# Patient Record
Sex: Male | Born: 1945
Health system: Southern US, Community
[De-identification: ages and names within clinical notes are randomized; demographics above are authoritative.]

## PROBLEM LIST (undated history)

## (undated) DIAGNOSIS — I10 Essential (primary) hypertension: Secondary | ICD-10-CM

## (undated) DIAGNOSIS — F329 Major depressive disorder, single episode, unspecified: Secondary | ICD-10-CM

## (undated) DIAGNOSIS — G43909 Migraine, unspecified, not intractable, without status migrainosus: Secondary | ICD-10-CM

## (undated) DIAGNOSIS — F32A Depression, unspecified: Secondary | ICD-10-CM

## (undated) DIAGNOSIS — B029 Zoster without complications: Secondary | ICD-10-CM

## (undated) DIAGNOSIS — H8109 Meniere's disease, unspecified ear: Secondary | ICD-10-CM

## (undated) DIAGNOSIS — E7800 Pure hypercholesterolemia, unspecified: Secondary | ICD-10-CM

## (undated) HISTORY — DX: Depression, unspecified: F32.A

## (undated) HISTORY — PX: COLONOSCOPY: SHX174

## (undated) HISTORY — DX: Zoster without complications: B02.9

## (undated) HISTORY — DX: Meniere's disease, unspecified ear: H81.09

## (undated) HISTORY — DX: Major depressive disorder, single episode, unspecified: F32.9

## (undated) HISTORY — DX: Migraine, unspecified, not intractable, without status migrainosus: G43.909

---

## 2005-08-23 DIAGNOSIS — G43909 Migraine, unspecified, not intractable, without status migrainosus: Secondary | ICD-10-CM | POA: Insufficient documentation

## 2005-08-23 DIAGNOSIS — F329 Major depressive disorder, single episode, unspecified: Secondary | ICD-10-CM | POA: Insufficient documentation

## 2005-08-23 DIAGNOSIS — J309 Allergic rhinitis, unspecified: Secondary | ICD-10-CM | POA: Insufficient documentation

## 2005-08-23 DIAGNOSIS — N529 Male erectile dysfunction, unspecified: Secondary | ICD-10-CM | POA: Insufficient documentation

## 2005-11-05 ENCOUNTER — Emergency Department: Payer: Self-pay | Admitting: Internal Medicine

## 2006-01-04 DIAGNOSIS — F410 Panic disorder [episodic paroxysmal anxiety] without agoraphobia: Secondary | ICD-10-CM | POA: Insufficient documentation

## 2006-09-25 HISTORY — PX: SIGMOIDOSCOPY: SUR1295

## 2007-04-03 ENCOUNTER — Ambulatory Visit: Payer: Self-pay | Admitting: Family Medicine

## 2008-10-04 DIAGNOSIS — M75100 Unspecified rotator cuff tear or rupture of unspecified shoulder, not specified as traumatic: Secondary | ICD-10-CM | POA: Insufficient documentation

## 2009-08-02 DIAGNOSIS — M76899 Other specified enthesopathies of unspecified lower limb, excluding foot: Secondary | ICD-10-CM | POA: Insufficient documentation

## 2013-08-11 ENCOUNTER — Ambulatory Visit: Payer: Self-pay | Admitting: Gastroenterology

## 2013-08-11 LAB — HM COLONOSCOPY

## 2014-08-24 LAB — BASIC METABOLIC PANEL: Glucose: 89 mg/dL

## 2014-10-19 LAB — LIPID PANEL
Cholesterol: 137 mg/dL (ref 0–200)
HDL: 42 mg/dL (ref 35–70)
LDL Cholesterol: 72 mg/dL
TRIGLYCERIDES: 114 mg/dL (ref 40–160)

## 2014-10-19 LAB — HEPATIC FUNCTION PANEL: ALT: 11 U/L (ref 10–40)

## 2015-04-13 ENCOUNTER — Other Ambulatory Visit: Payer: Self-pay | Admitting: Family Medicine

## 2015-07-16 DIAGNOSIS — B029 Zoster without complications: Secondary | ICD-10-CM | POA: Insufficient documentation

## 2015-07-16 HISTORY — DX: Zoster without complications: B02.9

## 2015-07-19 ENCOUNTER — Encounter: Payer: Self-pay | Admitting: Family Medicine

## 2015-07-19 ENCOUNTER — Ambulatory Visit (INDEPENDENT_AMBULATORY_CARE_PROVIDER_SITE_OTHER): Payer: PPO | Admitting: Family Medicine

## 2015-07-19 VITALS — BP 116/80 | HR 82 | Temp 98.4°F | Resp 16 | Wt 186.0 lb

## 2015-07-19 DIAGNOSIS — Z23 Encounter for immunization: Secondary | ICD-10-CM | POA: Diagnosis not present

## 2015-07-19 DIAGNOSIS — E785 Hyperlipidemia, unspecified: Secondary | ICD-10-CM

## 2015-07-19 DIAGNOSIS — F410 Panic disorder [episodic paroxysmal anxiety] without agoraphobia: Secondary | ICD-10-CM

## 2015-07-19 DIAGNOSIS — F329 Major depressive disorder, single episode, unspecified: Secondary | ICD-10-CM

## 2015-07-19 DIAGNOSIS — Z125 Encounter for screening for malignant neoplasm of prostate: Secondary | ICD-10-CM

## 2015-07-19 DIAGNOSIS — F32A Depression, unspecified: Secondary | ICD-10-CM

## 2015-07-19 DIAGNOSIS — Z0000 Encounter for general adult medical examination without abnormal findings: Secondary | ICD-10-CM

## 2015-07-19 NOTE — Patient Instructions (Signed)
   Please contact your eyecare professional to schedule a routine eye exam  

## 2015-07-19 NOTE — Progress Notes (Signed)
Patient: Cameron King, Male    DOB: 1946/07/27, 69 y.o.   MRN: [id number] Visit Date: 07/19/2015  Today's Provider: Lelon Huh, MD   Chief Complaint  Patient presents with  . Hyperlipidemia  . Depression  . Panic Attack  . Annual Exam   Subjective:    Annual exam TIN ENGRAM is a 69 y.o. male. He feels well. He reports exercising yes/walking. He reports he is sleeping well.  -----------------------------------------------------------    Follow-up for depression and panic attacks He states since increasing dose of paroxetine to 40mg  he has been doing much better, not having any panic attacks, and not feeling depressed. He is handling stress situations well.    Lipid/Cholesterol, Follow-up:   Last seen for this9 months ago.  Management changes since that visit include none. . Last Lipid Panel:    Component Value Date/Time   CHOL 137 10/19/2014   TRIG 114 10/19/2014   HDL 42 10/19/2014   LDLCALC 72 10/19/2014      He reports good compliance with atorvastatin. He is not having side effects.  Current symptoms include none and have been stable. Weight trend: stable Prior visit with dietician: no Current diet: well balanced Current exercise: walking  Wt Readings from Last 3 Encounters:  07/19/15 186 lb (84.369 kg)  10/19/14 183 lb (83.008 kg)    -------------------------------------------------------------------   Review of Systems  Constitutional: Negative.   HENT: Positive for hearing loss.        Right ear  Eyes: Negative.   Respiratory: Negative.   Cardiovascular: Negative.   Gastrointestinal: Positive for diarrhea and blood in stool.       Hemorrhoids   Endocrine: Negative.   Genitourinary: Positive for urgency.       Urgency and leakage has been going on for the last year   Musculoskeletal: Negative.   Skin: Negative.   Allergic/Immunologic: Negative.   Neurological: Negative.   Hematological: Negative.     Psychiatric/Behavioral: The patient is nervous/anxious.     Social History   Social History  . Marital Status: Married    Spouse Name: N/A  . Number of Children: N/A  . Years of Education: N/A   Occupational History  . Unemployed    Social History Main Topics  . Smoking status: Former Research scientist (life sciences)  . Smokeless tobacco: Not on file  . Alcohol Use: No  . Drug Use: Not on file  . Sexual Activity: Not on file   Other Topics Concern  . Not on file   Social History Narrative    Patient Active Problem List   Diagnosis Date Noted  . Enthesopathy of hip 08/02/2009  . Rotator cuff syndrome 10/04/2008  . Chronic infection of sinus 03/30/2008  . HLD (hyperlipidemia) 04/09/2007  . Panic disorder 01/04/2006  . History of tobacco use 11/23/2005  . Allergic rhinitis 08/23/2005  . Depressive disorder 08/23/2005  . Impotence of organic origin 08/23/2005  . Migraine without status migrainosus 08/23/2005    Past Surgical History  Procedure Laterality Date  . Sigmoidoscopy  2008    Dr. Jamal Collin. Normal per patient report    His family history is not on file. He was adopted.    Previous Medications   LORATADINE (CLARITIN) 10 MG TABLET    Take 1 tablet by mouth daily.   PAROXETINE (PAXIL) 40 MG TABLET    TAKE ONE TABLET BY MOUTH ONCE DAILY   PRAVASTATIN (PRAVACHOL) 20 MG TABLET    Take 1 tablet by  mouth at bedtime.   SILDENAFIL (VIAGRA) 100 MG TABLET    Take 1 tablet by mouth daily as needed.   SUMATRIPTAN (IMITREX) 100 MG TABLET    Take 1 tablet by mouth as needed. For headache. Repeat 2nd dose after 2 hours    Patient Care Team: Birdie Sons, MD as PCP - General (Family Medicine) Paulino Bellow, MD (General Surgery)     Objective:   Vitals: BP 116/80 mmHg  Pulse 82  Temp(Src) 98.4 F (36.9 C) (Oral)  Resp 16  Wt 186 lb (84.369 kg)  SpO2 99%  Physical Exam   General Appearance:    Alert, cooperative, no distress, appears stated age, obese  Head:    Normocephalic,  without obvious abnormality, atraumatic  Eyes:    PERRL, conjunctiva/corneas clear, EOM's intact, fundi    benign, both eyes       Ears:    Normal TM's and external ear canals, both ears  Nose:   Nares normal, septum midline, mucosa normal, no drainage   or sinus tenderness  Throat:   Lips, mucosa, and tongue normal; teeth and gums normal  Neck:   Supple, symmetrical, trachea midline, no adenopathy;       thyroid:  No enlargement/tenderness/nodules; no carotid   bruit or JVD  Back:     Symmetric, no curvature, ROM normal, no CVA tenderness  Lungs:     Clear to auscultation bilaterally, respirations unlabored  Chest wall:    No tenderness or deformity  Heart:    Regular rate and rhythm, S1 and S2 normal, no murmur, rub   or gallop  Abdomen:     Soft, non-tender, bowel sounds active all four quadrants,    no masses, no organomegaly  Genitalia:    deferred  Rectal:    deferred  Extremities:   Extremities normal, atraumatic, no cyanosis or edema  Pulses:   2+ and symmetric all extremities  Skin:   Skin color, texture, turgor normal, no rashes or lesions  Lymph nodes:   Cervical, supraclavicular, and axillary nodes normal  Neurologic:   CNII-XII intact. Normal strength, sensation and reflexes      throughout     Activities of Daily Living In your present state of health, do you have any difficulty performing the following activities: 07/19/2015  Hearing? Y  Vision? N  Difficulty concentrating or making decisions? Y  Walking or climbing stairs? Y  Dressing or bathing? N  Doing errands, shopping? N    Fall Risk Assessment Fall Risk  07/19/2015  Falls in the past year? Yes  Number falls in past yr: 2 or more  Injury with Fall? No  He states he twisted his right knee several months ago and since then has had about 5 episodes when his knee gave out while in the process of standing up causing him to fall, but this is improving.    Depression Screen PHQ 2/9 Scores 07/19/2015  PHQ -  2 Score 2    Cognitive Testing - 6-CIT Patient refused   Audit-C Alcohol Use Screening  Question Answer Points  How often do you have alcoholic drink? never 0  On days you do drink alcohol, how many drinks do you typically consume? n/a 0  How oftey will you drink 6 or more in a total? never 0  Total Score:  0   A score of 3 or more in women, and 4 or more in men indicates increased risk for alcohol abuse, EXCEPT if all  of the points are from question 1.     Assessment & Plan:    Annual Physical  Reviewed patient's Family Medical History Reviewed and updated list of patient's medical providers Assessment of cognitive impairment was done Assessed patient's functional ability Established a written schedule for health screening [employer] Completed and Reviewed  Exercise Activities and Dietary recommendations Goals    None      Immunization History  Administered Date(s) Administered  . Pneumococcal Conjugate-13 08/24/2014  . Pneumococcal Polysaccharide-23 05/01/2012  . Td 09/26/2003    Health Maintenance  Topic Date Due  . ZOSTAVAX  01/31/2007  . TETANUS/TDAP  09/25/2013  . INFLUENZA VACCINE  04/26/2015  . COLONOSCOPY  08/11/2018  . Hepatitis C Screening  Completed  . PNA vac Low Risk Adult  Completed      Discussed health benefits of physical activity, and encouraged him to engage in regular exercise appropriate for his age and condition.    ------------------------------------------------------------------------------------------------------------ 1. Annual physical exam   2. HLD (hyperlipidemia) He is tolerating atorvastatin well with no adverse effects.   - Lipid panel  3. Panic disorder Well controlled on paroxetine  4. Depressive disorder Doing well on paroxetine - Renal function panel  5. Need for influenza vaccination  - Flu vaccine HIGH DOSE PF  6. Need for Tdap vaccination  - Tdap vaccine greater than or equal to  7yo IM  7. Prostate cancer screening  - PSA

## 2015-07-20 LAB — RENAL FUNCTION PANEL
ALBUMIN: 4.8 g/dL (ref 3.6–4.8)
BUN/Creatinine Ratio: 14 (ref 10–22)
BUN: 15 mg/dL (ref 8–27)
CO2: 20 mmol/L (ref 18–29)
CREATININE: 1.11 mg/dL (ref 0.76–1.27)
Calcium: 9.8 mg/dL (ref 8.6–10.2)
Chloride: 99 mmol/L (ref 97–106)
GFR calc Af Amer: 78 mL/min/{1.73_m2} (ref >59)
GFR, EST NON AFRICAN AMERICAN: 68 mL/min/{1.73_m2} (ref >59)
Glucose: 83 mg/dL (ref 65–99)
PHOSPHORUS: 2.3 mg/dL — AB (ref 2.5–4.5)
POTASSIUM: 3.9 mmol/L (ref 3.5–5.2)
Sodium: 141 mmol/L (ref 136–144)

## 2015-07-20 LAB — LIPID PANEL
Chol/HDL Ratio: 5.2 ratio units — ABNORMAL HIGH (ref 0.0–5.0)
Cholesterol, Total: 215 mg/dL — ABNORMAL HIGH (ref 100–199)
HDL: 41 mg/dL (ref >39)
LDL Calculated: 137 mg/dL — ABNORMAL HIGH (ref 0–99)
Triglycerides: 186 mg/dL — ABNORMAL HIGH (ref 0–149)
VLDL Cholesterol Cal: 37 mg/dL (ref 5–40)

## 2015-07-20 LAB — PSA: PROSTATE SPECIFIC AG, SERUM: 1.6 ng/mL (ref 0.0–4.0)

## 2015-07-21 ENCOUNTER — Telehealth: Payer: Self-pay | Admitting: Family Medicine

## 2015-07-21 MED ORDER — PRAVASTATIN SODIUM 40 MG PO TABS: 40.0000 mg | ORAL_TABLET | Freq: Every day | ORAL | Status: DC

## 2015-07-21 NOTE — Telephone Encounter (Signed)
Patient notified of results. Patient expressed understanding. Rx sent to pharmacy.  

## 2015-07-21 NOTE — Telephone Encounter (Signed)
Pt is returning call.  CB#[phone]/MW

## 2015-07-21 NOTE — Telephone Encounter (Signed)
-----   Message from Birdie Sons, MD sent at 07/20/2015  8:01 AM EDT ----- Cholesterol is back up from 137 in January to 215 now. Need to increase pravastatin to 40mg  once a day. Other labs are normal. Follow up 6 months.

## 2015-12-17 ENCOUNTER — Other Ambulatory Visit: Payer: Self-pay | Admitting: Family Medicine

## 2015-12-24 ENCOUNTER — Ambulatory Visit (INDEPENDENT_AMBULATORY_CARE_PROVIDER_SITE_OTHER): Payer: PPO | Admitting: Family Medicine

## 2015-12-24 ENCOUNTER — Encounter: Payer: Self-pay | Admitting: Family Medicine

## 2015-12-24 VITALS — BP 120/80 | HR 78 | Temp 98.4°F | Resp 16 | Wt 201.0 lb

## 2015-12-24 DIAGNOSIS — R208 Other disturbances of skin sensation: Secondary | ICD-10-CM | POA: Diagnosis not present

## 2015-12-24 DIAGNOSIS — R200 Anesthesia of skin: Secondary | ICD-10-CM

## 2015-12-24 DIAGNOSIS — E785 Hyperlipidemia, unspecified: Secondary | ICD-10-CM

## 2015-12-24 DIAGNOSIS — F410 Panic disorder [episodic paroxysmal anxiety] without agoraphobia: Secondary | ICD-10-CM | POA: Diagnosis not present

## 2015-12-24 MED ORDER — PAROXETINE HCL 20 MG PO TABS: 20.0000 mg | ORAL_TABLET | Freq: Every day | ORAL | Status: DC

## 2015-12-24 NOTE — Progress Notes (Signed)
Patient: Cameron King Male    DOB: Feb 04, 1946   70 y.o.   MRN: [id number] Visit Date: 12/24/2015  Today's Provider: Lelon Huh, MD   Chief Complaint  Patient presents with  . Numbness   Subjective:    HPI  Numbness in both hands since January 2017. Bilateral hands are numb all time. No injury mechanism. No neck or shoulder pain. States he spent 2 weeks working in the cold in January. States he had some plumbing problems and hands became soaked with Drano. Hands became swollen with mild blistering for about 2 weeks.  Hands and fingers do not sting or burn. He tried cutting back on paroxetine and pravastatin which didn't help. Has since gone back up to 40mg  of pravastatin.   He states he did poorly when he attempted to stop paroxetine in February, but has since start back on 1/2 of of 40mg  tablet daily and is doing very well with very little anxiety and no panic attacks.    Lipid/Cholesterol, Follow-up:   Last seen for this5 months ago.  Management changes since that visit include increasing pravastatin to 40mg  which he has been tolerating well, and taking consistently. . Last Lipid Panel:    Component Value Date/Time   CHOL 215* 07/19/2015 1035   CHOL 137 10/19/2014   TRIG 186* 07/19/2015 1035   HDL 41 07/19/2015 1035   HDL 42 10/19/2014   CHOLHDL 5.2* 07/19/2015 1035   LDLCALC 137* 07/19/2015 1035   LDLCALC 72 10/19/2014    Wt Readings from Last 3 Encounters:  12/24/15 201 lb (91.173 kg)  07/19/15 186 lb (84.369 kg)  10/19/14 183 lb (83.008 kg)    -------------------------------------------------------------------    No Known Allergies Previous Medications   LORATADINE (CLARITIN) 10 MG TABLET    Take 1 tablet by mouth daily.   PAROXETINE (PAXIL) 40 MG TABLET    TAKE ONE TABLET BY MOUTH ONCE DAILY   PRAVASTATIN (PRAVACHOL) 40 MG TABLET    TAKE ONE TABLET BY MOUTH ONCE DAILY   SILDENAFIL (VIAGRA) 100 MG TABLET    Take 1 tablet by mouth daily as needed.   SUMATRIPTAN (IMITREX) 100 MG TABLET    Take 1 tablet by mouth as needed. For headache. Repeat 2nd dose after 2 hours    Review of Systems  Constitutional: Negative for fever, chills and appetite change.  Respiratory: Negative for chest tightness, shortness of breath and wheezing.   Cardiovascular: Negative for chest pain and palpitations.  Gastrointestinal: Negative for nausea, vomiting and abdominal pain.    Social History  Substance Use Topics  . Smoking status: Former Research scientist (life sciences)  . Smokeless tobacco: Not on file  . Alcohol Use: No   Objective:   BP 120/80 mmHg  Pulse 78  Temp(Src) 98.4 F (36.9 C) (Oral)  Resp 16  Wt 201 lb (91.173 kg)  SpO2 98%  Physical Exam  General appearance: alert, well developed, well nourished, cooperative and in no distress Head: Normocephalic, without obvious abnormality, atraumatic Lungs: Respirations even and unlabored Extremities: No gross deformities Skin: Skin color, texture, turgor normal. No rashes seen  Psych: Appropriate mood and affect. Neurologic: Mental status: Alert, oriented to person, place, and time, thought content appropriate. Somasensation palms and dorsal aspect of hands intact, but diminished Vasc: +2 radial and ulnar pulses, cap refill about 1 second.      Assessment & Plan:     1. Numbness of fingers of both hands Starting after prolonged cold and chemical  exposure to hands in January and slowly improving. No other lesion of hands at this time.  - Comprehensive metabolic panel - CBC - Vitamin B12  2. Panic disorder Doing will on 20mg  paroxetine - PARoxetine (PAXIL) 20 MG tablet; Take 1 tablet (20 mg total) by mouth daily.  Dispense: 90 tablet; Refill: 4  3. HLD (hyperlipidemia) He is tolerating pravastatin well with no adverse effects.   - Lipid panel       Lelon Huh, MD  [facility]

## 2015-12-25 LAB — COMPREHENSIVE METABOLIC PANEL
ALBUMIN: 4.8 g/dL (ref 3.6–4.8)
ALK PHOS: 155 IU/L — AB (ref 39–117)
ALT: 22 IU/L (ref 0–44)
AST: 22 IU/L (ref 0–40)
Albumin/Globulin Ratio: 2.5 — ABNORMAL HIGH (ref 1.2–2.2)
BILIRUBIN TOTAL: 1.2 mg/dL (ref 0.0–1.2)
BUN / CREAT RATIO: 16 (ref 10–22)
BUN: 16 mg/dL (ref 8–27)
CHLORIDE: 99 mmol/L (ref 96–106)
CO2: 25 mmol/L (ref 18–29)
CREATININE: 1 mg/dL (ref 0.76–1.27)
Calcium: 9.8 mg/dL (ref 8.6–10.2)
GFR calc Af Amer: 89 mL/min/{1.73_m2} (ref >59)
GFR calc non Af Amer: 77 mL/min/{1.73_m2} (ref >59)
GLUCOSE: 96 mg/dL (ref 65–99)
Globulin, Total: 1.9 g/dL (ref 1.5–4.5)
Potassium: 4.4 mmol/L (ref 3.5–5.2)
Sodium: 141 mmol/L (ref 134–144)
Total Protein: 6.7 g/dL (ref 6.0–8.5)

## 2015-12-25 LAB — LIPID PANEL
Chol/HDL Ratio: 5.6 ratio units — ABNORMAL HIGH (ref 0.0–5.0)
Cholesterol, Total: 192 mg/dL (ref 100–199)
HDL: 34 mg/dL — AB (ref >39)
LDL Calculated: 117 mg/dL — ABNORMAL HIGH (ref 0–99)
TRIGLYCERIDES: 206 mg/dL — AB (ref 0–149)
VLDL CHOLESTEROL CAL: 41 mg/dL — AB (ref 5–40)

## 2015-12-25 LAB — CBC
HEMATOCRIT: 46.6 % (ref 37.5–51.0)
Hemoglobin: 16.2 g/dL (ref 12.6–17.7)
MCH: 29.3 pg (ref 26.6–33.0)
MCHC: 34.8 g/dL (ref 31.5–35.7)
MCV: 84 fL (ref 79–97)
Platelets: 207 10*3/uL (ref 150–379)
RBC: 5.52 x10E6/uL (ref 4.14–5.80)
RDW: 13.6 % (ref 12.3–15.4)
WBC: 8.2 10*3/uL (ref 3.4–10.8)

## 2015-12-25 LAB — VITAMIN B12: VITAMIN B 12: 344 pg/mL (ref 211–946)

## 2016-08-03 ENCOUNTER — Ambulatory Visit (INDEPENDENT_AMBULATORY_CARE_PROVIDER_SITE_OTHER): Payer: PPO

## 2016-08-03 DIAGNOSIS — Z23 Encounter for immunization: Secondary | ICD-10-CM | POA: Diagnosis not present

## 2016-08-29 ENCOUNTER — Telehealth: Payer: Self-pay | Admitting: Family Medicine

## 2016-08-29 NOTE — Telephone Encounter (Signed)
Called Pt to schedule AWV with [facility] - knb °

## 2016-08-29 NOTE — Telephone Encounter (Signed)
Pt returned call and scheduled AWV with [facility] on 09/21/16. Pt will schedule his CPE or F/U visit with Dr. Caryn Section the day of his AWV if needed. Thanks TNP

## 2016-09-21 ENCOUNTER — Ambulatory Visit (INDEPENDENT_AMBULATORY_CARE_PROVIDER_SITE_OTHER): Payer: PPO

## 2016-09-21 VITALS — BP 126/83 | HR 80 | Temp 98.2°F | Ht 67.0 in | Wt 186.0 lb

## 2016-09-21 DIAGNOSIS — Z0000 Encounter for general adult medical examination without abnormal findings: Secondary | ICD-10-CM | POA: Diagnosis not present

## 2016-09-21 NOTE — Progress Notes (Signed)
Subjective:   Cameron King is a 70 y.o. male who presents for Medicare Annual/Subsequent preventive examination.  Review of Systems:  N/A  Cardiac Risk Factors include: advanced age (>28men, >59 women);dyslipidemia;male gender     Objective:    Vitals: BP 126/83 (BP Location: Right Arm)   Pulse 80   Temp 98.2 F (36.8 C) (Oral)   Ht 5\' 7"  (1.702 m)   Wt 186 lb (84.4 kg)   BMI 29.13 kg/m   Body mass index is 29.13 kg/m.  Tobacco History  Smoking Status  . Former Smoker  . Types: Cigarettes  Smokeless Tobacco  . Never Used    Comment: quit over 30 years ago     Counseling given: Not Answered   Past Medical History:  Diagnosis Date  . Depression   . Hyperlipidemia   . Migraine   . Panic disorder   . Shingles 07/16/2015   of eye    Past Surgical History:  Procedure Laterality Date  . SIGMOIDOSCOPY  2008   Dr. Jamal Collin. Normal per patient report   Family History  Problem Relation Age of Onset  . Adopted: Yes   History  Sexual Activity  . Sexual activity: Not on file    Outpatient Encounter Prescriptions as of 09/21/2016  Medication Sig  . loratadine (CLARITIN) 10 MG tablet Take 1 tablet by mouth daily.  . Multiple Vitamins-Minerals (EQ COMPLETE MULTIVITAMIN-ADULT PO) Take by mouth.  [name] Kitchen PARoxetine (PAXIL) 20 MG tablet Take 1 tablet (20 mg total) by mouth daily.  . pravastatin (PRAVACHOL) 40 MG tablet TAKE ONE TABLET BY MOUTH ONCE DAILY  . sildenafil (VIAGRA) 100 MG tablet Take 1 tablet by mouth daily as needed.  . SUMAtriptan (IMITREX) 100 MG tablet Take 1 tablet by mouth as needed. For headache. Repeat 2nd dose after 2 hours   No facility-administered encounter medications on file as of 09/21/2016.     Activities of Daily Living In your present state of health, do you have any difficulty performing the following activities: 09/21/2016  Hearing? N  Vision? N  Difficulty concentrating or making decisions? N  Walking or climbing stairs? Y    Dressing or bathing? N  Doing errands, shopping? N  Preparing Food and eating ? N  Using the Toilet? N  In the past six months, have you accidently leaked urine? N  Do you have problems with loss of bowel control? N  Managing your Medications? N  Managing your Finances? N  Housekeeping or managing your Housekeeping? N  Some recent data might be hidden    Patient Care Team: Birdie Sons, MD as PCP - General (Family Medicine)   Assessment:     Exercise Activities and Dietary recommendations Current Exercise Habits: Home exercise routine, Type of exercise: walking, Time (Minutes): 60, Frequency (Times/Week): 2, Weekly Exercise (Minutes/Week): 120, Intensity: Mild  Goals    . Increase water intake          Starting 09/21/16, I will increase my water intake to 5-6 glasses a day.      Fall Risk Fall Risk  09/21/2016 07/19/2015  Falls in the past year? Yes Yes  Number falls in past yr: 1 2 or more  Injury with Fall? No No  Follow up Falls prevention discussed -   Depression Screen PHQ 2/9 Scores 09/21/2016 07/19/2015  PHQ - 2 Score 1 2    Cognitive Function    Pt DECLINED 6cit screening.    Immunization History  Administered Date(s) Administered  .  Influenza, High Dose Seasonal PF 07/19/2015, 08/03/2016  . Pneumococcal Conjugate-13 08/24/2014  . Pneumococcal Polysaccharide-23 05/01/2012  . Td 09/26/2003  . Tdap 07/19/2015   Screening Tests Health Maintenance  Topic Date Due  . COLONOSCOPY  08/11/2018  . TETANUS/TDAP  07/18/2025  . INFLUENZA VACCINE  Completed  . ZOSTAVAX  Completed  . Hepatitis C Screening  Completed  . PNA vac Low Risk Adult  Completed      Plan:  I have personally reviewed and addressed the Medicare Annual Wellness questionnaire and have noted the following in the patient's chart:  A. Medical and social history B. Use of alcohol, tobacco or illicit drugs  C. Current medications and supplements D. Functional ability and status E.   Nutritional status F.  Physical activity G. Advance directives H. List of other physicians I.  Hospitalizations, surgeries, and ER visits in previous 12 months J.  Dwale such as hearing and vision if needed, cognitive and depression L. Referrals and appointments - none  In addition, I have reviewed and discussed with patient certain preventive protocols, quality metrics, and best practice recommendations. A written personalized care plan for preventive services as well as general preventive health recommendations were provided to patient.  See attached scanned questionnaire for additional information.   Signed,  Fabio Neighbors, LPN Nurse Health Advisor   MD Recommendations: Pt to check records for Zostavax administer date. Follow up on this once pt returns for AWV.  I have reviewed the health advisor's note, was available for consultation, and agree with documentation and plan  Lelon Huh, MD

## 2016-09-21 NOTE — Patient Instructions (Signed)
Cameron King , Thank you for taking time to come for your Medicare Wellness Visit. I appreciate your ongoing commitment to your health goals. Please review the following plan we discussed and let me know if I can assist you in the future.   These are the goals we discussed: Goals    . Increase water intake          Starting 09/21/16, I will increase my water intake to 5-6 glasses a day.       This is a list of the screening recommended for you and due dates:  Health Maintenance  Topic Date Due  . Shingles Vaccine  01/31/2007  . Colon Cancer Screening  08/11/2018  . Tetanus Vaccine  07/18/2025  . Flu Shot  Completed  .  Hepatitis C: One time screening is recommended by Center for Disease Control  (CDC) for  adults born from [birth year] through 1964.   Completed  . Pneumonia vaccines  Completed   Preventive Care for Adults  A healthy lifestyle and preventive care can promote health and wellness. Preventive health guidelines for adults include the following key practices.  . A routine yearly physical is a good way to check with your health care provider about your health and preventive screening. It is a chance to share any concerns and updates on your health and to receive a thorough exam.  . Visit your dentist for a routine exam and preventive care every 6 months. Brush your teeth twice a day and floss once a day. Good oral hygiene prevents tooth decay and gum disease.  . The frequency of eye exams is based on your age, health, family medical history, use  of contact lenses, and other factors. Follow your health care provider's ecommendations for frequency of eye exams.  . Eat a healthy diet. Foods like vegetables, fruits, whole grains, low-fat dairy products, and lean protein foods contain the nutrients you need without too many calories. Decrease your intake of foods high in solid fats, added sugars, and salt. Eat the right amount of calories for you. Get information about a proper diet  from your health care provider, if necessary.  . Regular physical exercise is one of the most important things you can do for your health. Most adults should get at least 150 minutes of moderate-intensity exercise (any activity that increases your heart rate and causes you to sweat) each week. In addition, most adults need muscle-strengthening exercises on 2 or more days a week.  Silver Sneakers may be a benefit available to you. To determine eligibility, you may visit the website: www.silversneakers.com or contact program at 640-146-0583 Mon-Fri between 8AM-8PM.   . Maintain a healthy weight. The body mass index (BMI) is a screening tool to identify possible weight problems. It provides an estimate of body fat based on height and weight. Your health care provider can find your BMI and can help you achieve or maintain a healthy weight.   For adults 20 years and older: ? A BMI below 18.5 is considered underweight. ? A BMI of 18.5 to 24.9 is normal. ? A BMI of 25 to 29.9 is considered overweight. ? A BMI of 30 and above is considered obese.   . Maintain normal blood lipids and cholesterol levels by exercising and minimizing your intake of saturated fat. Eat a balanced diet with plenty of fruit and vegetables. Blood tests for lipids and cholesterol should begin at age 46 and be repeated every 5 years. If your lipid or cholesterol  levels are high, you are over 50, or you are at high risk for heart disease, you Glauser need your cholesterol levels checked more frequently. Ongoing high lipid and cholesterol levels should be treated with medicines if diet and exercise are not working.  . If you smoke, find out from your health care provider how to quit. If you do not use tobacco, please do not start.  . If you choose to drink alcohol, please do not consume more than 2 drinks per day. One drink is considered to be 12 ounces (355 mL) of beer, 5 ounces (148 mL) of wine, or 1.5 ounces (44 mL) of liquor.  .  If you are 89-30 years old, ask your health care provider if you should take aspirin to prevent strokes.  . Use sunscreen. Apply sunscreen liberally and repeatedly throughout the day. You should seek shade when your shadow is shorter than you. Protect yourself by wearing long sleeves, pants, a wide-brimmed hat, and sunglasses year round, whenever you are outdoors.  . Once a month, do a whole body skin exam, using a mirror to look at the skin on your back. Tell your health care provider of new moles, moles that have irregular borders, moles that are larger than a pencil eraser, or moles that have changed in shape or color.

## 2016-09-28 ENCOUNTER — Ambulatory Visit: Payer: Self-pay | Admitting: Family Medicine

## 2016-10-02 ENCOUNTER — Ambulatory Visit (INDEPENDENT_AMBULATORY_CARE_PROVIDER_SITE_OTHER): Payer: PPO | Admitting: Family Medicine

## 2016-10-02 ENCOUNTER — Encounter: Payer: Self-pay | Admitting: Family Medicine

## 2016-10-02 VITALS — BP 120/74 | HR 96 | Temp 98.6°F | Resp 16 | Wt 185.0 lb

## 2016-10-02 DIAGNOSIS — L819 Disorder of pigmentation, unspecified: Secondary | ICD-10-CM | POA: Diagnosis not present

## 2016-10-02 DIAGNOSIS — G8929 Other chronic pain: Secondary | ICD-10-CM | POA: Diagnosis not present

## 2016-10-02 DIAGNOSIS — F32A Depression, unspecified: Secondary | ICD-10-CM

## 2016-10-02 DIAGNOSIS — F410 Panic disorder [episodic paroxysmal anxiety] without agoraphobia: Secondary | ICD-10-CM

## 2016-10-02 DIAGNOSIS — F329 Major depressive disorder, single episode, unspecified: Secondary | ICD-10-CM | POA: Diagnosis not present

## 2016-10-02 DIAGNOSIS — E7800 Pure hypercholesterolemia, unspecified: Secondary | ICD-10-CM | POA: Diagnosis not present

## 2016-10-02 DIAGNOSIS — M25561 Pain in right knee: Secondary | ICD-10-CM | POA: Diagnosis not present

## 2016-10-02 DIAGNOSIS — G43809 Other migraine, not intractable, without status migrainosus: Secondary | ICD-10-CM

## 2016-10-02 DIAGNOSIS — Z87891 Personal history of nicotine dependence: Secondary | ICD-10-CM | POA: Diagnosis not present

## 2016-10-02 DIAGNOSIS — D229 Melanocytic nevi, unspecified: Secondary | ICD-10-CM

## 2016-10-02 DIAGNOSIS — J301 Allergic rhinitis due to pollen: Secondary | ICD-10-CM | POA: Diagnosis not present

## 2016-10-02 NOTE — Patient Instructions (Signed)
Call us back for referral to orthopedist if your knee keeps bothering you.

## 2016-10-02 NOTE — Progress Notes (Signed)
Patient: Cameron King Male    DOB: [birth year]   71 y.o.   MRN: [id number] Visit Date: 10/02/2016  Today's Provider: Lelon Huh, MD   Chief Complaint  Patient presents with  . Hyperlipidemia  . Depression   Subjective:    HPI  Lipid/Cholesterol, Follow-up:   Last seen for this10 months ago.  Management changes since that visit include no changes. . Last Lipid Panel:    Component Value Date/Time   CHOL 192 12/24/2015 0855   TRIG 206 (H) 12/24/2015 0855   HDL 34 (L) 12/24/2015 0855   CHOLHDL 5.6 (H) 12/24/2015 0855   LDLCALC 117 (H) 12/24/2015 0855     He reports good compliance with treatment. He is not having side effects.  Current symptoms include none and have been stable. Weight trend: stable Prior visit with dietician: no Current diet: well balanced Current exercise: none  Wt Readings from Last 3 Encounters:  10/02/16 185 lb (83.9 kg)  09/21/16 186 lb (84.4 kg)  12/24/15 201 lb (91.2 kg)    Depression, follow up: Patient was last seen on 12/24/2015 and no changes were made. Patient currently takes Paxil 20mg  daily.    Left foot pain: Patient reports that he has had pain for a while. Patient denies any injury. Patient reports that the pain has been more constant over the last week. He also mentions that it radiates up his left ankle.   Mole Has several moles on his back on side of chest he wants checked out.     No Known Allergies   Current Outpatient Prescriptions:  .  loratadine (CLARITIN) 10 MG tablet, Take 1 tablet by mouth daily., Disp: , Rfl:  .  Multiple Vitamins-Minerals (EQ COMPLETE MULTIVITAMIN-ADULT PO), Take by mouth., Disp: , Rfl:  .  PARoxetine (PAXIL) 20 MG tablet, Take 1 tablet (20 mg total) by mouth daily., Disp: 90 tablet, Rfl: 4 .  pravastatin (PRAVACHOL) 40 MG tablet, TAKE ONE TABLET BY MOUTH ONCE DAILY, Disp: 30 tablet, Rfl: 12 .  sildenafil (VIAGRA) 100 MG tablet, Take 1 tablet by mouth daily as needed., Disp: , Rfl:    .  SUMAtriptan (IMITREX) 100 MG tablet, Take 1 tablet by mouth as needed. For headache. Repeat 2nd dose after 2 hours, Disp: , Rfl:   Review of Systems  Constitutional: Negative.   Respiratory: Negative.   Musculoskeletal: Positive for arthralgias and joint swelling.    Social History  Substance Use Topics  . Smoking status: Former Smoker    Types: Cigarettes  . Smokeless tobacco: Never Used     Comment: quit over 30 years ago  . Alcohol use 0.0 oz/week     Comment: occasionally- wine   Objective:   BP 120/74 (BP Location: Right Arm, Patient Position: Sitting, Cuff Size: Normal)   Pulse 96   Temp 98.6 F (37 C)   Resp 16   Wt 185 lb (83.9 kg)   BMI 28.98 kg/m   Physical Exam   General Appearance:    Alert, cooperative, no distress  Eyes:    PERRL, conjunctiva/corneas clear, EOM's intact       Lungs:     Clear to auscultation bilaterally, respirations unlabored  Heart:    Regular rate and rhythm  Neurologic:   Awake, alert, oriented x 3. No apparent focal neurological           defect.   Skin:  about 1 cm hyper pigmented raised lesion right upper and lateral  thorax.         Assessment & Plan:     1. Pure hypercholesterolemia He is tolerating pravastatin well with no adverse effects.   - Lipid panel - Comprehensive metabolic panel - TSH  2. Panic disorder Doing very well on current SSRI  3. History of tobacco use Quit in the 1980s  4. Other migraine without status migrainosus, not intractable Has not required Imitrex since he retired.   5. Chronic allergic rhinitis due to pollen, unspecified seasonality Has changed to OTC loratadine which he feels is working well.   6. Depressive disorder Doing well on SSRI - Comprehensive metabolic panel  7. Chronic pain of right knee Recommended referral to orthopedics. He will call back when he is ready to pursue this.   8. Change in mole  - Ambulatory referral to Dermatology     The entirety of the  information documented in the History of Present Illness, Review of Systems and Physical Exam were personally obtained by me. Portions of this information were initially documented by Wilburt Finlay, CMA and reviewed by me for thoroughness and accuracy.    Lelon Huh, MD  [facility]

## 2016-10-02 NOTE — Progress Notes (Deleted)
Patient: Cameron King, Male    DOB: May 14, 1946, 71 y.o.   MRN: [id number] Visit Date: 10/02/2016  Today's Provider: Lelon Huh, MD   No chief complaint on file.  Subjective:    Annual wellness visit Cameron King is a 71 y.o. male. He feels {DESC; WELL/FAIRLY WELL/POORLY:18703}. He reports exercising ***. He reports he is sleeping {DESC; WELL/FAIRLY WELL/POORLY:18703}.  -----------------------------------------------------------   Review of Systems  Social History   Social History  . Marital status: Married    Spouse name: N/A  . Number of children: N/A  . Years of education: N/A   Occupational History  . Unemployed    Social History Main Topics  . Smoking status: Former Smoker    Types: Cigarettes  . Smokeless tobacco: Never Used     Comment: quit over 30 years ago  . Alcohol use 0.0 oz/week     Comment: occasionally- wine  . Drug use: No  . Sexual activity: Not on file   Other Topics Concern  . Not on file   Social History Narrative  . No narrative on file    Past Medical History:  Diagnosis Date  . Depression   . Hyperlipidemia   . Migraine   . Panic disorder   . Shingles 07/16/2015   of eye      Patient Active Problem List   Diagnosis Date Noted  . Enthesopathy of hip 08/02/2009  . Rotator cuff syndrome 10/04/2008  . Chronic infection of sinus 03/30/2008  . HLD (hyperlipidemia) 04/09/2007  . Panic disorder 01/04/2006  . History of tobacco use 11/23/2005  . Allergic rhinitis 08/23/2005  . Depressive disorder 08/23/2005  . Impotence of organic origin 08/23/2005  . Migraine without status migrainosus 08/23/2005    Past Surgical History:  Procedure Laterality Date  . SIGMOIDOSCOPY  2008   Dr. Jamal Collin. Normal per patient report    His family history is not on file. He was adopted.      Current Outpatient Prescriptions:  .  loratadine (CLARITIN) 10 MG tablet, Take 1 tablet by mouth daily., Disp: , Rfl:  .  Multiple  Vitamins-Minerals (EQ COMPLETE MULTIVITAMIN-ADULT PO), Take by mouth., Disp: , Rfl:  .  PARoxetine (PAXIL) 20 MG tablet, Take 1 tablet (20 mg total) by mouth daily., Disp: 90 tablet, Rfl: 4 .  pravastatin (PRAVACHOL) 40 MG tablet, TAKE ONE TABLET BY MOUTH ONCE DAILY, Disp: 30 tablet, Rfl: 12 .  sildenafil (VIAGRA) 100 MG tablet, Take 1 tablet by mouth daily as needed., Disp: , Rfl:  .  SUMAtriptan (IMITREX) 100 MG tablet, Take 1 tablet by mouth as needed. For headache. Repeat 2nd dose after 2 hours, Disp: , Rfl:   Patient Care Team: Birdie Sons, MD as PCP - General (Family Medicine)     Objective:   Vitals: There were no vitals taken for this visit.  Physical Exam  Activities of Daily Living In your present state of health, do you have any difficulty performing the following activities: 09/21/2016  Hearing? N  Vision? N  Difficulty concentrating or making decisions? N  Walking or climbing stairs? Y  Dressing or bathing? N  Doing errands, shopping? N  Preparing Food and eating ? N  Using the Toilet? N  In the past six months, have you accidently leaked urine? N  Do you have problems with loss of bowel control? N  Managing your Medications? N  Managing your Finances? N  Housekeeping or managing your Housekeeping? N  Some recent data might be hidden    Fall Risk Assessment Fall Risk  09/21/2016 07/19/2015  Falls in the past year? Yes Yes  Number falls in past yr: 1 2 or more  Injury with Fall? No No  Follow up Falls prevention discussed -     Depression Screen PHQ 2/9 Scores 09/21/2016 07/19/2015  PHQ - 2 Score 1 2    Cognitive Testing - 6-CIT  Correct? Score   What year is it? {yes no:22349} {0-4:31231} 0 or 4  What month is it? {yes no:22349} {0-3:21082} 0 or 3  Memorize:    Cameron King,  43,  [address]  [place],      What time is it? (within 1 hour) {yes no:22349} {0-3:21082} 0 or 3  Count backwards from 20 {yes no:22349} {0-4:31231} 0, 2, or 4  Name the  months of the year {yes no:22349} {0-4:31231} 0, 2, or 4  Repeat name & address above {yes no:22349} {0-10:5044} 0, 2, 4, 6, 8, or 10       TOTAL SCORE  ***/28   Interpretation:  {normal/abnormal:11317::"Normal"}  Normal (0-7) Abnormal (8-28)       Assessment & Plan:     Annual Wellness Visit  Reviewed patient's Family Medical History Reviewed and updated list of patient's medical providers Assessment of cognitive impairment was done Assessed patient's functional ability Established a written schedule for health screening [place] Completed and Reviewed  Exercise Activities and Dietary recommendations Goals    . Increase water intake          Starting 09/21/16, I will increase my water intake to 5-6 glasses a day.       Immunization History  Administered Date(s) Administered  . Influenza, High Dose Seasonal PF 07/19/2015, 08/03/2016  . Pneumococcal Conjugate-13 08/24/2014  . Pneumococcal Polysaccharide-23 05/01/2012  . Td 09/26/2003  . Tdap 07/19/2015    Health Maintenance  Topic Date Due  . COLONOSCOPY  08/11/2018  . TETANUS/TDAP  07/18/2025  . INFLUENZA VACCINE  Completed  . ZOSTAVAX  Completed  . Hepatitis C Screening  Completed  . PNA vac Low Risk Adult  Completed     Discussed health benefits of physical activity, and encouraged him to engage in regular exercise appropriate for his age and condition.      Lelon Huh, MD  [facility]

## 2016-10-03 LAB — COMPREHENSIVE METABOLIC PANEL
ALBUMIN: 4.8 g/dL (ref 3.6–4.8)
ALT: 26 IU/L (ref 0–44)
AST: 24 IU/L (ref 0–40)
Albumin/Globulin Ratio: 2.3 — ABNORMAL HIGH (ref 1.2–2.2)
Alkaline Phosphatase: 156 IU/L — ABNORMAL HIGH (ref 39–117)
BUN / CREAT RATIO: 20 (ref 10–24)
BUN: 20 mg/dL (ref 8–27)
Bilirubin Total: 1.5 mg/dL — ABNORMAL HIGH (ref 0.0–1.2)
CALCIUM: 9.5 mg/dL (ref 8.6–10.2)
CO2: 23 mmol/L (ref 18–29)
CREATININE: 1.02 mg/dL (ref 0.76–1.27)
Chloride: 97 mmol/L (ref 96–106)
GFR calc Af Amer: 86 mL/min/{1.73_m2} (ref >59)
GFR, EST NON AFRICAN AMERICAN: 75 mL/min/{1.73_m2} (ref >59)
GLOBULIN, TOTAL: 2.1 g/dL (ref 1.5–4.5)
Glucose: 73 mg/dL (ref 65–99)
Potassium: 4.3 mmol/L (ref 3.5–5.2)
SODIUM: 140 mmol/L (ref 134–144)
Total Protein: 6.9 g/dL (ref 6.0–8.5)

## 2016-10-03 LAB — TSH: TSH: 1.69 u[IU]/mL (ref 0.450–4.500)

## 2016-10-03 LAB — LIPID PANEL
CHOLESTEROL TOTAL: 181 mg/dL (ref 100–199)
Chol/HDL Ratio: 4.9 ratio units (ref 0.0–5.0)
HDL: 37 mg/dL — ABNORMAL LOW (ref >39)
LDL Calculated: 112 mg/dL — ABNORMAL HIGH (ref 0–99)
Triglycerides: 158 mg/dL — ABNORMAL HIGH (ref 0–149)
VLDL CHOLESTEROL CAL: 32 mg/dL (ref 5–40)

## 2016-11-07 DIAGNOSIS — L918 Other hypertrophic disorders of the skin: Secondary | ICD-10-CM | POA: Diagnosis not present

## 2016-11-07 DIAGNOSIS — L538 Other specified erythematous conditions: Secondary | ICD-10-CM | POA: Diagnosis not present

## 2016-11-07 DIAGNOSIS — D2272 Melanocytic nevi of left lower limb, including hip: Secondary | ICD-10-CM | POA: Diagnosis not present

## 2016-11-07 DIAGNOSIS — D225 Melanocytic nevi of trunk: Secondary | ICD-10-CM | POA: Diagnosis not present

## 2016-11-07 DIAGNOSIS — D2261 Melanocytic nevi of right upper limb, including shoulder: Secondary | ICD-10-CM | POA: Diagnosis not present

## 2016-11-07 DIAGNOSIS — L821 Other seborrheic keratosis: Secondary | ICD-10-CM | POA: Diagnosis not present

## 2016-11-07 DIAGNOSIS — R208 Other disturbances of skin sensation: Secondary | ICD-10-CM | POA: Diagnosis not present

## 2017-01-17 ENCOUNTER — Other Ambulatory Visit: Payer: Self-pay | Admitting: Family Medicine

## 2017-04-07 ENCOUNTER — Other Ambulatory Visit: Payer: Self-pay | Admitting: Family Medicine

## 2017-04-07 DIAGNOSIS — F410 Panic disorder [episodic paroxysmal anxiety] without agoraphobia: Secondary | ICD-10-CM

## 2017-06-28 ENCOUNTER — Ambulatory Visit (INDEPENDENT_AMBULATORY_CARE_PROVIDER_SITE_OTHER): Payer: PPO

## 2017-06-28 DIAGNOSIS — Z23 Encounter for immunization: Secondary | ICD-10-CM | POA: Diagnosis not present

## 2017-09-21 ENCOUNTER — Ambulatory Visit (INDEPENDENT_AMBULATORY_CARE_PROVIDER_SITE_OTHER): Payer: PPO

## 2017-09-21 VITALS — BP 142/80 | HR 80 | Temp 98.0°F | Ht 67.0 in | Wt 211.4 lb

## 2017-09-21 DIAGNOSIS — Z0000 Encounter for general adult medical examination without abnormal findings: Secondary | ICD-10-CM

## 2017-09-21 NOTE — Patient Instructions (Signed)
[name] , Thank you for taking time to come for your Medicare Wellness Visit. I appreciate your ongoing commitment to your health goals. Please review the following plan we discussed and let me know if I can assist you in the future.   Screening recommendations/referrals: Colonoscopy: Up to date Recommended yearly ophthalmology/optometry visit for glaucoma screening and checkup Recommended yearly dental visit for hygiene and checkup  Vaccinations: Influenza vaccine: Up to date Pneumococcal vaccine: Up to date Tdap vaccine: Up to date Shingles vaccine: Pt declines today.   Advanced directives: Please bring a copy of your POA (Power of Attorney) and/or Living Will to your next appointment.   Conditions/risks identified: Fall risk prevention; Obesity- recommend cutting portion sizes in half and eating 3 small meals a day with 2 healthy snacks in between.   Next appointment: 10/12/17  Preventive Care 70 Years and Older, Male Preventive care refers to lifestyle choices and visits with your health care provider that can promote health and wellness. What does preventive care include?  A yearly physical exam. This is also called an annual well check.  Dental exams once or twice a year.  Routine eye exams. Ask your health care provider how often you should have your eyes checked.  Personal lifestyle choices, including:  Daily care of your teeth and gums.  Regular physical activity.  Eating a healthy diet.  Avoiding tobacco and drug use.  Limiting alcohol use.  Practicing safe sex.  Taking low doses of aspirin every day.  Taking vitamin and mineral supplements as recommended by your health care provider. What happens during an annual well check? The services and screenings done by your health care provider during your annual well check will depend on your age, overall health, lifestyle risk factors, and family history of disease. Counseling  Your health care provider may  ask you questions about your:  Alcohol use.  Tobacco use.  Drug use.  Emotional well-being.  Home and relationship well-being.  Sexual activity.  Eating habits.  History of falls.  Memory and ability to understand (cognition).  Work and work Statistician. Screening  You may have the following tests or measurements:  Height, weight, and BMI.  Blood pressure.  Lipid and cholesterol levels. These may be checked every 5 years, or more frequently if you are over 70 years old.  Skin check.  Lung cancer screening. You may have this screening every year starting at age 70 if you have a 30-pack-year history of smoking and currently smoke or have quit within the past 15 years.  Fecal occult blood test (FOBT) of the stool. You may have this test every year starting at age 70.  Flexible sigmoidoscopy or colonoscopy. You may have a sigmoidoscopy every 5 years or a colonoscopy every 10 years starting at age 70.  Prostate cancer screening. Recommendations will vary depending on your family history and other risks.  Hepatitis C blood test.  Hepatitis B blood test.  Sexually transmitted disease (STD) testing.  Diabetes screening. This is done by checking your blood sugar (glucose) after you have not eaten for a while (fasting). You may have this done every 1-3 years.  Abdominal aortic aneurysm (AAA) screening. You may need this if you are a current or former smoker.  Osteoporosis. You may be screened starting at age 70 if you are at high risk. Talk with your health care provider about your test results, treatment options, and if necessary, the need for more tests. Vaccines  Your health care provider may  recommend certain vaccines, such as:  Influenza vaccine. This is recommended every year.  Tetanus, diphtheria, and acellular pertussis (Tdap, Td) vaccine. You may need a Td booster every 10 years.  Zoster vaccine. You may need this after age 70.  Pneumococcal 13-valent  conjugate (PCV13) vaccine. One dose is recommended after age 70.  Pneumococcal polysaccharide (PPSV23) vaccine. One dose is recommended after age 70. Talk to your health care provider about which screenings and vaccines you need and how often you need them. This information is not intended to replace advice given to you by your health care provider. Make sure you discuss any questions you have with your health care provider. Document Released: 10/08/2015 Document Revised: 05/31/2016 Document Reviewed: 07/13/2015 Elsevier Interactive Patient Education  2017 [place] Prevention in the Home Falls can cause injuries. They can happen to people of all ages. There are many things you can do to make your home safe and to help prevent falls. What can I do on the outside of my home?  Regularly fix the edges of walkways and driveways and fix any cracks.  Remove anything that might make you trip as you walk through a door, such as a raised step or threshold.  Trim any bushes or trees on the path to your home.  Use bright outdoor lighting.  Clear any walking paths of anything that might make someone trip, such as rocks or tools.  Regularly check to see if handrails are loose or broken. Make sure that both sides of any steps have handrails.  Any raised decks and porches should have guardrails on the edges.  Have any leaves, snow, or ice cleared regularly.  Use sand or salt on walking paths during winter.  Clean up any spills in your garage right away. This includes oil or grease spills. What can I do in the bathroom?  Use night lights.  Install grab bars by the toilet and in the tub and shower. Do not use towel bars as grab bars.  Use non-skid mats or decals in the tub or shower.  If you need to sit down in the shower, use a plastic, non-slip stool.  Keep the floor dry. Clean up any water that spills on the floor as soon as it happens.  Remove soap buildup in the tub or  shower regularly.  Attach bath mats securely with double-sided non-slip rug tape.  Do not have throw rugs and other things on the floor that can make you trip. What can I do in the bedroom?  Use night lights.  Make sure that you have a light by your bed that is easy to reach.  Do not use any sheets or blankets that are too big for your bed. They should not hang down onto the floor.  Have a firm chair that has side arms. You can use this for support while you get dressed.  Do not have throw rugs and other things on the floor that can make you trip. What can I do in the kitchen?  Clean up any spills right away.  Avoid walking on wet floors.  Keep items that you use a lot in easy-to-reach places.  If you need to reach something above you, use a strong step stool that has a grab bar.  Keep electrical cords out of the way.  Do not use floor polish or wax that makes floors slippery. If you must use wax, use non-skid floor wax.  Do not have throw rugs and  other things on the floor that can make you trip. What can I do with my stairs?  Do not leave any items on the stairs.  Make sure that there are handrails on both sides of the stairs and use them. Fix handrails that are broken or loose. Make sure that handrails are as long as the stairways.  Check any carpeting to make sure that it is firmly attached to the stairs. Fix any carpet that is loose or worn.  Avoid having throw rugs at the top or bottom of the stairs. If you do have throw rugs, attach them to the floor with carpet tape.  Make sure that you have a light switch at the top of the stairs and the bottom of the stairs. If you do not have them, ask someone to add them for you. What else can I do to help prevent falls?  Wear shoes that:  Do not have high heels.  Have rubber bottoms.  Are comfortable and fit you well.  Are closed at the toe. Do not wear sandals.  If you use a stepladder:  Make sure that it is fully  opened. Do not climb a closed stepladder.  Make sure that both sides of the stepladder are locked into place.  Ask someone to hold it for you, if possible.  Clearly mark and make sure that you can see:  Any grab bars or handrails.  First and last steps.  Where the edge of each step is.  Use tools that help you move around (mobility aids) if they are needed. These include:  Canes.  Walkers.  Scooters.  Crutches.  Turn on the lights when you go into a dark area. Replace any light bulbs as soon as they burn out.  Set up your furniture so you have a clear path. Avoid moving your furniture around.  If any of your floors are uneven, fix them.  If there are any pets around you, be aware of where they are.  Review your medicines with your doctor. Some medicines can make you feel dizzy. This can increase your chance of falling. Ask your doctor what other things that you can do to help prevent falls. This information is not intended to replace advice given to you by your health care provider. Make sure you discuss any questions you have with your health care provider. Document Released: 07/08/2009 Document Revised: 02/17/2016 Document Reviewed: 10/16/2014 Elsevier Interactive Patient Education  2017 [employer].

## 2017-09-21 NOTE — Progress Notes (Signed)
Subjective:   Cameron King is a 71 y.o. male who presents for Medicare Annual/Subsequent preventive examination.  Review of Systems:  N/A  Cardiac Risk Factors include: advanced age (>13men, >55 women);dyslipidemia;hypertension;male gender;obesity (BMI >30kg/m2)     Objective:    Vitals: BP (!) 142/80 (BP Location: Left Arm)   Pulse 80   Temp 98 F (36.7 C) (Oral)   Ht 5\' 7"  (1.702 m)   Wt 211 lb 6.4 oz (95.9 kg)   BMI 33.11 kg/m   Body mass index is 33.11 kg/m.  Advanced Directives 09/21/2017 09/21/2016  Does Patient Have a Medical Advance Directive? No No  Would patient like information on creating a medical advance directive? Yes (MAU/Ambulatory/Procedural Areas - Information given) No - Patient declined    Tobacco Social History   Tobacco Use  Smoking Status Former Smoker  . Types: Cigarettes  Smokeless Tobacco Never Used  Tobacco Comment   quit over 30 years ago     Counseling given: Not Answered Comment: quit over 30 years ago   Clinical Intake:  Pre-visit preparation completed: Yes  Pain : No/denies pain Pain Score: 0-No pain     Nutritional Status: BMI > 30  Obese Nutritional Risks: None Diabetes: No  How often do you need to have someone help you when you read instructions, pamphlets, or other written materials from your doctor or pharmacy?: 1 - Never  Interpreter Needed?: No  Information entered by :: [facility], LPN  Past Medical History:  Diagnosis Date  . Depression   . Hyperlipidemia   . Migraine   . Panic disorder   . Shingles 07/16/2015   of eye    Past Surgical History:  Procedure Laterality Date  . SIGMOIDOSCOPY  2008   Dr. Jamal Collin. Normal per patient report   Family History  Adopted: Yes   Social History   Socioeconomic History  . Marital status: Widowed    Spouse name: None  . Number of children: 2  . Years of education: None  . Highest education level: Associate degree: occupational, Hotel manager, or vocational  program  Social Needs  . Financial resource strain: Not hard at all  . Food insecurity - worry: Never true  . Food insecurity - inability: Never true  . Transportation needs - medical: No  . Transportation needs - non-medical: No  Occupational History  . Occupation: Retired    Comment: Retired 2009  Tobacco Use  . Smoking status: Former Smoker    Types: Cigarettes  . Smokeless tobacco: Never Used  . Tobacco comment: quit over 30 years ago  Substance and Sexual Activity  . Alcohol use: Yes    Alcohol/week: 0.0 oz    Comment: occasionally- wine  . Drug use: No  . Sexual activity: None  Other Topics Concern  . None  Social History Narrative   Pt currently has a girlfriend who has had a heart attack recently. This has added to his stress level.     Outpatient Encounter Medications as of 09/21/2017  Medication Sig  . hydrocortisone 2.5 % lotion Apply topically as needed.   [name] Kitchen ibuprofen (ADVIL,MOTRIN) 200 MG tablet Take 200 mg by mouth every 6 (six) hours as needed.  [name] Kitchen ketoconazole (NIZORAL) 2 % shampoo Apply 1 application topically 2 (two) times a week.  . loratadine (CLARITIN) 10 MG tablet Take 1 tablet by mouth daily.  . Multiple Vitamins-Minerals (EQ COMPLETE MULTIVITAMIN-ADULT PO) Take by mouth.  [name] Kitchen PARoxetine (PAXIL) 20 MG tablet TAKE ONE TABLET BY MOUTH  ONCE DAILY  . pravastatin (PRAVACHOL) 40 MG tablet TAKE ONE TABLET BY MOUTH ONCE DAILY  . sildenafil (VIAGRA) 100 MG tablet Take 1 tablet by mouth daily as needed.   No facility-administered encounter medications on file as of 09/21/2017.     Activities of Daily Living In your present state of health, do you have any difficulty performing the following activities: 09/21/2017 09/21/2016  Hearing? Y N  Comment Does not see an audiologist for this but would be interested in a referral to see one. -  Vision? N N  Difficulty concentrating or making decisions? Y N  Walking or climbing stairs? Y Y  Comment due to right knee  pain left foot pain  Dressing or bathing? N N  Doing errands, shopping? N N  Preparing Food and eating ? N N  Using the Toilet? N N  In the past six months, have you accidently leaked urine? N N  Do you have problems with loss of bowel control? N N  Managing your Medications? N N  Managing your Finances? N N  Housekeeping or managing your Housekeeping? N N  Some recent data might be hidden    Patient Care Team: Birdie Sons, MD as PCP - General (Family Medicine)   Assessment:   This is a routine wellness examination for Cameron King.  Exercise Activities and Dietary recommendations Current Exercise Habits: The patient does not participate in regular exercise at present, Exercise limited by: orthopedic condition(s)  Goals    . DIET - REDUCE PORTION SIZE     Recommend cutting portion sizes in half and eating 3 small meals a day with 2 healthy snacks in between.        Fall Risk Fall Risk  09/21/2017 09/21/2016 07/19/2015  Falls in the past year? Yes Yes Yes  Number falls in past yr: 2 or more 1 2 or more  Comment - - x4  Injury with Fall? (No Data) No No  Comment muscle strain didnt f/u with doctor, still in pain (left foot) -  Follow up Falls prevention discussed Falls prevention discussed -   Is the patient's home free of loose throw rugs in walkways, pet beds, electrical cords, etc?   yes      Grab bars in the bathroom? yes      Handrails on the stairs?   yes      Adequate lighting?   yes  Timed Get Up and Go Performed: N/A  Depression Screen PHQ 2/9 Scores 09/21/2017 09/21/2016 07/19/2015  PHQ - 2 Score 5 1 2   PHQ- 9 Score 18 - -   Pt states his girlfriend had a heart attack recently and this is contributing to his stress. Advised pt to schedule a f/u apt regarding score. Pt agreed to do so.   Cognitive Function: Pt declined screening today.        Immunization History  Administered Date(s) Administered  . Influenza, High Dose Seasonal PF 07/19/2015,  08/03/2016, 06/28/2017  . Pneumococcal Conjugate-13 08/24/2014  . Pneumococcal Polysaccharide-23 05/01/2012  . Td 09/26/2003  . Tdap 07/19/2015    Qualifies for Shingles Vaccine? Due for Shingles vaccine. Declined my offer to administer today. Education has been provided regarding the importance of this vaccine. Pt has been advised to call her insurance company to determine her out of pocket expense. Advised she may also receive this vaccine at her local pharmacy or Health Dept. Verbalized acceptance and understanding.  Screening Tests Health Maintenance  Topic Date Due  .  COLONOSCOPY  08/11/2018  . TETANUS/TDAP  07/18/2025  . INFLUENZA VACCINE  Completed  . Hepatitis C Screening  Completed  . PNA vac Low Risk Adult  Completed   Cancer Screenings: Lung: Low Dose CT Chest recommended if Age 26-80 years, 30 pack-year currently smoking OR have quit w/in 15years. Patient does not qualify. Colorectal: Up to date  Additional Screenings:  Hepatitis B/HIV/Syphillis: Pt declines today.  Hepatitis C Screening: Up to date    Plan:  I have personally reviewed and addressed the Medicare Annual Wellness questionnaire and have noted the following in the patient's chart:  A. Medical and social history B. Use of alcohol, tobacco or illicit drugs  C. Current medications and supplements D. Functional ability and status E.  Nutritional status F.  Physical activity G. Advance directives H. List of other physicians I.  Hospitalizations, surgeries, and ER visits in previous 12 months J.  [place] such as hearing and vision if needed, cognitive and depression L. Referrals and appointments - none  In addition, I have reviewed and discussed with patient certain preventive protocols, quality metrics, and best practice recommendations. A written personalized care plan for preventive services as well as general preventive health recommendations were provided to patient.  See attached  scanned questionnaire for additional information.   Signed,  Fabio Neighbors, LPN Nurse Health Advisor   Nurse Recommendations: Pt is interested in a referral to see an audiologist and a referral to see a physician about foot pains that he has from a previous fall. Advised pt to set up a physical to discuss these things further.

## 2017-10-11 NOTE — Progress Notes (Deleted)
Patient: Cameron King, Male    DOB: [birth year], 72 y.o.   MRN: [id number] Visit Date: 10/11/2017  Today's Provider: Lelon Huh, MD   No chief complaint on file.  Subjective:   Patient saw [facility]enzie for AWV 09/21/2017.   Annual physical exam Cameron King is a 72 y.o. male who presents today for health maintenance and complete physical. He feels {DESC; WELL/FAIRLY WELL/POORLY:18703}. He reports exercising ***. He reports he is sleeping {DESC; WELL/FAIRLY WELL/POORLY:18703}.  -----------------------------------------------------------------    Lipid/Cholesterol, Follow-up:   Last seen for this 1 years ago.  Management since that visit includes; labs checked, no changes.  Last Lipid Panel:    Component Value Date/Time   CHOL 181 10/02/2016 1014   TRIG 158 (H) 10/02/2016 1014   HDL 37 (L) 10/02/2016 1014   CHOLHDL 4.9 10/02/2016 1014   LDLCALC 112 (H) 10/02/2016 1014    He reports {excellent/good/fair/poor:19665} compliance with treatment. He {ACTION; IS/IS [id number]} having side effects. ***  Wt Readings from Last 3 Encounters:  09/21/17 211 lb 6.4 oz (95.9 kg)  10/02/16 185 lb (83.9 kg)  09/21/16 186 lb (84.4 kg)    ------------------------------------------------------------------------  Panic disorder From 10/02/2016-no changes. Doing very well on current SSRI  Depressive disorder From 10/02/2016-labs checked, no changes.    Review of Systems  Social History      He  reports that he has quit smoking. His smoking use included cigarettes. he has never used smokeless tobacco. He reports that he drinks alcohol. He reports that he does not use drugs.       Social History   Socioeconomic History  . Marital status: Widowed    Spouse name: Not on file  . Number of children: 2  . Years of education: Not on file  . Highest education level: Associate degree: occupational, Hotel manager, or vocational program  Social Needs  . Financial resource strain:  Not hard at all  . Food insecurity - worry: Never true  . Food insecurity - inability: Never true  . Transportation needs - medical: No  . Transportation needs - non-medical: No  Occupational History  . Occupation: Retired    Comment: Retired 2009  Tobacco Use  . Smoking status: Former Smoker    Types: Cigarettes  . Smokeless tobacco: Never Used  . Tobacco comment: quit over 30 years ago  Substance and Sexual Activity  . Alcohol use: Yes    Alcohol/week: 0.0 oz    Comment: occasionally- wine  . Drug use: No  . Sexual activity: Not on file  Other Topics Concern  . Not on file  Social History Narrative   Pt currently has a girlfriend who has had a heart attack recently. This has added to his stress level.     Past Medical History:  Diagnosis Date  . Depression   . Hyperlipidemia   . Migraine   . Panic disorder   . Shingles 07/16/2015   of eye      Patient Active Problem List   Diagnosis Date Noted  . Chronic pain of right knee 10/02/2016  . Enthesopathy of hip 08/02/2009  . Rotator cuff syndrome 10/04/2008  . Chronic infection of sinus 03/30/2008  . HLD (hyperlipidemia) 04/09/2007  . Panic disorder 01/04/2006  . History of tobacco use 11/23/2005  . Allergic rhinitis 08/23/2005  . Depressive disorder 08/23/2005  . Impotence of organic origin 08/23/2005  . Migraine without status migrainosus 08/23/2005    Past Surgical History:  Procedure Laterality  Date  . SIGMOIDOSCOPY  2008   Dr. Jamal Collin. Normal per patient report    Family History        No family status information on file.        His family history is not on file. He was adopted.     No Known Allergies   Current Outpatient Medications:  .  hydrocortisone 2.5 % lotion, Apply topically as needed. , Disp: , Rfl:  .  ibuprofen (ADVIL,MOTRIN) 200 MG tablet, Take 200 mg by mouth every 6 (six) hours as needed., Disp: , Rfl:  .  ketoconazole (NIZORAL) 2 % shampoo, Apply 1 application topically 2 (two)  times a week., Disp: , Rfl:  .  loratadine (CLARITIN) 10 MG tablet, Take 1 tablet by mouth daily., Disp: , Rfl:  .  Multiple Vitamins-Minerals (EQ COMPLETE MULTIVITAMIN-ADULT PO), Take by mouth., Disp: , Rfl:  .  PARoxetine (PAXIL) 20 MG tablet, TAKE ONE TABLET BY MOUTH ONCE DAILY, Disp: 90 tablet, Rfl: 4 .  pravastatin (PRAVACHOL) 40 MG tablet, TAKE ONE TABLET BY MOUTH ONCE DAILY, Disp: 30 tablet, Rfl: 9 .  sildenafil (VIAGRA) 100 MG tablet, Take 1 tablet by mouth daily as needed., Disp: , Rfl:    Patient Care Team: Birdie Sons, MD as PCP - General (Family Medicine)      Objective:   Vitals: There were no vitals taken for this visit.  There were no vitals filed for this visit.   Physical Exam   Depression Screen PHQ 2/9 Scores 09/21/2017 09/21/2016 07/19/2015  PHQ - 2 Score 5 1 2   PHQ- 9 Score 18 - -      Assessment & Plan:     Routine Health Maintenance and Physical Exam  Exercise Activities and Dietary recommendations Goals    . DIET - REDUCE PORTION SIZE     Recommend cutting portion sizes in half and eating 3 small meals a day with 2 healthy snacks in between.        Immunization History  Administered Date(s) Administered  . Influenza, High Dose Seasonal PF 07/19/2015, 08/03/2016, 06/28/2017  . Pneumococcal Conjugate-13 08/24/2014  . Pneumococcal Polysaccharide-23 05/01/2012  . Td 09/26/2003  . Tdap 07/19/2015    Health Maintenance  Topic Date Due  . COLONOSCOPY  08/11/2018  . TETANUS/TDAP  07/18/2025  . INFLUENZA VACCINE  Completed  . Hepatitis C Screening  Completed  . PNA vac Low Risk Adult  Completed     Discussed health benefits of physical activity, and encouraged him to engage in regular exercise appropriate for his age and condition.    --------------------------------------------------------------------    Lelon Huh, MD  [place]

## 2017-10-12 ENCOUNTER — Ambulatory Visit (INDEPENDENT_AMBULATORY_CARE_PROVIDER_SITE_OTHER): Payer: PPO | Admitting: Family Medicine

## 2017-10-12 ENCOUNTER — Encounter: Payer: Self-pay | Admitting: Family Medicine

## 2017-10-12 VITALS — BP 120/80 | HR 87 | Temp 97.7°F | Resp 18 | Ht 66.0 in | Wt 198.0 lb

## 2017-10-12 DIAGNOSIS — Z125 Encounter for screening for malignant neoplasm of prostate: Secondary | ICD-10-CM | POA: Diagnosis not present

## 2017-10-12 DIAGNOSIS — E7800 Pure hypercholesterolemia, unspecified: Secondary | ICD-10-CM

## 2017-10-12 DIAGNOSIS — M5431 Sciatica, right side: Secondary | ICD-10-CM

## 2017-10-12 DIAGNOSIS — Z6831 Body mass index (BMI) 31.0-31.9, adult: Secondary | ICD-10-CM | POA: Diagnosis not present

## 2017-10-12 DIAGNOSIS — F410 Panic disorder [episodic paroxysmal anxiety] without agoraphobia: Secondary | ICD-10-CM

## 2017-10-12 DIAGNOSIS — G4710 Hypersomnia, unspecified: Secondary | ICD-10-CM | POA: Diagnosis not present

## 2017-10-12 DIAGNOSIS — Z0000 Encounter for general adult medical examination without abnormal findings: Secondary | ICD-10-CM | POA: Diagnosis not present

## 2017-10-12 DIAGNOSIS — R5383 Other fatigue: Secondary | ICD-10-CM

## 2017-10-12 MED ORDER — NAPROXEN 500 MG PO TABS: 500.0000 mg | ORAL_TABLET | Freq: Two times a day (BID) | ORAL | 2 refills | Status: DC

## 2017-10-12 MED ORDER — PAROXETINE HCL 20 MG PO TABS: 40.0000 mg | ORAL_TABLET | Freq: Every day | ORAL | 0 refills | Status: DC

## 2017-10-12 NOTE — Progress Notes (Signed)
Patient: Cameron King, Male    DOB: 1946/09/24, 72 y.o.   MRN: [id number] Visit Date: 10/12/2017  Today's Provider: Lelon Huh, MD   Chief Complaint  Patient presents with  . Annual Exam  . Depression    follow up  . Hyperlipidemia    follow up   Subjective:     Complete Physical Cameron King is a 72 y.o. male. He feels fairly well. He reports exercising daily. He reports he is sleeping well.  ----------------------------------------------------------- Follow up of Depression:  Patient was last seen for this problem 1 year ago and no changes were made. Patient reports this problem is stable but he has been sleeping excessively. He states he has had more anxiety lately and he tends to sleep more when he is anxious. States he falls asleeep swiftly when he goes to be at night and does not wake up that he is aware of until he gets up in the morning. But feels very sleepy throughout the day and quickly falls asleep when he is sedentary. States he typically sleeps 8 hours at night and about 4 hours off and on throughout the day. Has been going on for 2-3 months.   He also complains of intermittent right and mid back pain radiating to right buttocks and back of right leg. Sometimes makes it hard to walk. No clear triggers, Has not taking anything to relieve pain.     Lipid/Cholesterol, Follow-up:   Last seen for this1 years ago.  Management changes since that visit include none. . Last Lipid Panel:    Component Value Date/Time   CHOL 181 10/02/2016 1014   TRIG 158 (H) 10/02/2016 1014   HDL 37 (L) 10/02/2016 1014   CHOLHDL 4.9 10/02/2016 1014   LDLCALC 112 (H) 10/02/2016 1014    Risk factors for vascular disease include hypercholesterolemia  He reports good compliance with treatment. He is not having side effects.  Current symptoms include none and have been stable. Weight trend: decreasing steadily Prior visit with dietician: no Current diet: well  balanced Current exercise: walking  Wt Readings from Last 3 Encounters:  09/21/17 211 lb 6.4 oz (95.9 kg)  10/02/16 185 lb (83.9 kg)  09/21/16 186 lb (84.4 kg)    ------------------------------------------------------------------- Follow up of Panic Disorder:  Patient was last seen for this problem 1 year ago and no changes were made. Patient reports good compliance with treatment, good tolerance and fair symptom control.     Review of Systems  Constitutional: Positive for activity change. Negative for appetite change, chills, fatigue and fever.  HENT: Negative for congestion, ear pain, hearing loss, nosebleeds and trouble swallowing.   Eyes: Negative for pain and visual disturbance.  Respiratory: Negative for cough, chest tightness and shortness of breath.   Cardiovascular: Negative for chest pain, palpitations and leg swelling.  Gastrointestinal: Negative for abdominal pain, blood in stool, constipation, diarrhea, nausea and vomiting.  Endocrine: Negative for polydipsia, polyphagia and polyuria.  Genitourinary: Negative for dysuria and flank pain.  Musculoskeletal: Positive for back pain, gait problem, myalgias and neck pain. Negative for arthralgias, joint swelling and neck stiffness.  Skin: Negative for color change, rash and wound.  Neurological: Negative for dizziness, tremors, seizures, speech difficulty, weakness, light-headedness and headaches.  Psychiatric/Behavioral: Positive for decreased concentration. Negative for behavioral problems, confusion, dysphoric mood and sleep disturbance. The patient is nervous/anxious.   All other systems reviewed and are negative.   Social History   Socioeconomic History  .  Marital status: Widowed    Spouse name: Not on file  . Number of children: 2  . Years of education: Not on file  . Highest education level: Associate degree: occupational, Hotel manager, or vocational program  Social Needs  . Financial resource strain: Not hard at all   . Food insecurity - worry: Never true  . Food insecurity - inability: Never true  . Transportation needs - medical: No  . Transportation needs - non-medical: No  Occupational History  . Occupation: Retired    Comment: Retired 2009  Tobacco Use  . Smoking status: Former Smoker    Types: Cigarettes  . Smokeless tobacco: Never Used  . Tobacco comment: quit over 30 years ago  Substance and Sexual Activity  . Alcohol use: Yes    Alcohol/week: 0.0 oz    Comment: occasionally- wine  . Drug use: No  . Sexual activity: Not on file  Other Topics Concern  . Not on file  Social History Narrative   Pt currently has a girlfriend who has had a heart attack recently. This has added to his stress level.     Past Medical History:  Diagnosis Date  . Depression   . Hyperlipidemia   . Migraine   . Panic disorder   . Shingles 07/16/2015   of eye      Patient Active Problem List   Diagnosis Date Noted  . Chronic pain of right knee 10/02/2016  . Enthesopathy of hip 08/02/2009  . Rotator cuff syndrome 10/04/2008  . Chronic infection of sinus 03/30/2008  . HLD (hyperlipidemia) 04/09/2007  . Panic disorder 01/04/2006  . History of tobacco use 11/23/2005  . Allergic rhinitis 08/23/2005  . Depressive disorder 08/23/2005  . Impotence of organic origin 08/23/2005  . Migraine without status migrainosus 08/23/2005    Past Surgical History:  Procedure Laterality Date  . SIGMOIDOSCOPY  2008   Dr. Jamal Collin. Normal per patient report    His family history is not on file. He was adopted.      Current Outpatient Medications:  .  hydrocortisone 2.5 % lotion, Apply topically as needed. , Disp: , Rfl:  .  ibuprofen (ADVIL,MOTRIN) 200 MG tablet, Take 200 mg by mouth every 6 (six) hours as needed., Disp: , Rfl:  .  ketoconazole (NIZORAL) 2 % shampoo, Apply 1 application topically 2 (two) times a week., Disp: , Rfl:  .  loratadine (CLARITIN) 10 MG tablet, Take 1 tablet by mouth daily., Disp: , Rfl:   .  Multiple Vitamins-Minerals (EQ COMPLETE MULTIVITAMIN-ADULT PO), Take by mouth., Disp: , Rfl:  .  PARoxetine (PAXIL) 20 MG tablet, TAKE ONE TABLET BY MOUTH ONCE DAILY, Disp: 90 tablet, Rfl: 4 .  pravastatin (PRAVACHOL) 40 MG tablet, TAKE ONE TABLET BY MOUTH ONCE DAILY, Disp: 30 tablet, Rfl: 9 .  sildenafil (VIAGRA) 100 MG tablet, Take 1 tablet by mouth daily as needed., Disp: , Rfl:   Patient Care Team: Birdie Sons, MD as PCP - General (Family Medicine)     Objective:   Vitals: BP 120/80 (BP Location: Right Arm, Patient Position: Sitting, Cuff Size: Large)   Pulse 87   Temp 97.7 F (36.5 C) (Oral)   Resp 18   Ht 5\' 6"  (1.676 m)   Wt 198 lb (89.8 kg)   SpO2 100% Comment: room air  BMI 31.96 kg/m   Physical Exam   General Appearance:    Alert, cooperative, no distress, appears stated age  Head:    Normocephalic, without obvious  abnormality, atraumatic  Eyes:    PERRL, conjunctiva/corneas clear, EOM's intact, fundi    benign, both eyes       Ears:    Normal TM's and external ear canals, both ears  Nose:   Nares normal, septum midline, mucosa normal, no drainage   or sinus tenderness  Throat:   Lips, mucosa, and tongue normal; teeth and gums normal  Neck:   Supple, symmetrical, trachea midline, no adenopathy;       thyroid:  No enlargement/tenderness/nodules; no carotid   bruit or JVD  Back:     Symmetric, no curvature, ROM normal, no CVA tenderness  Lungs:     Clear to auscultation bilaterally, respirations unlabored  Chest wall:    No tenderness or deformity  Heart:    Regular rate and rhythm, S1 and S2 normal, no murmur, rub   or gallop  Abdomen:     Soft, non-tender, bowel sounds active all four quadrants,    no masses, no organomegaly  Genitalia:    deferred  Rectal:    deferred  Extremities:   Extremities normal, atraumatic, no cyanosis or edema. Negative straight leg test.   Pulses:   2+ and symmetric all extremities  Skin:   Skin color, texture, turgor  normal, no rashes or lesions  Lymph nodes:   Cervical, supraclavicular, and axillary nodes normal  Neurologic:   CNII-XII intact. Normal strength, sensation and reflexes      throughout    Activities of Daily Living In your present state of health, do you have any difficulty performing the following activities: 09/21/2017  Hearing? Y  Comment Does not see an audiologist for this but would be interested in a referral to see one.  Vision? N  Difficulty concentrating or making decisions? Y  Walking or climbing stairs? Y  Comment due to right knee pain  Dressing or bathing? N  Doing errands, shopping? N  Preparing Food and eating ? N  Using the Toilet? N  In the past six months, have you accidently leaked urine? N  Do you have problems with loss of bowel control? N  Managing your Medications? N  Managing your Finances? N  Housekeeping or managing your Housekeeping? N  Some recent data might be hidden    Fall Risk Assessment Fall Risk  09/21/2017 09/21/2016 07/19/2015  Falls in the past year? Yes Yes Yes  Number falls in past yr: 2 or more 1 2 or more  Comment - - x4  Injury with Fall? (No Data) No No  Comment muscle strain didnt f/u with doctor, still in pain (left foot) -  Follow up Falls prevention discussed Falls prevention discussed -     Depression Screen PHQ 2/9 Scores 09/21/2017 09/21/2016 07/19/2015  PHQ - 2 Score 5 1 2   PHQ- 9 Score 18 - -    Cognitive Testing - 6-CIT- (PATIENT DECLINED)    Epworth=10  Assessment & Plan:    Annual Physical Reviewed patient's Family Medical History Reviewed and updated list of patient's medical providers Assessment of cognitive impairment was done Assessed patient's functional ability Established a written schedule for health screening [place] Completed and Reviewed  Exercise Activities and Dietary recommendations Goals    . DIET - REDUCE PORTION SIZE     Recommend cutting portion sizes in half  and eating 3 small meals a day with 2 healthy snacks in between.        Immunization History  Administered Date(s) Administered  . Influenza,  High Dose Seasonal PF 07/19/2015, 08/03/2016, 06/28/2017  . Pneumococcal Conjugate-13 08/24/2014  . Pneumococcal Polysaccharide-23 05/01/2012  . Td 09/26/2003  . Tdap 07/19/2015    Health Maintenance  Topic Date Due  . COLONOSCOPY  08/11/2018  . TETANUS/TDAP  07/18/2025  . INFLUENZA VACCINE  Completed  . Hepatitis C Screening  Completed  . PNA vac Low Risk Adult  Completed     Discussed health benefits of physical activity, and encouraged him to engage in regular exercise appropriate for his age and condition.    ------------------------------------------------------------------------------------------------------------  1. Annual physical exam Up to date on routine screening and pneumococcal vaccines. Recommended he check with pharmacy regarding coverage for Shingrix.   2. Pure hypercholesterolemia He is tolerating pravastatin well with no adverse effects.   - Lipid panel - Comprehensive metabolic panel  3. Panic disorder Panic attacks are well  Controlled, but having more anxiety. Is to increase paroxetine to 2x20mg  a day. He has plenty left to get by for a month at this dose.   4. Prostate cancer screening  - PSA  5. Hypersomnia Recommended sleep study, but he states he would like to see if increasing paroxetine (as above) will help If not improved within 30 days will order MSPG.   6. BMI 31.0-31.9,adult   7. Sciatica of right side  - naproxen (NAPROSYN) 500 MG tablet; Take 1 tablet (500 mg total) by mouth 2 (two) times daily with a meal. As needed for back and leg pain  Dispense: 30 tablet; Refill: 2  8. Other fatigue  - TSH - CBC   Lelon Huh, MD  [facility]

## 2017-10-12 NOTE — Patient Instructions (Addendum)
   The CDC recommends two doses of Shingrix (the shingles vaccine) separated by 2 to 6 months for adults age 71 years and older. I recommend checking with your insurance plan regarding coverage for this vaccine.    Please contact your eyecare professional to schedule a routine eye exam   Recommend taking 81mg  enteric coated aspirin to reduce risk of vascular events such as heart attacks and strokes.

## 2017-10-13 LAB — PSA: PROSTATE SPECIFIC AG, SERUM: 1.7 ng/mL (ref 0.0–4.0)

## 2017-10-13 LAB — COMPREHENSIVE METABOLIC PANEL
ALBUMIN: 4.9 g/dL — AB (ref 3.5–4.8)
ALT: 21 IU/L (ref 0–44)
AST: 21 IU/L (ref 0–40)
Albumin/Globulin Ratio: 2.5 — ABNORMAL HIGH (ref 1.2–2.2)
Alkaline Phosphatase: 151 IU/L — ABNORMAL HIGH (ref 39–117)
BUN / CREAT RATIO: 17 (ref 10–24)
BUN: 20 mg/dL (ref 8–27)
Bilirubin Total: 1.2 mg/dL (ref 0.0–1.2)
CALCIUM: 9.8 mg/dL (ref 8.6–10.2)
CO2: 19 mmol/L — ABNORMAL LOW (ref 20–29)
Chloride: 99 mmol/L (ref 96–106)
Creatinine, Ser: 1.16 mg/dL (ref 0.76–1.27)
GFR, EST AFRICAN AMERICAN: 73 mL/min/{1.73_m2} (ref >59)
GFR, EST NON AFRICAN AMERICAN: 63 mL/min/{1.73_m2} (ref >59)
GLUCOSE: 74 mg/dL (ref 65–99)
Globulin, Total: 2 g/dL (ref 1.5–4.5)
Potassium: 4.2 mmol/L (ref 3.5–5.2)
Sodium: 141 mmol/L (ref 134–144)
TOTAL PROTEIN: 6.9 g/dL (ref 6.0–8.5)

## 2017-10-13 LAB — LIPID PANEL
CHOL/HDL RATIO: 3.5 ratio (ref 0.0–5.0)
Cholesterol, Total: 138 mg/dL (ref 100–199)
HDL: 39 mg/dL — AB (ref >39)
LDL Calculated: 81 mg/dL (ref 0–99)
TRIGLYCERIDES: 89 mg/dL (ref 0–149)
VLDL Cholesterol Cal: 18 mg/dL (ref 5–40)

## 2017-10-13 LAB — CBC
HEMATOCRIT: 48.3 % (ref 37.5–51.0)
HEMOGLOBIN: 16.3 g/dL (ref 13.0–17.7)
MCH: 29.9 pg (ref 26.6–33.0)
MCHC: 33.7 g/dL (ref 31.5–35.7)
MCV: 89 fL (ref 79–97)
Platelets: 229 10*3/uL (ref 150–379)
RBC: 5.46 x10E6/uL (ref 4.14–5.80)
RDW: 13.5 % (ref 12.3–15.4)
WBC: 6.9 10*3/uL (ref 3.4–10.8)

## 2017-10-13 LAB — TSH: TSH: 1.95 u[IU]/mL (ref 0.450–4.500)

## 2017-10-15 ENCOUNTER — Telehealth: Payer: Self-pay | Admitting: *Deleted

## 2017-10-15 NOTE — Telephone Encounter (Signed)
Patient is calling you back. Please return his call

## 2017-10-15 NOTE — Telephone Encounter (Signed)
Patient was notified of results. Expressed understanding.  

## 2017-10-15 NOTE — Telephone Encounter (Signed)
LMOVM for pt to return call 

## 2017-10-15 NOTE — Telephone Encounter (Signed)
-----   Message from Birdie Sons, MD sent at 10/14/2017  9:22 AM EST ----- Cholesterol is well controlled. Normal blood count, sugar and PSA. Continue current medications.  If sleepiness is not better within a month then call to schedule sleep study

## 2017-12-03 ENCOUNTER — Other Ambulatory Visit: Payer: Self-pay | Admitting: Family Medicine

## 2017-12-03 DIAGNOSIS — F410 Panic disorder [episodic paroxysmal anxiety] without agoraphobia: Secondary | ICD-10-CM

## 2017-12-03 MED ORDER — PRAVASTATIN SODIUM 40 MG PO TABS: 40.0000 mg | ORAL_TABLET | Freq: Every day | ORAL | 4 refills | Status: DC

## 2017-12-03 MED ORDER — PAROXETINE HCL 40 MG PO TABS: 40.0000 mg | ORAL_TABLET | Freq: Every day | ORAL | 3 refills | Status: DC

## 2017-12-03 NOTE — Telephone Encounter (Signed)
Patient needs refill on Paroxetine 40 mg. Called to [facility] on [address].

## 2017-12-07 ENCOUNTER — Other Ambulatory Visit: Payer: Self-pay

## 2017-12-07 DIAGNOSIS — F410 Panic disorder [episodic paroxysmal anxiety] without agoraphobia: Secondary | ICD-10-CM

## 2017-12-07 MED ORDER — PAROXETINE HCL 40 MG PO TABS: 40.0000 mg | ORAL_TABLET | Freq: Every day | ORAL | 3 refills | Status: DC

## 2018-06-12 ENCOUNTER — Encounter: Payer: Self-pay | Admitting: Family Medicine

## 2018-06-12 ENCOUNTER — Ambulatory Visit (INDEPENDENT_AMBULATORY_CARE_PROVIDER_SITE_OTHER): Payer: PPO | Admitting: Family Medicine

## 2018-06-12 VITALS — BP 120/80 | HR 83 | Temp 98.1°F | Resp 16 | Wt 198.0 lb

## 2018-06-12 DIAGNOSIS — H9191 Unspecified hearing loss, right ear: Secondary | ICD-10-CM | POA: Diagnosis not present

## 2018-06-12 DIAGNOSIS — R42 Dizziness and giddiness: Secondary | ICD-10-CM

## 2018-06-12 DIAGNOSIS — Z23 Encounter for immunization: Secondary | ICD-10-CM

## 2018-06-12 DIAGNOSIS — H9311 Tinnitus, right ear: Secondary | ICD-10-CM | POA: Diagnosis not present

## 2018-06-12 MED ORDER — MECLIZINE HCL 25 MG PO TABS: 25.0000 mg | ORAL_TABLET | Freq: Three times a day (TID) | ORAL | 0 refills | Status: DC | PRN

## 2018-06-12 NOTE — Patient Instructions (Signed)
We will call you about the ENT referral. 

## 2018-06-12 NOTE — Progress Notes (Signed)
  Subjective:     Patient ID: Cameron King, male   DOB: Oct 21, 1945, 72 y.o.   MRN: [id number] Chief Complaint  Patient presents with  . Dizziness    Patient here today C/O 3 episodes of dizziness in 3 weeks. Patient reports nausea, vomiting and sweating. Patient reports first two episodes last about an hour. Patient reports yesterday it lasted about 3 hours. Patient reports dizziness happens when he is lying down. Patient reports that 4 weeks ago he did have a "head cold" pt is also C/O of hearing loss in right ear.   HPI States he had a an upper respiratory infection prior to the onset of his current sx. States he has mild residual sinus drainage. States he then developed intermittent decreased hearing and ringing in his right ear.Subsequently has had 3 episodes of vertigo when lying down with nystagmus, vomiting and sweating lasting from 2-5 hours. Longest episode was yesterday.  Review of Systems     Objective:   Physical Exam  Constitutional: He appears well-developed and well-nourished. No distress.  HENT:  Ear canals are patent, TM's intact, dull, without light reflex.  Eyes: Pupils are equal, round, and reactive to light. EOM are normal.  Neck: Carotid bruit is not present.       Assessment:    1. Vertigo - Ambulatory referral to ENT  2. Hearing loss of right ear, unspecified hearing loss type - Ambulatory referral to ENT  3. Tinnitus of right ear - Ambulatory referral to ENT  4. Need for influenza vaccination - Flu vaccine HIGH DOSE PF    Plan:    Further f/u pending ENT referral.

## 2018-06-19 DIAGNOSIS — R42 Dizziness and giddiness: Secondary | ICD-10-CM | POA: Diagnosis not present

## 2018-06-19 DIAGNOSIS — H903 Sensorineural hearing loss, bilateral: Secondary | ICD-10-CM | POA: Diagnosis not present

## 2018-06-24 ENCOUNTER — Other Ambulatory Visit: Payer: Self-pay | Admitting: Physician Assistant

## 2018-06-24 DIAGNOSIS — H9041 Sensorineural hearing loss, unilateral, right ear, with unrestricted hearing on the contralateral side: Secondary | ICD-10-CM

## 2018-06-24 DIAGNOSIS — IMO0001 Reserved for inherently not codable concepts without codable children: Secondary | ICD-10-CM

## 2018-07-03 ENCOUNTER — Ambulatory Visit
Admission: RE | Admit: 2018-07-03 | Discharge: 2018-07-03 | Disposition: A | Discharge status: Home / Self Care | Payer: PPO | Source: Ambulatory Visit | Attending: Physician Assistant | Admitting: Physician Assistant

## 2018-07-03 DIAGNOSIS — G9389 Other specified disorders of brain: Secondary | ICD-10-CM | POA: Insufficient documentation

## 2018-07-03 DIAGNOSIS — H9041 Sensorineural hearing loss, unilateral, right ear, with unrestricted hearing on the contralateral side: Secondary | ICD-10-CM | POA: Diagnosis not present

## 2018-07-03 DIAGNOSIS — IMO0001 Reserved for inherently not codable concepts without codable children: Secondary | ICD-10-CM

## 2018-07-03 MED ORDER — GADOBUTROL 1 MMOL/ML IV SOLN
9.0000 mL | Freq: Once | INTRAVENOUS | Status: AC | PRN
  Administered 2018-07-03: 9 mL via INTRAVENOUS

## 2018-07-05 DIAGNOSIS — I10 Essential (primary) hypertension: Secondary | ICD-10-CM | POA: Diagnosis not present

## 2018-07-05 DIAGNOSIS — R42 Dizziness and giddiness: Secondary | ICD-10-CM | POA: Diagnosis not present

## 2018-07-05 DIAGNOSIS — H903 Sensorineural hearing loss, bilateral: Secondary | ICD-10-CM | POA: Diagnosis not present

## 2018-07-05 DIAGNOSIS — G912 (Idiopathic) normal pressure hydrocephalus: Secondary | ICD-10-CM | POA: Diagnosis not present

## 2018-07-05 DIAGNOSIS — H90A21 Sensorineural hearing loss, unilateral, right ear, with restricted hearing on the contralateral side: Secondary | ICD-10-CM | POA: Diagnosis not present

## 2018-07-09 ENCOUNTER — Ambulatory Visit (INDEPENDENT_AMBULATORY_CARE_PROVIDER_SITE_OTHER): Payer: PPO | Admitting: Family Medicine

## 2018-07-09 ENCOUNTER — Encounter: Payer: Self-pay | Admitting: Family Medicine

## 2018-07-09 VITALS — BP 128/76 | HR 81 | Temp 98.0°F | Resp 16 | Wt 197.0 lb

## 2018-07-09 DIAGNOSIS — G9389 Other specified disorders of brain: Secondary | ICD-10-CM | POA: Diagnosis not present

## 2018-07-09 DIAGNOSIS — R030 Elevated blood-pressure reading, without diagnosis of hypertension: Secondary | ICD-10-CM

## 2018-07-09 LAB — POCT I-STAT CREATININE: Creatinine, Ser: 1 mg/dL (ref 0.61–1.24)

## 2018-07-09 NOTE — Progress Notes (Signed)
Patient: Cameron King Male    DOB: 12-04-1945   72 y.o.   MRN: [id number] Visit Date: 07/09/2018  Today's Provider: Lelon Huh, MD   Chief Complaint  Patient presents with  . Blood Pressure Check   Subjective:    HPI Blood Pressure check: Patient comes in today stating his home blood pressure readings have been elevated. He reports has been up an down over the last week, his blood pressure was as high as 160/100. Patient has also had some headaches nausea and vomiting over the weekend. He denies chest pain, shortness of breath, numbness, or blurred vision. He had been on prednisone prescribed by ENT, but stopped about 3 weeks ago.   He had MRI head on 07-04-2018 ordered by ENT for asymmetric hearing loss.   IMPRESSION: 1. No evidence of retrocochlear lesion. 2. Ventriculomegaly, please correlate for symptoms of normal pressure hydrocephalus.  He denies any change in memory, confusion, gait or balance.     No Known Allergies   Current Outpatient Medications:  .  hydrocortisone 2.5 % lotion, Apply topically as needed. , Disp: , Rfl:  .  ibuprofen (ADVIL,MOTRIN) 200 MG tablet, Take 200 mg by mouth every 6 (six) hours as needed., Disp: , Rfl:  .  ketoconazole (NIZORAL) 2 % shampoo, Apply 1 application topically 2 (two) times a week., Disp: , Rfl:  .  loratadine (CLARITIN) 10 MG tablet, Take 1 tablet by mouth daily., Disp: , Rfl:  .  meclizine (ANTIVERT) 25 MG tablet, Take 1 tablet (25 mg total) by mouth 3 (three) times daily as needed for dizziness., Disp: 30 tablet, Rfl: 0 .  Multiple Vitamins-Minerals (EQ COMPLETE MULTIVITAMIN-ADULT PO), Take by mouth., Disp: , Rfl:  .  naproxen (NAPROSYN) 500 MG tablet, Take 1 tablet (500 mg total) by mouth 2 (two) times daily with a meal. As needed for back and leg pain, Disp: 30 tablet, Rfl: 2 .  PARoxetine (PAXIL) 40 MG tablet, Take 1 tablet (40 mg total) by mouth daily., Disp: 90 tablet, Rfl: 3 .  pravastatin (PRAVACHOL) 40 MG  tablet, Take 1 tablet (40 mg total) by mouth daily., Disp: 90 tablet, Rfl: 4 .  sildenafil (VIAGRA) 100 MG tablet, Take 1 tablet by mouth daily as needed., Disp: , Rfl:   Review of Systems  Constitutional: Negative for appetite change, chills and fever.  HENT: Positive for facial swelling.   Respiratory: Negative for chest tightness, shortness of breath and wheezing.   Cardiovascular: Negative for chest pain and palpitations.  Gastrointestinal: Negative for abdominal pain, nausea and vomiting.  Neurological: Positive for headaches. Negative for dizziness.    Social History   Tobacco Use  . Smoking status: Former Smoker    Types: Cigarettes  . Smokeless tobacco: Never Used  . Tobacco comment: quit over 30 years ago  Substance Use Topics  . Alcohol use: Yes    Alcohol/week: 0.0 standard drinks    Comment: occasionally- wine   Objective:   BP 128/76 (BP Location: Right Arm, Cuff Size: Large)   Pulse 81   Temp 98 F (36.7 C) (Oral)   Resp 16   Wt 197 lb (89.4 kg)   SpO2 99% Comment: room air  BMI 31.80 kg/m  Vitals:   07/09/18 0843 07/09/18 0853  BP: 120/72 128/76  Pulse: 81   Resp: 16   Temp: 98 F (36.7 C)   TempSrc: Oral   SpO2: 99%   Weight: 197 lb (89.4 kg)  Physical Exam  General appearance: alert, well developed, well nourished, cooperative and in no distress Head: Normocephalic, without obvious abnormality, atraumatic Respiratory: Respirations even and unlabored, normal respiratory rate Extremities: No gross deformities Skin: Skin color, texture, turgor normal. No rashes seen  Psych: Appropriate mood and affect. Neurologic: Mental status: Alert, oriented to person, place, and time, thought content appropriate.     Assessment & Plan:     1. Transient elevated blood pressure Is back to normal now, may have been secondary to prednisone. He is to call if any new sx or if BP goes back up.   2. Cerebral ventriculomegaly No clear s/s of hydrocephalus.  Will have neurology evaluate and follow.  - Ambulatory referral to Neurology       Lelon Huh, MD  [facility]

## 2018-07-19 DIAGNOSIS — R42 Dizziness and giddiness: Secondary | ICD-10-CM | POA: Diagnosis not present

## 2018-07-25 DIAGNOSIS — H9121 Sudden idiopathic hearing loss, right ear: Secondary | ICD-10-CM | POA: Diagnosis not present

## 2018-07-26 DIAGNOSIS — R42 Dizziness and giddiness: Secondary | ICD-10-CM | POA: Diagnosis not present

## 2018-07-26 DIAGNOSIS — G912 (Idiopathic) normal pressure hydrocephalus: Secondary | ICD-10-CM | POA: Diagnosis not present

## 2018-08-01 DIAGNOSIS — H9121 Sudden idiopathic hearing loss, right ear: Secondary | ICD-10-CM | POA: Diagnosis not present

## 2018-08-08 DIAGNOSIS — H9121 Sudden idiopathic hearing loss, right ear: Secondary | ICD-10-CM | POA: Diagnosis not present

## 2018-08-08 DIAGNOSIS — H9120 Sudden idiopathic hearing loss, unspecified ear: Secondary | ICD-10-CM | POA: Diagnosis not present

## 2018-08-08 DIAGNOSIS — H9311 Tinnitus, right ear: Secondary | ICD-10-CM | POA: Diagnosis not present

## 2018-08-14 DIAGNOSIS — R42 Dizziness and giddiness: Secondary | ICD-10-CM | POA: Diagnosis not present

## 2018-08-14 DIAGNOSIS — H9193 Unspecified hearing loss, bilateral: Secondary | ICD-10-CM | POA: Diagnosis not present

## 2018-08-14 DIAGNOSIS — G9389 Other specified disorders of brain: Secondary | ICD-10-CM | POA: Diagnosis not present

## 2018-08-21 DIAGNOSIS — H9193 Unspecified hearing loss, bilateral: Secondary | ICD-10-CM | POA: Insufficient documentation

## 2018-08-21 DIAGNOSIS — G9389 Other specified disorders of brain: Secondary | ICD-10-CM | POA: Insufficient documentation

## 2018-09-09 DIAGNOSIS — H8101 Meniere's disease, right ear: Secondary | ICD-10-CM | POA: Diagnosis not present

## 2018-09-09 DIAGNOSIS — H903 Sensorineural hearing loss, bilateral: Secondary | ICD-10-CM | POA: Diagnosis not present

## 2018-09-26 ENCOUNTER — Ambulatory Visit (INDEPENDENT_AMBULATORY_CARE_PROVIDER_SITE_OTHER): Payer: PPO

## 2018-09-26 ENCOUNTER — Telehealth: Payer: Self-pay

## 2018-09-26 VITALS — BP 122/62 | HR 82 | Temp 98.4°F | Ht 66.0 in | Wt 192.0 lb

## 2018-09-26 DIAGNOSIS — Z0000 Encounter for general adult medical examination without abnormal findings: Secondary | ICD-10-CM

## 2018-09-26 DIAGNOSIS — Z1211 Encounter for screening for malignant neoplasm of colon: Secondary | ICD-10-CM | POA: Diagnosis not present

## 2018-09-26 NOTE — Patient Instructions (Addendum)
Cameron King , Thank you for taking time to come for your Medicare Wellness Visit. I appreciate your ongoing commitment to your health goals. Please review the following plan we discussed and let me know if I can assist you in the future.   Screening recommendations/referrals: Colonoscopy: Ordered today. Pt aware office will contact him to set up apt.  Recommended yearly ophthalmology/optometry visit for glaucoma screening and checkup Recommended yearly dental visit for hygiene and checkup  Vaccinations: Influenza vaccine: Up to date Pneumococcal vaccine: Completed series Tdap vaccine: Up to date, due 06/2025 Shingles vaccine: Pt declines today.     Advanced directives: Advance directive discussed with you today. Even though you declined this today please call our office should you change your mind and we can give you the proper paperwork for you to fill out.  Conditions/risks identified: Obesity- continue cutting back on sweets and controlling portion sizes.   Next appointment: 10/07/18 @ 9:00 AM with Dr Caryn Section.   Preventive Care 72 Years and Older, Male Preventive care refers to lifestyle choices and visits with your health care provider that can promote health and wellness. What does preventive care include?  A yearly physical exam. This is also called an annual well check.  Dental exams once or twice a year.  Routine eye exams. Ask your health care provider how often you should have your eyes checked.  Personal lifestyle choices, including:  Daily care of your teeth and gums.  Regular physical activity.  Eating a healthy diet.  Avoiding tobacco and drug use.  Limiting alcohol use.  Practicing safe sex.  Taking low doses of aspirin every day.  Taking vitamin and mineral supplements as recommended by your health care provider. What happens during an annual well check? The services and screenings done by your health care provider during your annual well check will  depend on your age, overall health, lifestyle risk factors, and family history of disease. Counseling  Your health care provider may ask you questions about your:  Alcohol use.  Tobacco use.  Drug use.  Emotional well-being.  Home and relationship well-being.  Sexual activity.  Eating habits.  History of falls.  Memory and ability to understand (cognition).  Work and work Statistician. Screening  You may have the following tests or measurements:  Height, weight, and BMI.  Blood pressure.  Lipid and cholesterol levels. These may be checked every 5 years, or more frequently if you are over 31 years old.  Skin check.  Lung cancer screening. You may have this screening every year starting at age 52 if you have a 30-pack-year history of smoking and currently smoke or have quit within the past 15 years.  Fecal occult blood test (FOBT) of the stool. You may have this test every year starting at age 72.  Flexible sigmoidoscopy or colonoscopy. You may have a sigmoidoscopy every 5 years or a colonoscopy every 10 years starting at age 72.  Prostate cancer screening. Recommendations will vary depending on your family history and other risks.  Hepatitis C blood test.  Hepatitis B blood test.  Sexually transmitted disease (STD) testing.  Diabetes screening. This is done by checking your blood sugar (glucose) after you have not eaten for a while (fasting). You may have this done every 1-3 years.  Abdominal aortic aneurysm (AAA) screening. You may need this if you are a current or former smoker.  Osteoporosis. You may be screened starting at age 48 if you are at high risk. Talk with your health  care provider about your test results, treatment options, and if necessary, the need for more tests. Vaccines  Your health care provider may recommend certain vaccines, such as:  Influenza vaccine. This is recommended every year.  Tetanus, diphtheria, and acellular pertussis (Tdap,  Td) vaccine. You may need a Td booster every 10 years.  Zoster vaccine. You may need this after age 65.  Pneumococcal 13-valent conjugate (PCV13) vaccine. One dose is recommended after age 63.  Pneumococcal polysaccharide (PPSV23) vaccine. One dose is recommended after age 46. Talk to your health care provider about which screenings and vaccines you need and how often you need them. This information is not intended to replace advice given to you by your health care provider. Make sure you discuss any questions you have with your health care provider. Document Released: 10/08/2015 Document Revised: 05/31/2016 Document Reviewed: 07/13/2015 Elsevier Interactive Patient Education  2017 [place] Prevention in the Home Falls can cause injuries. They can happen to people of all ages. There are many things you can do to make your home safe and to help prevent falls. What can I do on the outside of my home?  Regularly fix the edges of walkways and driveways and fix any cracks.  Remove anything that might make you trip as you walk through a door, such as a raised step or threshold.  Trim any bushes or trees on the path to your home.  Use bright outdoor lighting.  Clear any walking paths of anything that might make someone trip, such as rocks or tools.  Regularly check to see if handrails are loose or broken. Make sure that both sides of any steps have handrails.  Any raised decks and porches should have guardrails on the edges.  Have any leaves, snow, or ice cleared regularly.  Use sand or salt on walking paths during winter.  Clean up any spills in your garage right away. This includes oil or grease spills. What can I do in the bathroom?  Use night lights.  Install grab bars by the toilet and in the tub and shower. Do not use towel bars as grab bars.  Use non-skid mats or decals in the tub or shower.  If you need to sit down in the shower, use a plastic, non-slip  stool.  Keep the floor dry. Clean up any water that spills on the floor as soon as it happens.  Remove soap buildup in the tub or shower regularly.  Attach bath mats securely with double-sided non-slip rug tape.  Do not have throw rugs and other things on the floor that can make you trip. What can I do in the bedroom?  Use night lights.  Make sure that you have a light by your bed that is easy to reach.  Do not use any sheets or blankets that are too big for your bed. They should not hang down onto the floor.  Have a firm chair that has side arms. You can use this for support while you get dressed.  Do not have throw rugs and other things on the floor that can make you trip. What can I do in the kitchen?  Clean up any spills right away.  Avoid walking on wet floors.  Keep items that you use a lot in easy-to-reach places.  If you need to reach something above you, use a strong step stool that has a grab bar.  Keep electrical cords out of the way.  Do not use floor  polish or wax that makes floors slippery. If you must use wax, use non-skid floor wax.  Do not have throw rugs and other things on the floor that can make you trip. What can I do with my stairs?  Do not leave any items on the stairs.  Make sure that there are handrails on both sides of the stairs and use them. Fix handrails that are broken or loose. Make sure that handrails are as long as the stairways.  Check any carpeting to make sure that it is firmly attached to the stairs. Fix any carpet that is loose or worn.  Avoid having throw rugs at the top or bottom of the stairs. If you do have throw rugs, attach them to the floor with carpet tape.  Make sure that you have a light switch at the top of the stairs and the bottom of the stairs. If you do not have them, ask someone to add them for you. What else can I do to help prevent falls?  Wear shoes that:  Do not have high heels.  Have rubber bottoms.  Are  comfortable and fit you well.  Are closed at the toe. Do not wear sandals.  If you use a stepladder:  Make sure that it is fully opened. Do not climb a closed stepladder.  Make sure that both sides of the stepladder are locked into place.  Ask someone to hold it for you, if possible.  Clearly mark and make sure that you can see:  Any grab bars or handrails.  First and last steps.  Where the edge of each step is.  Use tools that help you move around (mobility aids) if they are needed. These include:  Canes.  Walkers.  Scooters.  Crutches.  Turn on the lights when you go into a dark area. Replace any light bulbs as soon as they burn out.  Set up your furniture so you have a clear path. Avoid moving your furniture around.  If any of your floors are uneven, fix them.  If there are any pets around you, be aware of where they are.  Review your medicines with your doctor. Some medicines can make you feel dizzy. This can increase your chance of falling. Ask your doctor what other things that you can do to help prevent falls. This information is not intended to replace advice given to you by your health care provider. Make sure you discuss any questions you have with your health care provider. Document Released: 07/08/2009 Document Revised: 02/17/2016 Document Reviewed: 10/16/2014 Elsevier Interactive Patient Education  2017 [employer].

## 2018-09-26 NOTE — Telephone Encounter (Signed)
Pt is returning a call to schedule colonoscopy

## 2018-09-26 NOTE — Progress Notes (Signed)
Subjective:   Cameron King is a 73 y.o. male who presents for Medicare Annual/Subsequent preventive examination.  Review of Systems:  N/A  Cardiac Risk Factors include: advanced age (>9men, >11 women);dyslipidemia;male gender;obesity (BMI >30kg/m2)     Objective:    Vitals: BP 122/62 (BP Location: Right Arm)   Pulse 82   Temp 98.4 F (36.9 C) (Oral)   Ht 5\' 6"  (1.676 m)   Wt 192 lb (87.1 kg)   BMI 30.99 kg/m   Body mass index is 30.99 kg/m.  Advanced Directives 09/26/2018 09/21/2017 09/21/2016  Does Patient Have a Medical Advance Directive? No No No  Would patient like information on creating a medical advance directive? No - Patient declined Yes (MAU/Ambulatory/Procedural Areas - Information given) No - Patient declined    Tobacco Social History   Tobacco Use  Smoking Status Former Smoker  . Types: Cigarettes  Smokeless Tobacco Never Used  Tobacco Comment   quit over 30 years ago     Counseling given: Not Answered Comment: quit over 30 years ago   Clinical Intake:  Pre-visit preparation completed: Yes  Pain : No/denies pain Pain Score: 0-No pain     Nutritional Status: BMI > 30  Obese Nutritional Risks: None Diabetes: No  How often do you need to have someone help you when you read instructions, pamphlets, or other written materials from your doctor or pharmacy?: 1 - Never  Interpreter Needed?: No  Information entered by :: [facility], LPN  Past Medical History:  Diagnosis Date  . Depression   . Hyperlipidemia   . Migraine   . Panic disorder   . Shingles 07/16/2015   of eye    Past Surgical History:  Procedure Laterality Date  . SIGMOIDOSCOPY  2008   Dr. Jamal King. Normal per patient report   Family History  Adopted: Yes   Social History   Socioeconomic History  . Marital status: Widowed    Spouse name: Not on file  . Number of children: 2  . Years of education: Not on file  . Highest education level: Associate degree:  occupational, Hotel manager, or vocational program  Occupational History  . Occupation: Retired    Comment: Retired 2009  Social Needs  . Financial resource strain: Not hard at all  . Food insecurity:    Worry: Never true    Inability: Never true  . Transportation needs:    Medical: No    Non-medical: No  Tobacco Use  . Smoking status: Former Smoker    Types: Cigarettes  . Smokeless tobacco: Never Used  . Tobacco comment: quit over 30 years ago  Substance and Sexual Activity  . Alcohol use: Yes    Alcohol/week: 0.0 standard drinks    Comment: occasionally- wine  . Drug use: No  . Sexual activity: Not on file  Lifestyle  . Physical activity:    Days per week: 0 days    Minutes per session: 0 min  . Stress: Not at all  Relationships  . Social connections:    Talks on phone: Patient refused    Gets together: Patient refused    Attends religious service: Patient refused    Active member of club or organization: Patient refused    Attends meetings of clubs or organizations: Patient refused    Relationship status: Patient refused  Other Topics Concern  . Not on file  Social History Narrative   Pt currently has a girlfriend who has had a heart attack recently. This has added  to his stress level.     Outpatient Encounter Medications as of 09/26/2018  Medication Sig  . Dextromethorphan-guaiFENesin (MUCINEX DM) 30-600 MG TB12 Take 1 tablet by mouth every 12 (twelve) hours as needed.  . hydrochlorothiazide (HYDRODIURIL) 25 MG tablet Take 25 mg by mouth daily.  . hydrocortisone 2.5 % lotion Apply topically as needed.   [name] Kitchen ibuprofen (ADVIL,MOTRIN) 200 MG tablet Take 200 mg by mouth every 6 (six) hours as needed.  [name] Kitchen ketoconazole (NIZORAL) 2 % shampoo Apply 1 application topically 2 (two) times a week.  . loratadine (CLARITIN) 10 MG tablet Take 1 tablet by mouth daily as needed.   . meclizine (ANTIVERT) 25 MG tablet Take 1 tablet (25 mg total) by mouth 3 (three) times daily as needed for  dizziness.  . Multiple Vitamins-Minerals (EQ COMPLETE MULTIVITAMIN-ADULT PO) Take by mouth.  . naproxen (NAPROSYN) 500 MG tablet Take 1 tablet (500 mg total) by mouth 2 (two) times daily with a meal. As needed for back and leg pain  . PARoxetine (PAXIL) 40 MG tablet Take 1 tablet (40 mg total) by mouth daily.  . pravastatin (PRAVACHOL) 40 MG tablet Take 1 tablet (40 mg total) by mouth daily.  . sildenafil (VIAGRA) 100 MG tablet Take 1 tablet by mouth daily as needed.  . SUMAtriptan (IMITREX) 100 MG tablet Take 100 mg by mouth every 2 (two) hours as needed for migraine. May repeat in 2 hours if headache persists or recurs.   No facility-administered encounter medications on file as of 09/26/2018.     Activities of Daily Living In your present state of health, do you have any difficulty performing the following activities: 09/26/2018  Hearing? Y  Comment Due to current inner ear issues.   Vision? N  Comment Wears readers as needed.   Difficulty concentrating or making decisions? Y  Walking or climbing stairs? Y  Comment Due to right knee pain and left ankle pain.   Dressing or bathing? N  Doing errands, shopping? N  Preparing Food and eating ? N  Using the Toilet? N  In the past six months, have you accidently leaked urine? Y  Comment Occasionally due to diuretic use.   Do you have problems with loss of bowel control? N  Managing your Medications? N  Managing your Finances? N  Housekeeping or managing your Housekeeping? N  Some recent data might be hidden    Patient Care Team: Cameron Sons, MD as PCP - General (Family Medicine) Cameron Manner, MD as Referring Physician (Otolaryngology)   Assessment:   This is a routine wellness examination for Cameron King.  Exercise Activities and Dietary recommendations Current Exercise Habits: The patient does not participate in regular exercise at present, Exercise limited by: orthopedic condition(s)  Goals    . DIET - REDUCE PORTION SIZE       Recommend cutting portion sizes in half and eating 3 small meals a day with 2 healthy snacks in between.     . Increase water intake     Starting 09/21/16, I will increase my water intake to 5-6 glasses a day.       Fall Risk Fall Risk  09/26/2018 09/21/2017 09/21/2016 07/19/2015  Falls in the past year? 1 Yes Yes Yes  Number falls in past yr: 1 2 or more 1 2 or more  Comment - - - x4  Injury with Fall? 0 (No Data) No No  Comment - muscle strain didnt f/u with doctor, still in pain (left foot) -  Follow up Falls prevention discussed Falls prevention discussed Falls prevention discussed -   FALL RISK PREVENTION PERTAINING TO THE HOME:  Any stairs in or around the home WITH handrails? No  Home free of loose throw rugs in walkways, pet beds, electrical cords, etc? Yes  Adequate lighting in your home to reduce risk of falls? Yes   ASSISTIVE DEVICES UTILIZED TO PREVENT FALLS:  Life alert? No  Use of a cane, walker or w/c? No  Grab bars in the bathroom? Yes Shower chair or bench in shower? Yes Elevated toilet seat or a handicapped toilet? No    TIMED UP AND GO:  Was the test performed? No .     Depression Screen PHQ 2/9 Scores 09/26/2018 09/21/2017 09/21/2016 07/19/2015  PHQ - 2 Score 2 5 1 2   PHQ- 9 Score 6 18 - -    Cognitive Function: Declined today.         Immunization History  Administered Date(s) Administered  . Influenza, High Dose Seasonal PF 07/19/2015, 08/03/2016, 06/28/2017, 06/12/2018  . Pneumococcal Conjugate-13 08/24/2014  . Pneumococcal Polysaccharide-23 05/01/2012  . Td 09/26/2003  . Tdap 07/19/2015    Qualifies for Shingles Vaccine? Yes . Due for Shingrix. Education has been provided regarding the importance of this vaccine. Pt has been advised to call insurance company to determine out of pocket expense. Advised may also receive vaccine at local pharmacy or Health Dept. Verbalized acceptance and understanding.  Tdap: Up to date  Flu Vaccine:  Up to date  Pneumococcal Vaccine: Up to date   Screening Tests Health Maintenance  Topic Date Due  . COLONOSCOPY  08/11/2018  . TETANUS/TDAP  07/18/2025  . INFLUENZA VACCINE  Completed  . Hepatitis C Screening  Completed  . PNA vac Low Risk Adult  Completed   Cancer Screenings:  Colorectal Screening: Completed 08/11/13. Repeat every 5 years; Referral to GI placed today. Pt aware the office will call re: appt.  Lung Cancer Screening: (Low Dose CT Chest recommended if Age 76-80 years, 30 pack-year currently smoking OR have quit w/in 15years.) does not qualify.    Additional Screening:  Hepatitis C Screening: Up to date  Vision Screening: Recommended annual ophthalmology exams for early detection of glaucoma and other disorders of the eye.  Dental Screening: Recommended annual dental exams for proper oral hygiene  Community Resource Referral:  CRR required this visit?  No        Plan:  I have personally reviewed and addressed the Medicare Annual Wellness questionnaire and have noted the following in the patient's chart:  A. Medical and social history B. Use of alcohol, tobacco or illicit drugs  C. Current medications and supplements D. Functional ability and status E.  Nutritional status F.  Physical activity G. Advance directives H. List of other physicians I.  Hospitalizations, surgeries, and ER visits in previous 12 months J.  [place] such as hearing and vision if needed, cognitive and depression L. Referrals and appointments - none  In addition, I have reviewed and discussed with patient certain preventive protocols, quality metrics, and best practice recommendations. A written personalized care plan for preventive services as well as general preventive health recommendations were provided to patient.  See attached scanned questionnaire for additional information.   Signed,  Fabio Neighbors, LPN Nurse Health Advisor   Nurse Recommendations:  None.

## 2018-10-01 ENCOUNTER — Other Ambulatory Visit: Payer: Self-pay

## 2018-10-01 ENCOUNTER — Telehealth: Payer: Self-pay | Admitting: Gastroenterology

## 2018-10-01 DIAGNOSIS — Z86010 Personal history of colonic polyps: Secondary | ICD-10-CM

## 2018-10-01 NOTE — Telephone Encounter (Signed)
Patients call has been returned.  He has been scheduled for a colonoscopy with Dr. Allen Norris at [facility] on 10/29/18.  Thanks [employer]

## 2018-10-01 NOTE — Telephone Encounter (Signed)
Patient returned call from last week to schedule a colonoscopy in the work-q.

## 2018-10-07 ENCOUNTER — Ambulatory Visit (INDEPENDENT_AMBULATORY_CARE_PROVIDER_SITE_OTHER): Payer: PPO | Admitting: Family Medicine

## 2018-10-07 ENCOUNTER — Encounter: Payer: Self-pay | Admitting: Family Medicine

## 2018-10-07 ENCOUNTER — Telehealth: Payer: Self-pay | Admitting: Gastroenterology

## 2018-10-07 ENCOUNTER — Other Ambulatory Visit: Payer: Self-pay

## 2018-10-07 VITALS — BP 124/85 | HR 88 | Temp 98.0°F | Resp 16 | Ht 66.0 in | Wt 189.0 lb

## 2018-10-07 DIAGNOSIS — M25572 Pain in left ankle and joints of left foot: Secondary | ICD-10-CM

## 2018-10-07 DIAGNOSIS — F329 Major depressive disorder, single episode, unspecified: Secondary | ICD-10-CM

## 2018-10-07 DIAGNOSIS — Z125 Encounter for screening for malignant neoplasm of prostate: Secondary | ICD-10-CM | POA: Diagnosis not present

## 2018-10-07 DIAGNOSIS — Z6830 Body mass index (BMI) 30.0-30.9, adult: Secondary | ICD-10-CM

## 2018-10-07 DIAGNOSIS — G8929 Other chronic pain: Secondary | ICD-10-CM | POA: Diagnosis not present

## 2018-10-07 DIAGNOSIS — M25561 Pain in right knee: Secondary | ICD-10-CM

## 2018-10-07 DIAGNOSIS — F32A Depression, unspecified: Secondary | ICD-10-CM

## 2018-10-07 DIAGNOSIS — F410 Panic disorder [episodic paroxysmal anxiety] without agoraphobia: Secondary | ICD-10-CM

## 2018-10-07 DIAGNOSIS — Z0000 Encounter for general adult medical examination without abnormal findings: Secondary | ICD-10-CM

## 2018-10-07 DIAGNOSIS — E7800 Pure hypercholesterolemia, unspecified: Secondary | ICD-10-CM

## 2018-10-07 MED ORDER — PEG 3350-KCL-NA BICARB-NACL 420 G PO SOLR: ORAL | 0 refills | Status: DC

## 2018-10-07 NOTE — Telephone Encounter (Signed)
Patient came in & stated his Su-prep was around $100 dollars & needs a different prescription called in to [facility] on [address].

## 2018-10-07 NOTE — Progress Notes (Signed)
Patient: Cameron King, Male    DOB: October 23, 1945, 73 y.o.   MRN: [id number] Visit Date: 10/07/2018  Today's Provider: Lelon Huh, MD   Chief Complaint  Patient presents with  . Annual Exam  . Hyperlipidemia   Subjective:     Complete Physical Cameron King is a 73 y.o. male. He feels fairly well. He reports no regular exercising . He reports he is sleeping fairly well.  He is scheduled for colonoscopy follow up with Dr. Allen Norris in February.   His main complaint today Is persistent pain in right knee and left ankle ever since having a fell a few years ago. Has pain every day, but is relieved with prescription naproxen. He is inquiring if he can take naproxen every day.  -----------------------------------------------------------  Lipid/Cholesterol, Follow-up:   Last seen for this 1 years ago.  Management changes since that visit include none. . Last Lipid Panel:    Component Value Date/Time   CHOL 138 10/12/2017 1232   TRIG 89 10/12/2017 1232   HDL 39 (L) 10/12/2017 1232   CHOLHDL 3.5 10/12/2017 1232   [place] 81 10/12/2017 1232    Risk factors for vascular disease include hypercholesterolemia  He reports good compliance with treatment. He is not having side effects.  Current symptoms include none and have been stable. Weight trend: decreasing steadily Prior visit with dietician: no Current diet: in general, a "healthy" diet   Current exercise: none  Wt Readings from Last 3 Encounters:  10/07/18 189 lb (85.7 kg)  09/26/18 192 lb (87.1 kg)  07/09/18 197 lb (89.4 kg)    -------------------------------------------------------------------  Follow up for Panic Disorder:  The patient was last seen for this 1 years ago. Changes made at last visit include none.  He reports good compliance with treatment. He feels that condition is stable. He is not having side effects.    ------------------------------------------------------------------------------------    Review of Systems  Constitutional: Negative for appetite change, chills, fatigue and fever.  HENT: Negative for congestion, ear pain, hearing loss, nosebleeds and trouble swallowing.   Eyes: Negative for pain and visual disturbance.  Respiratory: Negative for cough, chest tightness and shortness of breath.   Cardiovascular: Negative for chest pain, palpitations and leg swelling.  Gastrointestinal: Negative for abdominal pain, blood in stool, constipation, diarrhea, nausea and vomiting.  Endocrine: Negative for polydipsia, polyphagia and polyuria.  Genitourinary: Negative for dysuria and flank pain.  Musculoskeletal: Negative for arthralgias, back pain, joint swelling, myalgias and neck stiffness.  Skin: Negative for color change, rash and wound.  Neurological: Negative for dizziness, tremors, seizures, speech difficulty, weakness, light-headedness and headaches.  Psychiatric/Behavioral: Negative for behavioral problems, confusion, decreased concentration, dysphoric mood and sleep disturbance. The patient is not nervous/anxious.   All other systems reviewed and are negative.   Social History   Socioeconomic History  . Marital status: Widowed    Spouse name: Not on file  . Number of children: 2  . Years of education: Not on file  . Highest education level: Associate degree: occupational, Hotel manager, or vocational program  Occupational History  . Occupation: Retired    Comment: Retired 2009  Social Needs  . Financial resource strain: Not hard at all  . Food insecurity:    Worry: Never true    Inability: Never true  . Transportation needs:    Medical: No    Non-medical: No  Tobacco Use  . Smoking status: Former Smoker    Types: Cigarettes  . Smokeless  tobacco: Never Used  . Tobacco comment: quit over 30 years ago  Substance and Sexual Activity  . Alcohol use: Yes    Alcohol/week: 0.0  standard drinks    Comment: occasionally- wine  . Drug use: No  . Sexual activity: Not on file  Lifestyle  . Physical activity:    Days per week: 0 days    Minutes per session: 0 min  . Stress: Not at all  Relationships  . Social connections:    Talks on phone: Patient refused    Gets together: Patient refused    Attends religious service: Patient refused    Active member of club or organization: Patient refused    Attends meetings of clubs or organizations: Patient refused    Relationship status: Patient refused  . Intimate partner violence:    Fear of current or ex partner: No    Emotionally abused: No    Physically abused: No    Forced sexual activity: No  Other Topics Concern  . Not on file  Social History Narrative   Pt currently has a girlfriend who has had a heart attack recently. This has added to his stress level.     Past Medical History:  Diagnosis Date  . Depression   . Hyperlipidemia   . Migraine   . Panic disorder   . Shingles 07/16/2015   of eye      Patient Active Problem List   Diagnosis Date Noted  . Chronic pain of right knee 10/02/2016  . Enthesopathy of hip 08/02/2009  . Rotator cuff syndrome 10/04/2008  . Chronic infection of sinus 03/30/2008  . HLD (hyperlipidemia) 04/09/2007  . Panic disorder 01/04/2006  . Former smoker, stopped smoking in distant past 11/23/2005  . Allergic rhinitis 08/23/2005  . Depressive disorder 08/23/2005  . Impotence of organic origin 08/23/2005  . Migraine without status migrainosus 08/23/2005    Past Surgical History:  Procedure Laterality Date  . SIGMOIDOSCOPY  2008   Dr. Jamal Collin. Normal per patient report    His family history is not on file. He was adopted.      Current Outpatient Medications:  .  Dextromethorphan-guaiFENesin (MUCINEX DM) 30-600 MG TB12, Take 1 tablet by mouth every 12 (twelve) hours as needed., Disp: , Rfl:  .  hydrochlorothiazide (HYDRODIURIL) 25 MG tablet, Take 25 mg by mouth  daily., Disp: , Rfl:  .  hydrocortisone 2.5 % lotion, Apply topically as needed. , Disp: , Rfl:  .  ibuprofen (ADVIL,MOTRIN) 200 MG tablet, Take 200 mg by mouth every 6 (six) hours as needed., Disp: , Rfl:  .  ketoconazole (NIZORAL) 2 % shampoo, Apply 1 application topically 2 (two) times a week., Disp: , Rfl:  .  loratadine (CLARITIN) 10 MG tablet, Take 1 tablet by mouth daily as needed. , Disp: , Rfl:  .  meclizine (ANTIVERT) 25 MG tablet, Take 1 tablet (25 mg total) by mouth 3 (three) times daily as needed for dizziness., Disp: 30 tablet, Rfl: 0 .  Multiple Vitamins-Minerals (EQ COMPLETE MULTIVITAMIN-ADULT PO), Take by mouth., Disp: , Rfl:  .  naproxen (NAPROSYN) 500 MG tablet, Take 1 tablet (500 mg total) by mouth 2 (two) times daily with a meal. As needed for back and leg pain, Disp: 30 tablet, Rfl: 2 .  PARoxetine (PAXIL) 40 MG tablet, Take 1 tablet (40 mg total) by mouth daily., Disp: 90 tablet, Rfl: 3 .  pravastatin (PRAVACHOL) 40 MG tablet, Take 1 tablet (40 mg total) by mouth daily., Disp:  90 tablet, Rfl: 4 .  sildenafil (VIAGRA) 100 MG tablet, Take 1 tablet by mouth daily as needed., Disp: , Rfl:  .  SUMAtriptan (IMITREX) 100 MG tablet, Take 100 mg by mouth every 2 (two) hours as needed for migraine. May repeat in 2 hours if headache persists or recurs., Disp: , Rfl:   Patient Care Team: Birdie Sons, MD as PCP - General (Family Medicine) Carloyn Manner, MD as Referring Physician (Otolaryngology)     Objective:   Vitals: BP 124/85 (BP Location: Left Arm, Patient Position: Sitting, Cuff Size: Large)   Pulse 88   Temp 98 F (36.7 C) (Oral)   Resp 16   Ht 5\' 6"  (1.676 m)   Wt 189 lb (85.7 kg)   SpO2 99% Comment: room air  BMI 30.51 kg/m   Physical Exam   General Appearance:    Alert, cooperative, no distress, appears stated age, overweight  Head:    Normocephalic, without obvious abnormality, atraumatic  Eyes:    PERRL, conjunctiva/corneas clear, EOM's intact, fundi     benign, both eyes       Ears:    Normal TM's and external ear canals, both ears  Nose:   Nares normal, septum midline, mucosa normal, no drainage   or sinus tenderness  Throat:   Lips, mucosa, and tongue normal; teeth and gums normal  Neck:   Supple, symmetrical, trachea midline, no adenopathy;       thyroid:  No enlargement/tenderness/nodules; no carotid   bruit or JVD  Back:     Symmetric, no curvature, ROM normal, no CVA tenderness  Lungs:     Clear to auscultation bilaterally, respirations unlabored  Chest wall:    No tenderness or deformity  Heart:    Regular rate and rhythm, S1 and S2 normal, no murmur, rub   or gallop  Abdomen:     Soft, non-tender, bowel sounds active all four quadrants,    no masses, no organomegaly  Genitalia:    deferred  Rectal:    deferred  Extremities:   Extremities normal, atraumatic, no cyanosis or edema  Pulses:   2+ and symmetric all extremities  Skin:   Skin color, texture, turgor normal, no rashes or lesions  Lymph nodes:   Cervical, supraclavicular, and axillary nodes normal  Neurologic:   CNII-XII intact. Normal strength, sensation and reflexes      throughout    Activities of Daily Living In your present state of health, do you have any difficulty performing the following activities: 09/26/2018  Hearing? Y  Comment Due to current inner ear issues.   Vision? N  Comment Wears readers as needed.   Difficulty concentrating or making decisions? Y  Walking or climbing stairs? Y  Comment Due to right knee pain and left ankle pain.   Dressing or bathing? N  Doing errands, shopping? N  Preparing Food and eating ? N  Using the Toilet? N  In the past six months, have you accidently leaked urine? Y  Comment Occasionally due to diuretic use.   Do you have problems with loss of bowel control? N  Managing your Medications? N  Managing your Finances? N  Housekeeping or managing your Housekeeping? N  Some recent data might be hidden    Fall Risk  Assessment Fall Risk  09/26/2018 09/21/2017 09/21/2016 07/19/2015  Falls in the past year? 1 Yes Yes Yes  Number falls in past yr: 1 2 or more 1 2 or more  Comment - - -  x4  Injury with Fall? 0 (No Data) No No  Comment - muscle strain didnt f/u with doctor, still in pain (left foot) -  Follow up Falls prevention discussed Falls prevention discussed Falls prevention discussed -     Depression Screen PHQ 2/9 Scores 09/26/2018 09/21/2017 09/21/2016 07/19/2015  PHQ - 2 Score 2 5 1 2   PHQ- 9 Score 6 18 - -    No flowsheet data found.    Assessment & Plan:    Annual Physical Reviewed patient's Family Medical History Reviewed and updated list of patient's medical providers Assessment of cognitive impairment was done Assessed patient's functional ability Established a written schedule for health screening [facility] Completed and Reviewed  Exercise Activities and Dietary recommendations Goals    . DIET - REDUCE PORTION SIZE     Recommend cutting portion sizes in half and eating 3 small meals a day with 2 healthy snacks in between.     . Increase water intake     Starting 09/21/16, I will increase my water intake to 5-6 glasses a day.       Immunization History  Administered Date(s) Administered  . Influenza, High Dose Seasonal PF 07/19/2015, 08/03/2016, 06/28/2017, 06/12/2018  . Pneumococcal Conjugate-13 08/24/2014  . Pneumococcal Polysaccharide-23 05/01/2012  . Td 09/26/2003  . Tdap 07/19/2015    Health Maintenance  Topic Date Due  . COLONOSCOPY  08/11/2018  . TETANUS/TDAP  07/18/2025  . INFLUENZA VACCINE  Completed  . Hepatitis C Screening  Completed  . PNA vac Low Risk Adult  Completed     Discussed health benefits of physical activity, and encouraged him to engage in regular exercise appropriate for his age and condition.    ------------------------------------------------------------------------------------------------------------  1.  Annual physical exam Mildly obese, otherwise unremarkable exam  Counseled regarding prudent diet and regular exercise.   - Comprehensive metabolic panel - Lipid panel - PSA  2. Pure hypercholesterolemia He is tolerating pravastatin well with no adverse effects.   - Comprehensive metabolic panel - Lipid panel  3. Panic disorder Well controlled on current regiment of paroxetine which will be continue.  4. Depressive disorder Well controlled, continue current dose of paroxetine.   5. Prostate cancer screening  - PSA  6. Chronic pain of right knee Started after fall a few years. Discussed chronic NSAID use and benefit of OTC PPI if daily NSAID required. Offered orthopedic referral which he declined for now, but may ball back for in the future.   7. BMI 30.0-30.9,adult Counseled regarding prudent diet and regular exercise.    8. Chronic pain of left ankle Secondary to remote fall as above.    Lelon Huh, MD  [facility]

## 2018-10-07 NOTE — Patient Instructions (Addendum)
.   Please bring all of your medications to every appointment so we can make sure that our medication list is the same as yours.    Naproxen can cause stomach irritation if you take it every day. If you do take it every day then you should take Prilosec OTC with it.    The CDC recommends two doses of Shingrix (the shingles vaccine) separated by 2 to 6 months for adults age 72 years and older. I recommend checking with your insurance plan regarding coverage for this vaccine.

## 2018-10-07 NOTE — Telephone Encounter (Signed)
Gavilyte rx has been faxed to [facility] per pt request.

## 2018-10-08 ENCOUNTER — Telehealth: Payer: Self-pay

## 2018-10-08 ENCOUNTER — Encounter: Payer: Self-pay | Admitting: Family Medicine

## 2018-10-08 LAB — COMPREHENSIVE METABOLIC PANEL
ALBUMIN: 4.9 g/dL — AB (ref 3.5–4.8)
ALT: 28 IU/L (ref 0–44)
AST: 20 IU/L (ref 0–40)
Albumin/Globulin Ratio: 2.5 — ABNORMAL HIGH (ref 1.2–2.2)
Alkaline Phosphatase: 151 IU/L — ABNORMAL HIGH (ref 39–117)
BUN / CREAT RATIO: 14 (ref 10–24)
BUN: 18 mg/dL (ref 8–27)
Bilirubin Total: 1.4 mg/dL — ABNORMAL HIGH (ref 0.0–1.2)
CO2: 24 mmol/L (ref 20–29)
CREATININE: 1.3 mg/dL — AB (ref 0.76–1.27)
Calcium: 10.3 mg/dL — ABNORMAL HIGH (ref 8.6–10.2)
Chloride: 97 mmol/L (ref 96–106)
GFR calc non Af Amer: 55 mL/min/{1.73_m2} — ABNORMAL LOW (ref >59)
GFR, EST AFRICAN AMERICAN: 63 mL/min/{1.73_m2} (ref >59)
GLOBULIN, TOTAL: 2 g/dL (ref 1.5–4.5)
GLUCOSE: 88 mg/dL (ref 65–99)
Potassium: 3.9 mmol/L (ref 3.5–5.2)
SODIUM: 141 mmol/L (ref 134–144)
TOTAL PROTEIN: 6.9 g/dL (ref 6.0–8.5)

## 2018-10-08 LAB — PSA: PROSTATE SPECIFIC AG, SERUM: 1.6 ng/mL (ref 0.0–4.0)

## 2018-10-08 LAB — LIPID PANEL
Chol/HDL Ratio: 4.9 ratio (ref 0.0–5.0)
Cholesterol, Total: 187 mg/dL (ref 100–199)
HDL: 38 mg/dL — ABNORMAL LOW (ref >39)
LDL CALC: 115 mg/dL — AB (ref 0–99)
Triglycerides: 169 mg/dL — ABNORMAL HIGH (ref 0–149)
VLDL CHOLESTEROL CAL: 34 mg/dL (ref 5–40)

## 2018-10-08 NOTE — Telephone Encounter (Signed)
Pt advised.   Thanks,   -Viola Placeres  

## 2018-10-08 NOTE — Telephone Encounter (Signed)
-----   Message from Birdie Sons, MD sent at 10/08/2018  8:05 AM EST ----- Cholesterol is up from 138 to 187. Need to get more strict with diet. Continue current dose of pravastatin for now. Psa, kidney and liver functions are all normal. Check labs yearly.

## 2018-10-21 DIAGNOSIS — H8109 Meniere's disease, unspecified ear: Secondary | ICD-10-CM | POA: Diagnosis not present

## 2018-10-21 DIAGNOSIS — H905 Unspecified sensorineural hearing loss: Secondary | ICD-10-CM | POA: Diagnosis not present

## 2018-10-24 ENCOUNTER — Encounter: Payer: Self-pay | Admitting: Family Medicine

## 2018-10-28 ENCOUNTER — Encounter: Payer: Self-pay | Admitting: *Deleted

## 2018-10-29 ENCOUNTER — Ambulatory Visit
Admission: RE | Admit: 2018-10-29 | Discharge: 2018-10-29 | Disposition: A | Discharge status: Home / Self Care | Payer: PPO | Attending: Gastroenterology | Admitting: Gastroenterology

## 2018-10-29 ENCOUNTER — Encounter
Admission: RE | Disposition: A | Discharge status: Home / Self Care | Payer: Self-pay | Source: Home / Self Care | Attending: Gastroenterology

## 2018-10-29 ENCOUNTER — Ambulatory Visit: Payer: PPO | Admitting: Anesthesiology

## 2018-10-29 DIAGNOSIS — D126 Benign neoplasm of colon, unspecified: Secondary | ICD-10-CM | POA: Diagnosis not present

## 2018-10-29 DIAGNOSIS — K641 Second degree hemorrhoids: Secondary | ICD-10-CM | POA: Insufficient documentation

## 2018-10-29 DIAGNOSIS — Z860100 Personal history of colon polyps, unspecified: Secondary | ICD-10-CM

## 2018-10-29 DIAGNOSIS — Z79899 Other long term (current) drug therapy: Secondary | ICD-10-CM | POA: Insufficient documentation

## 2018-10-29 DIAGNOSIS — F329 Major depressive disorder, single episode, unspecified: Secondary | ICD-10-CM | POA: Insufficient documentation

## 2018-10-29 DIAGNOSIS — D122 Benign neoplasm of ascending colon: Secondary | ICD-10-CM | POA: Diagnosis not present

## 2018-10-29 DIAGNOSIS — E785 Hyperlipidemia, unspecified: Secondary | ICD-10-CM | POA: Diagnosis not present

## 2018-10-29 DIAGNOSIS — Z87891 Personal history of nicotine dependence: Secondary | ICD-10-CM | POA: Insufficient documentation

## 2018-10-29 DIAGNOSIS — Z1211 Encounter for screening for malignant neoplasm of colon: Secondary | ICD-10-CM | POA: Insufficient documentation

## 2018-10-29 DIAGNOSIS — D123 Benign neoplasm of transverse colon: Secondary | ICD-10-CM | POA: Diagnosis not present

## 2018-10-29 DIAGNOSIS — E7800 Pure hypercholesterolemia, unspecified: Secondary | ICD-10-CM | POA: Insufficient documentation

## 2018-10-29 DIAGNOSIS — I10 Essential (primary) hypertension: Secondary | ICD-10-CM | POA: Insufficient documentation

## 2018-10-29 DIAGNOSIS — G43909 Migraine, unspecified, not intractable, without status migrainosus: Secondary | ICD-10-CM | POA: Insufficient documentation

## 2018-10-29 DIAGNOSIS — Z791 Long term (current) use of non-steroidal anti-inflammatories (NSAID): Secondary | ICD-10-CM | POA: Diagnosis not present

## 2018-10-29 DIAGNOSIS — F418 Other specified anxiety disorders: Secondary | ICD-10-CM | POA: Diagnosis not present

## 2018-10-29 DIAGNOSIS — F419 Anxiety disorder, unspecified: Secondary | ICD-10-CM | POA: Insufficient documentation

## 2018-10-29 DIAGNOSIS — K635 Polyp of colon: Secondary | ICD-10-CM | POA: Diagnosis not present

## 2018-10-29 HISTORY — DX: Pure hypercholesterolemia, unspecified: E78.00

## 2018-10-29 HISTORY — DX: Essential (primary) hypertension: I10

## 2018-10-29 HISTORY — PX: COLONOSCOPY WITH PROPOFOL: SHX5780

## 2018-10-29 SURGERY — COLONOSCOPY WITH PROPOFOL
Anesthesia: General

## 2018-10-29 MED ORDER — PROPOFOL 10 MG/ML IV BOLUS
INTRAVENOUS | Status: AC
  Filled 2018-10-29: qty 40

## 2018-10-29 MED ORDER — PROPOFOL 500 MG/50ML IV EMUL
INTRAVENOUS | Status: DC | PRN
  Administered 2018-10-29: 150 ug/kg/min via INTRAVENOUS

## 2018-10-29 MED ORDER — PROPOFOL 10 MG/ML IV BOLUS
INTRAVENOUS | Status: DC | PRN
  Administered 2018-10-29: 50 mg via INTRAVENOUS

## 2018-10-29 MED ORDER — SODIUM CHLORIDE 0.9 % IV SOLN
INTRAVENOUS | Status: DC
  Administered 2018-10-29: 08:00:00 via INTRAVENOUS

## 2018-10-29 NOTE — Anesthesia Postprocedure Evaluation (Signed)
Anesthesia Post Note  Patient: Cameron King  Procedure(s) Performed: COLONOSCOPY WITH PROPOFOL (N/A )  Patient location during evaluation: Endoscopy Anesthesia Type: General Level of consciousness: awake and alert Pain management: pain level controlled Vital Signs Assessment: post-procedure vital signs reviewed and stable Respiratory status: spontaneous breathing, nonlabored ventilation, respiratory function stable and patient connected to nasal cannula oxygen Cardiovascular status: blood pressure returned to baseline and stable Postop Assessment: no apparent nausea or vomiting Anesthetic complications: no     Last Vitals:  Vitals:   10/29/18 0725 10/29/18 0809  BP: 121/85 (!) 92/55  Pulse: 91 78  Resp: 16 18  Temp: 36.7 C (!) 36.3 C  SpO2: 98% 93%    Last Pain:  Vitals:   10/29/18 0839  TempSrc:   PainSc: 0-No pain                 Yeilyn Gent S

## 2018-10-29 NOTE — Anesthesia Preprocedure Evaluation (Signed)
Anesthesia Evaluation  Patient identified by MRN, date of birth, ID band Patient awake    Reviewed: Allergy & Precautions, NPO status , Patient's Chart, lab work & pertinent test results, reviewed documented beta blocker date and time   Airway Mallampati: III  TM Distance: >3 FB     Dental  (+) Chipped   Pulmonary former smoker,           Cardiovascular hypertension, Pt. on medications      Neuro/Psych  Headaches, PSYCHIATRIC DISORDERS Anxiety Depression    GI/Hepatic   Endo/Other    Renal/GU      Musculoskeletal   Abdominal   Peds  Hematology   Anesthesia Other Findings   Reproductive/Obstetrics                             Anesthesia Physical Anesthesia Plan  ASA: III  Anesthesia Plan: General   Post-op Pain Management:    Induction: Intravenous  PONV Risk Score and Plan:   Airway Management Planned:   Additional Equipment:   Intra-op Plan:   Post-operative Plan:   Informed Consent: I have reviewed the patients History and Physical, chart, labs and discussed the procedure including the risks, benefits and alternatives for the proposed anesthesia with the patient or authorized representative who has indicated his/her understanding and acceptance.       Plan Discussed with: CRNA  Anesthesia Plan Comments:         Anesthesia Quick Evaluation

## 2018-10-29 NOTE — Transfer of Care (Signed)
Immediate Anesthesia Transfer of Care Note  Patient: Cameron King  Procedure(s) Performed: COLONOSCOPY WITH PROPOFOL (N/A )  Patient Location: Endoscopy Unit  Anesthesia Type:General  Level of Consciousness: awake, alert  and oriented  Airway & Oxygen Therapy: Patient Spontanous Breathing and Patient connected to nasal cannula oxygen  Post-op Assessment: Report given to RN and Post -op Vital signs reviewed and stable  Post vital signs: Reviewed and stable  Last Vitals:  Vitals Value Taken Time  BP    Temp    Pulse    Resp    SpO2      Last Pain:  Vitals:   10/29/18 0725  TempSrc: Tympanic  PainSc: 0-No pain         Complications: No apparent anesthesia complications

## 2018-10-29 NOTE — Anesthesia Post-op Follow-up Note (Signed)
Anesthesia QCDR form completed.        

## 2018-10-29 NOTE — H&P (Signed)
Lucilla Lame, MD [address] [place], [place] [address] Phone:[phone] Fax : [phone]  Primary Care Physician:  Birdie Sons, MD Primary Gastroenterologist:  Dr. Allen Norris  Pre-Procedure History & Physical: HPI:  Cameron King is a 73 y.o. male is here for an colonoscopy.   Past Medical History:  Diagnosis Date  . Depression   . High cholesterol   . Hypertension   . Ocular Migraines   . Shingles 07/16/2015   of eye     Past Surgical History:  Procedure Laterality Date  . COLONOSCOPY    . SIGMOIDOSCOPY  2008   Dr. Jamal Collin. Normal per patient report    Prior to Admission medications   Medication Sig Start Date End Date Taking? Authorizing Provider  hydrocortisone 2.5 % lotion Apply topically as needed.  07/11/17  Yes [provider]  ibuprofen (ADVIL,MOTRIN) 200 MG tablet Take 200 mg by mouth every 6 (six) hours as needed.   Yes [provider]  ketoconazole (NIZORAL) 2 % shampoo Apply 1 application topically 2 (two) times a week.   Yes [provider]  meclizine (ANTIVERT) 25 MG tablet Take 1 tablet (25 mg total) by mouth 3 (three) times daily as needed for dizziness. 06/12/18  Yes Carmon Ginsberg, PA  Multiple Vitamins-Minerals (EQ COMPLETE MULTIVITAMIN-ADULT PO) Take by mouth.   Yes [provider]  naproxen (NAPROSYN) 500 MG tablet Take 1 tablet (500 mg total) by mouth 2 (two) times daily with a meal. As needed for back and leg pain 10/12/17  Yes Birdie Sons, MD  polyethylene glycol-electrolytes (NULYTELY/GOLYTELY) 420 g solution Drink one 8 oz glass every 20 mins until entire container is finished starting at 5:00pm day before procedure 10/07/18  Yes Lucilla Lame, MD  sildenafil (VIAGRA) 100 MG tablet Take 1 tablet by mouth daily as needed. 08/02/09  Yes [provider]  SUMAtriptan (IMITREX) 100 MG tablet Take 100 mg by mouth every 2 (two) hours as needed for migraine. May repeat in 2 hours if headache  persists or recurs.   Yes [provider]  Dextromethorphan-guaiFENesin (MUCINEX DM) 30-600 MG TB12 Take 1 tablet by mouth every 12 (twelve) hours as needed.    [provider]  hydrochlorothiazide (HYDRODIURIL) 25 MG tablet Take 25 mg by mouth daily.    [provider]  loratadine (CLARITIN) 10 MG tablet Take 1 tablet by mouth daily as needed.  08/23/05   [provider]  PARoxetine (PAXIL) 40 MG tablet Take 1 tablet (40 mg total) by mouth daily. 12/07/17   Birdie Sons, MD  pravastatin (PRAVACHOL) 40 MG tablet Take 1 tablet (40 mg total) by mouth daily. 12/03/17   Birdie Sons, MD    Allergies as of 10/01/2018 - Review Complete 09/26/2018  Allergen Reaction Noted  . Prednisone Other (See Comments) 08/14/2018    Family History  Adopted: Yes  Problem Relation Age of Onset  . Cancer - Other Father     Social History   Socioeconomic History  . Marital status: Widowed    Spouse name: Not on file  . Number of children: 2  . Years of education: Not on file  . Highest education level: Associate degree: occupational, Hotel manager, or vocational program  Occupational History  . Occupation: Retired    Comment: Retired 2009  Social Needs  . Financial resource strain: Not hard at all  . Food insecurity:    Worry: Never true    Inability: Never true  .  Transportation needs:    Medical: No    Non-medical: No  Tobacco Use  . Smoking status: Former Smoker    Types: Cigarettes  . Smokeless tobacco: Never Used  . Tobacco comment: quit over 30 years ago  Substance and Sexual Activity  . Alcohol use: Yes    Alcohol/week: 0.0 standard drinks    Comment: occasionally- wine none last 52months  . Drug use: No  . Sexual activity: Not on file  Lifestyle  . Physical activity:    Days per week: 0 days    Minutes per session: 0 min  . Stress: Not at all  Relationships  . Social connections:    Talks on phone: Patient refused    Gets together: Patient  refused    Attends religious service: Patient refused    Active member of club or organization: Patient refused    Attends meetings of clubs or organizations: Patient refused    Relationship status: Patient refused  . Intimate partner violence:    Fear of current or ex partner: No    Emotionally abused: No    Physically abused: No    Forced sexual activity: No  Other Topics Concern  . Not on file  Social History Narrative   Pt currently has a girlfriend who has had a heart attack recently. This has added to his stress level.     Review of Systems: See HPI, otherwise negative ROS  Physical Exam: BP 121/85   Pulse 91   Temp 98.1 F (36.7 C) (Tympanic)   Resp 16   Ht 5\' 6"  (1.676 m)   Wt 83.9 kg   SpO2 98%   BMI 29.86 kg/m  General:   Alert,  pleasant and cooperative in NAD Head:  Normocephalic and atraumatic. Neck:  Supple; no masses or thyromegaly. Lungs:  Clear throughout to auscultation.    Heart:  Regular rate and rhythm. Abdomen:  Soft, nontender and nondistended. Normal bowel sounds, without guarding, and without rebound.   Neurologic:  Alert and  oriented x4;  grossly normal neurologically.  Impression/Plan: Cameron King is here for an colonoscopy to be performed for history of colon polyps 08/11/2013  Risks, benefits, limitations, and alternatives regarding  colonoscopy have been reviewed with the patient.  Questions have been answered.  All parties agreeable.   Lucilla Lame, MD  10/29/2018, 7:35 AM

## 2018-10-29 NOTE — Op Note (Signed)
[facility] Gastroenterology Patient Name: Cameron King Procedure Date: 10/29/2018 7:18 AM MRN: [id number] Account #: [id number] Date of Birth: Oct 15, 1945 Admit Type: Outpatient Age: 73 Room: [facility] ENDO ROOM 4 Gender: Male Note Status: Finalized Procedure:            Colonoscopy Indications:          High risk colon cancer surveillance: Personal history                        of colonic polyps Providers:            Lucilla Lame MD, MD Medicines:            Propofol per Anesthesia Complications:        No immediate complications. Procedure:            Pre-Anesthesia Assessment:                       - Prior to the procedure, a History and Physical was                        performed, and patient medications and allergies were                        reviewed. The patient's tolerance of previous                        anesthesia was also reviewed. The risks and benefits of                        the procedure and the sedation options and risks were                        discussed with the patient. All questions were                        answered, and informed consent was obtained. Prior                        Anticoagulants: The patient has taken no previous                        anticoagulant or antiplatelet agents. ASA Grade                        Assessment: II - A patient with mild systemic disease.                        After reviewing the risks and benefits, the patient was                        deemed in satisfactory condition to undergo the                        procedure.                       After obtaining informed consent, the colonoscope was                        passed under direct vision. Throughout the  procedure,                        the patient's blood pressure, pulse, and oxygen                        saturations were monitored continuously. The                        Colonoscope was introduced through the anus and   advanced to the the terminal ileum. The colonoscopy was                        performed without difficulty. The patient tolerated the                        procedure well. The quality of the bowel preparation                        was excellent. Findings:      The perianal and digital rectal examinations were normal.      A 3 mm polyp was found in the ascending colon. The polyp was sessile.       The polyp was removed with a cold biopsy forceps. Resection and       retrieval were complete.      A 4 mm polyp was found in the transverse colon. The polyp was sessile.       The polyp was removed with a cold biopsy forceps. Resection and       retrieval were complete.      Non-bleeding internal hemorrhoids were found during retroflexion. The       hemorrhoids were Grade II (internal hemorrhoids that prolapse but reduce       spontaneously). Impression:           - One 3 mm polyp in the ascending colon, removed with a                        cold biopsy forceps. Resected and retrieved.                       - One 4 mm polyp in the transverse colon, removed with                        a cold biopsy forceps. Resected and retrieved.                       - Non-bleeding internal hemorrhoids. Recommendation:       - Discharge patient to home.                       - Resume previous diet.                       - Continue present medications.                       - Await pathology results.                       - Repeat colonoscopy in 5 years for surveillance. Procedure Code(s):    --- Professional ---  45380, Colonoscopy, flexible; with biopsy, single or                        multiple Diagnosis Code(s):    --- Professional ---                       Z86.010, Personal history of colonic polyps                       D12.2, Benign neoplasm of ascending colon                       D12.3, Benign neoplasm of transverse colon (hepatic                        flexure or splenic  flexure) CPT copyright 2018 American Medical Association. All rights reserved. The codes documented in this report are preliminary and upon coder review may  be revised to meet current compliance requirements. Lucilla Lame MD, MD 10/29/2018 8:06:33 AM This report has been signed electronically. Number of Addenda: 0 Note Initiated On: 10/29/2018 7:18 AM Scope Withdrawal Time: 0 hours 10 minutes 12 seconds  Total Procedure Duration: 0 hours 15 minutes 19 seconds       [facility]

## 2018-10-30 ENCOUNTER — Encounter: Payer: Self-pay | Admitting: Gastroenterology

## 2018-10-30 LAB — SURGICAL PATHOLOGY

## 2018-11-25 ENCOUNTER — Other Ambulatory Visit: Payer: Self-pay | Admitting: Family Medicine

## 2018-11-25 DIAGNOSIS — F410 Panic disorder [episodic paroxysmal anxiety] without agoraphobia: Secondary | ICD-10-CM

## 2019-03-23 ENCOUNTER — Other Ambulatory Visit: Payer: Self-pay | Admitting: Family Medicine

## 2019-09-23 NOTE — Progress Notes (Signed)
Subjective:   Cameron King is a 73 y.o. male who presents for Medicare Annual/Subsequent preventive examination.    This visit is being conducted through telemedicine due to the COVID-19 pandemic. This patient has given me verbal consent via doximity to conduct this visit, patient states they are participating from their home address. Some vital signs may be absent or patient reported.    Patient identification: identified by name, DOB, and current address  Review of Systems:  N/A  Cardiac Risk Factors include: advanced age (>93men, >68 women);male gender;dyslipidemia     Objective:    Vitals: There were no vitals taken for this visit.  There is no height or weight on file to calculate BMI. Unable to obtain vitals due to visit being conducted via telephonically.   Advanced Directives 09/29/2019 10/29/2018 09/26/2018 09/21/2017 09/21/2016  Does Patient Have a Medical Advance Directive? No No No No No  Would patient like information on creating a medical advance directive? No - Patient declined No - Patient declined No - Patient declined Yes (MAU/Ambulatory/Procedural Areas - Information given) No - Patient declined    Tobacco Social History   Tobacco Use  Smoking Status Former Smoker  . Types: Cigarettes  Smokeless Tobacco Never Used  Tobacco Comment   quit over 30 years ago     Counseling given: Not Answered Comment: quit over 30 years ago   Clinical Intake:  Pre-visit preparation completed: Yes  Pain : No/denies pain Pain Score: 0-No pain     Nutritional Risks: None Diabetes: No  How often do you need to have someone help you when you read instructions, pamphlets, or other written materials from your doctor or pharmacy?: 1 - Never  Interpreter Needed?: No  Information entered by :: [facility], LPN  Past Medical History:  Diagnosis Date  . Depression   . High cholesterol   . Hypertension   . Meniere disease   . Ocular Migraines   . Shingles 07/16/2015   of eye    Past Surgical History:  Procedure Laterality Date  . COLONOSCOPY    . COLONOSCOPY WITH PROPOFOL N/A 10/29/2018   Procedure: COLONOSCOPY WITH PROPOFOL;  Surgeon: Lucilla Lame, MD;  Location: [facility] ENDOSCOPY;  Service: Endoscopy;  Laterality: N/A;  . SIGMOIDOSCOPY  2008   Dr. Jamal Collin. Normal per patient report   Family History  Adopted: Yes  Problem Relation Age of Onset  . Cancer - Other Father    Social History   Socioeconomic History  . Marital status: Widowed    Spouse name: Not on file  . Number of children: 2  . Years of education: Not on file  . Highest education level: Associate degree: occupational, Hotel manager, or vocational program  Occupational History  . Occupation: Retired    Comment: Retired 2009  Tobacco Use  . Smoking status: Former Smoker    Types: Cigarettes  . Smokeless tobacco: Never Used  . Tobacco comment: quit over 30 years ago  Substance and Sexual Activity  . Alcohol use: Yes    Alcohol/week: 0.0 standard drinks    Comment: rare- wine   . Drug use: No  . Sexual activity: Not on file  Other Topics Concern  . Not on file  Social History Narrative   Pt currently has a girlfriend who has had a heart attack recently. This has added to his stress level.    Social Determinants of Health   Financial Resource Strain: Low Risk   . Difficulty of Paying Living Expenses: Not hard  at all  Food Insecurity: No Food Insecurity  . Worried About Charity fundraiser in the Last Year: Never true  . Ran Out of Food in the Last Year: Never true  Transportation Needs: No Transportation Needs  . Lack of Transportation (Medical): No  . Lack of Transportation (Non-Medical): No  Physical Activity: Inactive  . Days of Exercise per Week: 0 days  . Minutes of Exercise per Session: 0 min  Stress: No Stress Concern Present  . Feeling of Stress : Not at all  Social Connections: Moderately Isolated  . Frequency of Communication with Friends and Family: Once a week   . Frequency of Social Gatherings with Friends and Family: Once a week  . Attends Religious Services: 1 to 4 times per year  . Active Member of Clubs or Organizations: No  . Attends Archivist Meetings: Never  . Marital Status: Widowed    Outpatient Encounter Medications as of 09/29/2019  Medication Sig  . Dextromethorphan-guaiFENesin (MUCINEX DM) 30-600 MG TB12 Take 1 tablet by mouth every 12 (twelve) hours as needed.  . hydrochlorothiazide (HYDRODIURIL) 25 MG tablet Take 25 mg by mouth daily.  [name] Kitchen ibuprofen (ADVIL,MOTRIN) 200 MG tablet Take 200 mg by mouth every 6 (six) hours as needed.  . Multiple Vitamins-Minerals (EQ COMPLETE MULTIVITAMIN-ADULT PO) Take by mouth.  . naproxen (NAPROSYN) 500 MG tablet Take 1 tablet (500 mg total) by mouth 2 (two) times daily with a meal. As needed for back and leg pain  . PARoxetine (PAXIL) 40 MG tablet Take 1 tablet by mouth once daily  . pravastatin (PRAVACHOL) 40 MG tablet Take 1 tablet by mouth once daily  . sildenafil (VIAGRA) 100 MG tablet Take 1 tablet by mouth daily as needed.  . SUMAtriptan (IMITREX) 100 MG tablet Take 100 mg by mouth every 2 (two) hours as needed for migraine. May repeat in 2 hours if headache persists or recurs.  . hydrocortisone 2.5 % lotion Apply topically as needed.   [name] Kitchen ketoconazole (NIZORAL) 2 % shampoo Apply 1 application topically 2 (two) times a week.  . loratadine (CLARITIN) 10 MG tablet Take 1 tablet by mouth daily as needed.   . meclizine (ANTIVERT) 25 MG tablet Take 1 tablet (25 mg total) by mouth 3 (three) times daily as needed for dizziness. (Patient not taking: Reported on 09/29/2019)  . polyethylene glycol-electrolytes (NULYTELY/GOLYTELY) 420 g solution Drink one 8 oz glass every 20 mins until entire container is finished starting at 5:00pm day before procedure (Patient not taking: Reported on 09/29/2019)   No facility-administered encounter medications on file as of 09/29/2019.    Activities of Daily Living  In your present state of health, do you have any difficulty performing the following activities: 09/29/2019  Hearing? Y  Comment Has trouble hearing out of right ear. Does not wear hearing aids.  Vision? N  Difficulty concentrating or making decisions? Y  Walking or climbing stairs? N  Dressing or bathing? N  Doing errands, shopping? N  Preparing Food and eating ? N  Using the Toilet? N  In the past six months, have you accidently leaked urine? N  Do you have problems with loss of bowel control? N  Managing your Medications? N  Managing your Finances? N  Housekeeping or managing your Housekeeping? N  Some recent data might be hidden    Patient Care Team: Birdie Sons, MD as PCP - General (Family Medicine) Carloyn Manner, MD as Referring Physician (Otolaryngology) Lucilla Lame, MD as Consulting  Physician (Gastroenterology)   Assessment:   This is a routine wellness examination for Cameron King.  Exercise Activities and Dietary recommendations Current Exercise Habits: The patient does not participate in regular exercise at present, Exercise limited by: None identified  Goals    . DIET - REDUCE PORTION SIZE     Recommend cutting portion sizes in half and eating 3 small meals a day with 2 healthy snacks in between.     . Prevent falls     Recommend to remove any items from the home that may cause slips or trips.       Fall Risk: Fall Risk  09/29/2019 09/26/2018 09/21/2017 09/21/2016 07/19/2015  Falls in the past year? 1 1 Yes Yes Yes  Number falls in past yr: 1 1 2  or more 1 2 or more  Comment - - - - x4  Injury with Fall? 0 0 (No Data) No No  Comment - - muscle strain didnt f/u with doctor, still in pain (left foot) -  Risk for fall due to : Impaired balance/gait - - - -  Follow up Falls prevention discussed Falls prevention discussed Falls prevention discussed Falls prevention discussed -    FALL RISK PREVENTION PERTAINING TO THE HOME:  Any stairs in or around the home?  Yes  If so, are there any without handrails? No   Home free of loose throw rugs in walkways, pet beds, electrical cords, etc? Yes  Adequate lighting in your home to reduce risk of falls? Yes   ASSISTIVE DEVICES UTILIZED TO PREVENT FALLS:  Life alert? No  Use of a cane, walker or w/c? No  Grab bars in the bathroom? Yes  Shower chair or bench in shower? No  Elevated toilet seat or a handicapped toilet? No   TIMED UP AND GO:  Was the test performed? No .    Depression Screen PHQ 2/9 Scores 09/29/2019 09/29/2019 09/26/2018 09/21/2017  PHQ - 2 Score 0 0 2 5  PHQ- 9 Score - - 6 18    Cognitive Function: Declined today.        Immunization History  Administered Date(s) Administered  . Influenza, High Dose Seasonal PF 07/19/2015, 08/03/2016, 06/28/2017, 06/12/2018  . Influenza,inj,Quad PF,6+ Mos 07/28/2019  . Pneumococcal Conjugate-13 08/24/2014  . Pneumococcal Polysaccharide-23 05/01/2012  . Td 09/26/2003  . Tdap 07/19/2015    Qualifies for Shingles Vaccine? Yes . Due for Shingrix. Pt has been advised to call insurance company to determine out of pocket expense. Advised may also receive vaccine at local pharmacy or Health Dept. Verbalized acceptance and understanding.  Tdap: Up to date  Flu Vaccine: Up to date  Pneumococcal Vaccine: Completed series  Screening Tests Health Maintenance  Topic Date Due  . COLONOSCOPY  10/30/2023  . TETANUS/TDAP  07/18/2025  . INFLUENZA VACCINE  Completed  . Hepatitis C Screening  Completed  . PNA vac Low Risk Adult  Completed   Cancer Screenings:  Colorectal Screening: Completed 10/29/18. Repeat every 5 years.   Lung Cancer Screening: (Low Dose CT Chest recommended if Age 27-80 years, 30 pack-year currently smoking OR have quit w/in 15years.) does not qualify.   Additional Screening:  Hepatitis C Screening: Up to date  Vision Screening: Recommended annual ophthalmology exams for early detection of glaucoma and other disorders of the  eye.  Dental Screening: Recommended annual dental exams for proper oral hygiene  Community Resource Referral:  CRR required this visit?  No        Plan:  I  have personally reviewed and addressed the Medicare Annual Wellness questionnaire and have noted the following in the patient's chart:  A. Medical and social history B. Use of alcohol, tobacco or illicit drugs  C. Current medications and supplements D. Functional ability and status E.  Nutritional status F.  Physical activity G. Advance directives H. List of other physicians I.  Hospitalizations, surgeries, and ER visits in previous 12 months J.  [place] such as hearing and vision if needed, cognitive and depression L. Referrals and appointments   In addition, I have reviewed and discussed with patient certain preventive protocols, quality metrics, and best practice recommendations. A written personalized care plan for preventive services as well as general preventive health recommendations were provided to patient.   Glendora Score, LPN  [id number] Nurse Health Advisor   Nurse Notes: None.

## 2019-09-29 ENCOUNTER — Other Ambulatory Visit: Payer: Self-pay

## 2019-09-29 ENCOUNTER — Ambulatory Visit (INDEPENDENT_AMBULATORY_CARE_PROVIDER_SITE_OTHER): Payer: PPO

## 2019-09-29 DIAGNOSIS — Z0000 Encounter for general adult medical examination without abnormal findings: Secondary | ICD-10-CM

## 2019-09-29 NOTE — Patient Instructions (Signed)
Cameron King , Thank you for taking time to come for your Medicare Wellness Visit. I appreciate your ongoing commitment to your health goals. Please review the following plan we discussed and let me know if I can assist you in the future.   Screening recommendations/referrals: Colonoscopy: Up to date, due 10/2023 Recommended yearly ophthalmology/optometry visit for glaucoma screening and checkup Recommended yearly dental visit for hygiene and checkup  Vaccinations: Influenza vaccine: Up to date Pneumococcal vaccine: Completed series Tdap vaccine: Up to date, due 06/2025 Shingles vaccine: Pt declines today.     Advanced directives: Advance directive discussed with you today. Even though you declined this today please call our office should you change your mind and we can give you the proper paperwork for you to fill out.  Conditions/risks identified: Fall risk prevention discussed today. Recommended to focus on eating a more healthy diet.   Next appointment: 12/01/19 @ 9:00 AM with Dr Caryn Section.   Preventive Care 20 Years and Older, Male Preventive care refers to lifestyle choices and visits with your health care provider that can promote health and wellness. What does preventive care include?  A yearly physical exam. This is also called an annual well check.  Dental exams once or twice a year.  Routine eye exams. Ask your health care provider how often you should have your eyes checked.  Personal lifestyle choices, including:  Daily care of your teeth and gums.  Regular physical activity.  Eating a healthy diet.  Avoiding tobacco and drug use.  Limiting alcohol use.  Practicing safe sex.  Taking low doses of aspirin every day.  Taking vitamin and mineral supplements as recommended by your health care provider. What happens during an annual well check? The services and screenings done by your health care provider during your annual well check will depend on your age, overall  health, lifestyle risk factors, and family history of disease. Counseling  Your health care provider may ask you questions about your:  Alcohol use.  Tobacco use.  Drug use.  Emotional well-being.  Home and relationship well-being.  Sexual activity.  Eating habits.  History of falls.  Memory and ability to understand (cognition).  Work and work Statistician. Screening  You may have the following tests or measurements:  Height, weight, and BMI.  Blood pressure.  Lipid and cholesterol levels. These may be checked every 5 years, or more frequently if you are over 12 years old.  Skin check.  Lung cancer screening. You may have this screening every year starting at age 33 if you have a 30-pack-year history of smoking and currently smoke or have quit within the past 15 years.  Fecal occult blood test (FOBT) of the stool. You may have this test every year starting at age 42.  Flexible sigmoidoscopy or colonoscopy. You may have a sigmoidoscopy every 5 years or a colonoscopy every 10 years starting at age 41.  Prostate cancer screening. Recommendations will vary depending on your family history and other risks.  Hepatitis C blood test.  Hepatitis B blood test.  Sexually transmitted disease (STD) testing.  Diabetes screening. This is done by checking your blood sugar (glucose) after you have not eaten for a while (fasting). You may have this done every 1-3 years.  Abdominal aortic aneurysm (AAA) screening. You may need this if you are a current or former smoker.  Osteoporosis. You may be screened starting at age 68 if you are at high risk. Talk with your health care provider about your  test results, treatment options, and if necessary, the need for more tests. Vaccines  Your health care provider may recommend certain vaccines, such as:  Influenza vaccine. This is recommended every year.  Tetanus, diphtheria, and acellular pertussis (Tdap, Td) vaccine. You may need a Td  booster every 10 years.  Zoster vaccine. You may need this after age 59.  Pneumococcal 13-valent conjugate (PCV13) vaccine. One dose is recommended after age 35.  Pneumococcal polysaccharide (PPSV23) vaccine. One dose is recommended after age 59. Talk to your health care provider about which screenings and vaccines you need and how often you need them. This information is not intended to replace advice given to you by your health care provider. Make sure you discuss any questions you have with your health care provider. Document Released: 10/08/2015 Document Revised: 05/31/2016 Document Reviewed: 07/13/2015 Elsevier Interactive Patient Education  2017 [employer] Prevention in the Home Falls can cause injuries. They can happen to people of all ages. There are many things you can do to make your home safe and to help prevent falls. What can I do on the outside of my home?  Regularly fix the edges of walkways and driveways and fix any cracks.  Remove anything that might make you trip as you walk through a door, such as a raised step or threshold.  Trim any bushes or trees on the path to your home.  Use bright outdoor lighting.  Clear any walking paths of anything that might make someone trip, such as rocks or tools.  Regularly check to see if handrails are loose or broken. Make sure that both sides of any steps have handrails.  Any raised decks and porches should have guardrails on the edges.  Have any leaves, snow, or ice cleared regularly.  Use sand or salt on walking paths during winter.  Clean up any spills in your garage right away. This includes oil or grease spills. What can I do in the bathroom?  Use night lights.  Install grab bars by the toilet and in the tub and shower. Do not use towel bars as grab bars.  Use non-skid mats or decals in the tub or shower.  If you need to sit down in the shower, use a plastic, non-slip stool.  Keep the floor dry. Clean up  any water that spills on the floor as soon as it happens.  Remove soap buildup in the tub or shower regularly.  Attach bath mats securely with double-sided non-slip rug tape.  Do not have throw rugs and other things on the floor that can make you trip. What can I do in the bedroom?  Use night lights.  Make sure that you have a light by your bed that is easy to reach.  Do not use any sheets or blankets that are too big for your bed. They should not hang down onto the floor.  Have a firm chair that has side arms. You can use this for support while you get dressed.  Do not have throw rugs and other things on the floor that can make you trip. What can I do in the kitchen?  Clean up any spills right away.  Avoid walking on wet floors.  Keep items that you use a lot in easy-to-reach places.  If you need to reach something above you, use a strong step stool that has a grab bar.  Keep electrical cords out of the way.  Do not use floor polish or wax that  makes floors slippery. If you must use wax, use non-skid floor wax.  Do not have throw rugs and other things on the floor that can make you trip. What can I do with my stairs?  Do not leave any items on the stairs.  Make sure that there are handrails on both sides of the stairs and use them. Fix handrails that are broken or loose. Make sure that handrails are as long as the stairways.  Check any carpeting to make sure that it is firmly attached to the stairs. Fix any carpet that is loose or worn.  Avoid having throw rugs at the top or bottom of the stairs. If you do have throw rugs, attach them to the floor with carpet tape.  Make sure that you have a light switch at the top of the stairs and the bottom of the stairs. If you do not have them, ask someone to add them for you. What else can I do to help prevent falls?  Wear shoes that:  Do not have high heels.  Have rubber bottoms.  Are comfortable and fit you well.  Are  closed at the toe. Do not wear sandals.  If you use a stepladder:  Make sure that it is fully opened. Do not climb a closed stepladder.  Make sure that both sides of the stepladder are locked into place.  Ask someone to hold it for you, if possible.  Clearly mark and make sure that you can see:  Any grab bars or handrails.  First and last steps.  Where the edge of each step is.  Use tools that help you move around (mobility aids) if they are needed. These include:  Canes.  Walkers.  Scooters.  Crutches.  Turn on the lights when you go into a dark area. Replace any light bulbs as soon as they burn out.  Set up your furniture so you have a clear path. Avoid moving your furniture around.  If any of your floors are uneven, fix them.  If there are any pets around you, be aware of where they are.  Review your medicines with your doctor. Some medicines can make you feel dizzy. This can increase your chance of falling. Ask your doctor what other things that you can do to help prevent falls. This information is not intended to replace advice given to you by your health care provider. Make sure you discuss any questions you have with your health care provider. Document Released: 07/08/2009 Document Revised: 02/17/2016 Document Reviewed: 10/16/2014 Elsevier Interactive Patient Education  2017 [employer].

## 2019-11-07 ENCOUNTER — Ambulatory Visit: Payer: PPO | Attending: Internal Medicine

## 2019-11-07 DIAGNOSIS — Z23 Encounter for immunization: Secondary | ICD-10-CM

## 2019-11-07 NOTE — Progress Notes (Signed)
   Covid-19 Vaccination Clinic  Name:  Cameron King    MRN: [id number] DOB: [birth year]  11/07/2019  [name] was observed post Covid-19 immunization for 15 minutes without incidence. He was provided with Vaccine Information Sheet and instruction to access the V-Safe system.   [name] was instructed to call 911 with any severe reactions post vaccine: [name] Kitchen Difficulty breathing  . Swelling of your face and throat  . A fast heartbeat  . A bad rash all over your body  . Dizziness and weakness    Immunizations Administered    Name Date Dose VIS Date Route   Pfizer COVID-19 Vaccine 11/07/2019 12:30 PM 0.3 mL 09/05/2019 Intramuscular   Manufacturer: [place]   Lot: [id number]   [place]: [id number]

## 2019-12-01 ENCOUNTER — Encounter: Payer: Self-pay | Admitting: Family Medicine

## 2019-12-01 ENCOUNTER — Other Ambulatory Visit: Payer: Self-pay

## 2019-12-01 ENCOUNTER — Ambulatory Visit (INDEPENDENT_AMBULATORY_CARE_PROVIDER_SITE_OTHER): Payer: PPO | Admitting: Family Medicine

## 2019-12-01 VITALS — BP 107/71 | HR 88 | Temp 96.2°F | Ht 65.0 in | Wt 195.6 lb

## 2019-12-01 DIAGNOSIS — F329 Major depressive disorder, single episode, unspecified: Secondary | ICD-10-CM | POA: Diagnosis not present

## 2019-12-01 DIAGNOSIS — Z125 Encounter for screening for malignant neoplasm of prostate: Secondary | ICD-10-CM

## 2019-12-01 DIAGNOSIS — Z0000 Encounter for general adult medical examination without abnormal findings: Secondary | ICD-10-CM | POA: Diagnosis not present

## 2019-12-01 DIAGNOSIS — S93401A Sprain of unspecified ligament of right ankle, initial encounter: Secondary | ICD-10-CM | POA: Diagnosis not present

## 2019-12-01 DIAGNOSIS — F32A Depression, unspecified: Secondary | ICD-10-CM

## 2019-12-01 DIAGNOSIS — E7800 Pure hypercholesterolemia, unspecified: Secondary | ICD-10-CM | POA: Diagnosis not present

## 2019-12-01 DIAGNOSIS — F410 Panic disorder [episodic paroxysmal anxiety] without agoraphobia: Secondary | ICD-10-CM | POA: Diagnosis not present

## 2019-12-01 NOTE — Progress Notes (Signed)
Patient: Cameron King, Male    DOB: Nov 30, 1945, 74 y.o.   MRN: [id number] Visit Date: 12/01/2019  Today's Provider: Lelon Huh, MD   Chief Complaint  Patient presents with  . Annual Exam  . Hyperlipidemia  . Panic disorder   Subjective:     Patient had a AWE with McKenzie on 09/29/2019   Annual physical exam Cameron King is a 74 y.o. male who presents today for health maintenance and complete physical. He feels fairly well. He reports exercising none. He reports he is sleeping poorly.  -----------------------------------------------------------------  Lipid/Cholesterol, Follow-up:   Last seen for this 1 years ago.  Management changes since that visit include none.  Last Lipid Panel: Lab Results  Component Value Date   CHOL 187 10/07/2018   HDL 38 (L) 10/07/2018   LDLCALC 115 (H) 10/07/2018   TRIG 169 (H) 10/07/2018   CHOLHDL 4.9 10/07/2018   Risk factors for vascular disease include hypercholesterolemia  He reports good compliance with treatment. He is not having side effects.  Current symptoms include none  Weight trend: decreasing steadily Prior visit with dietician: no Current diet: in general, a "healthy" diet   Current exercise: none     Wt Readings from Last 3 Encounters:  10/07/18 189 lb (85.7 kg)  09/26/18 192 lb (87.1 kg)  07/09/18 197 lb (89.4 kg)    -------------------------------------------------------------------  Follow up for Panic Disorder:  The patient was last seen for this 1 year ago. Changes made at last visit include none.  He reports good compliance with treatment. He feels that condition is stable. He is not having side effects.   ------------------------------------------------------------------------------------  Complains of right ankle pain from injury several years ago causing difficulty with balance and causing several falls.    Review of Systems  Constitutional: Negative.   HENT: Negative.     Eyes: Positive for discharge.  Respiratory: Negative.   Cardiovascular: Negative.   Gastrointestinal: Positive for constipation.  Endocrine: Negative.   Genitourinary: Negative.   Musculoskeletal: Positive for gait problem and neck pain.  Skin: Negative.   Allergic/Immunologic: Negative.   Neurological: Positive for weakness and headaches.  Hematological: Negative.   Psychiatric/Behavioral: Positive for agitation, confusion, decreased concentration, dysphoric mood and sleep disturbance. The patient is nervous/anxious.     Social History      He  reports that he has quit smoking. His smoking use included cigarettes. He has never used smokeless tobacco. He reports current alcohol use. He reports that he does not use drugs.       Social History   Socioeconomic History  . Marital status: Widowed    Spouse name: Not on file  . Number of children: 2  . Years of education: Not on file  . Highest education level: Associate degree: occupational, Hotel manager, or vocational program  Occupational History  . Occupation: Retired    Comment: Retired 2009  Tobacco Use  . Smoking status: Former Smoker    Types: Cigarettes  . Smokeless tobacco: Never Used  . Tobacco comment: quit over 30 years ago  Substance and Sexual Activity  . Alcohol use: Yes    Alcohol/week: 0.0 standard drinks    Comment: rare- wine   . Drug use: No  . Sexual activity: Not on file  Other Topics Concern  . Not on file  Social History Narrative   Pt currently has a girlfriend who has had a heart attack recently. This has added to his stress level.  Social Determinants of Health   Financial Resource Strain: Low Risk   . Difficulty of Paying Living Expenses: Not hard at all  Food Insecurity: No Food Insecurity  . Worried About Charity fundraiser in the Last Year: Never true  . Ran Out of Food in the Last Year: Never true  Transportation Needs: No Transportation Needs  . Lack of Transportation (Medical): No  .  Lack of Transportation (Non-Medical): No  Physical Activity: Inactive  . Days of Exercise per Week: 0 days  . Minutes of Exercise per Session: 0 min  Stress: No Stress Concern Present  . Feeling of Stress : Not at all  Social Connections: Moderately Isolated  . Frequency of Communication with Friends and Family: Once a week  . Frequency of Social Gatherings with Friends and Family: Once a week  . Attends Religious Services: 1 to 4 times per year  . Active Member of Clubs or Organizations: No  . Attends Archivist Meetings: Never  . Marital Status: Widowed    Past Medical History:  Diagnosis Date  . Depression   . High cholesterol   . Hypertension   . Meniere disease   . Ocular Migraines   . Shingles 07/16/2015   of eye      Patient Active Problem List   Diagnosis Date Noted  . History of colonic polyps   . Benign neoplasm of ascending colon   . Benign neoplasm of transverse colon   . Chronic pain of left ankle 10/07/2018  . Bilateral hearing loss 08/21/2018  . Cerebral ventriculomegaly 08/21/2018  . Chronic pain of right knee 10/02/2016  . Enthesopathy of hip 08/02/2009  . Rotator cuff syndrome 10/04/2008  . Chronic infection of sinus 03/30/2008  . HLD (hyperlipidemia) 04/09/2007  . Panic disorder 01/04/2006  . Former smoker, stopped smoking in distant past 11/23/2005  . Allergic rhinitis 08/23/2005  . Depressive disorder 08/23/2005  . Impotence of organic origin 08/23/2005  . Migraine without status migrainosus 08/23/2005    Past Surgical History:  Procedure Laterality Date  . COLONOSCOPY    . COLONOSCOPY WITH PROPOFOL N/A 10/29/2018   Procedure: COLONOSCOPY WITH PROPOFOL;  Surgeon: Lucilla Lame, MD;  Location[facility] ENDOSCOPY;  Service: Endoscopy;  Laterality: N/A;  . SIGMOIDOSCOPY  2008   Dr. Jamal Collin. Normal per patient report    Family History        Family Status  Relation Name Status  . Father  Deceased       died of esophageal cance         His family history includes Cancer - Other in his father. He was adopted.      Allergies  Allergen Reactions  . Prednisone Other (See Comments)    Vomiting     Current Outpatient Medications:  .  ibuprofen (ADVIL,MOTRIN) 200 MG tablet, Take 200 mg by mouth every 6 (six) hours as needed., Disp: , Rfl:  .  loratadine (CLARITIN) 10 MG tablet, Take 1 tablet by mouth daily as needed. , Disp: , Rfl:  .  Multiple Vitamins-Minerals (EQ COMPLETE MULTIVITAMIN-ADULT PO), Take by mouth., Disp: , Rfl:  .  naproxen (NAPROSYN) 500 MG tablet, Take 1 tablet (500 mg total) by mouth 2 (two) times daily with a meal. As needed for back and leg pain, Disp: 30 tablet, Rfl: 2 .  PARoxetine (PAXIL) 40 MG tablet, Take 1 tablet by mouth once daily, Disp: 90 tablet, Rfl: 4 .  pravastatin (PRAVACHOL) 40 MG tablet, Take 1 tablet  by mouth once daily, Disp: 90 tablet, Rfl: 4 .  sildenafil (VIAGRA) 100 MG tablet, Take 1 tablet by mouth daily as needed., Disp: , Rfl:  .  SUMAtriptan (IMITREX) 100 MG tablet, Take 100 mg by mouth every 2 (two) hours as needed for migraine. May repeat in 2 hours if headache persists or recurs., Disp: , Rfl:  .  hydrochlorothiazide (HYDRODIURIL) 25 MG tablet, Take 25 mg by mouth daily., Disp: , Rfl: (not taking) .  hydrocortisone 2.5 % lotion, Apply topically as needed. , Disp: , Rfl:    Patient Care Team: Birdie Sons, MD as PCP - General (Family Medicine) Carloyn Manner, MD as Referring Physician (Otolaryngology) Lucilla Lame, MD as Consulting Physician (Gastroenterology)    Objective:    Vitals: BP 107/71 (BP Location: Right Arm, Patient Position: Sitting, Cuff Size: Normal)   Pulse 88   Temp (!) 96.2 F (35.7 C) (Temporal)   Ht 5\' 5"  (1.651 m)   Wt 195 lb 9.6 oz (88.7 kg)   BMI 32.55 kg/m    Vitals:   12/01/19 0905  BP: 107/71  Pulse: 88  Temp: (!) 96.2 F (35.7 C)  TempSrc: Temporal  Weight: 195 lb 9.6 oz (88.7 kg)  Height: 5\' 5"  (1.651 m)     Physical  Exam    General Appearance:    Overweight male. Alert, cooperative, in no acute distress, appears stated age  Head:    Normocephalic, without obvious abnormality, atraumatic  Eyes:    PERRL, conjunctiva/corneas clear, EOM's intact, fundi    benign, both eyes       Ears:    Normal TM's and external ear canals, both ears  Nose:   Nares normal, septum midline, mucosa normal, no drainage   or sinus tenderness  Throat:   Lips, mucosa, and tongue normal; teeth and gums normal  Neck:   Supple, symmetrical, trachea midline, no adenopathy;       thyroid:  No enlargement/tenderness/nodules; no carotid   bruit or JVD  Back:     Symmetric, no curvature, ROM normal, no CVA tenderness  Lungs:     Clear to auscultation bilaterally, respirations unlabored  Chest wall:    No tenderness or deformity  Heart:    Normal heart rate. Normal rhythm. No murmurs, rubs, or gallops.  S1 and S2 normal  Abdomen:     Soft, non-tender, bowel sounds active all four quadrants,    no masses, no organomegaly  Genitalia:    deferred  Rectal:    deferred  Extremities:   All extremities are intact. No cyanosis or edema  Pulses:   2+ and symmetric all extremities  Skin:   Skin color, texture, turgor normal, no rashes or lesions  Lymph nodes:   Cervical, supraclavicular, and axillary nodes normal  Neurologic:   CNII-XII intact. Normal strength, sensation and reflexes      throughout     Depression Screen PHQ 2/9 Scores 09/29/2019 09/29/2019 09/26/2018 09/21/2017  PHQ - 2 Score 0 0 2 5  PHQ- 9 Score - - 6 18       Assessment & Plan:     Routine Health Maintenance and Physical Exam  Exercise Activities and Dietary recommendations Goals    . DIET - REDUCE PORTION SIZE     Recommend cutting portion sizes in half and eating 3 small meals a day with 2 healthy snacks in between.     . Prevent falls     Recommend to remove any items from  the home that may cause slips or trips.       Immunization History   Administered Date(s) Administered  . Influenza, High Dose Seasonal PF 07/19/2015, 08/03/2016, 06/28/2017, 06/12/2018  . Influenza,inj,Quad PF,6+ Mos 07/28/2019  . PFIZER SARS-COV-2 Vaccination 11/07/2019  . Pneumococcal Conjugate-13 08/24/2014  . Pneumococcal Polysaccharide-23 05/01/2012  . Td 09/26/2003  . Tdap 07/19/2015    Health Maintenance  Topic Date Due  . COLONOSCOPY  10/30/2023  . TETANUS/TDAP  07/18/2025  . INFLUENZA VACCINE  Completed  . Hepatitis C Screening  Completed  . PNA vac Low Risk Adult  Completed     Discussed health benefits of physical activity, and encouraged him to engage in regular exercise appropriate for his age and condition.    --------------------------------------------------------------------   1. Prostate cancer screening  - PSA  2. Pure hypercholesterolemia He is tolerating pravastatin well with no adverse effects.   - CBC - Comprehensive metabolic panel - Lipid panel - TSH  3. Panic disorder Stable on current SSRI  4. Depressive disorder Continue paroxetine  5. . Sprain of right ankle, unspecified ligament, initial encounter Encouraged to wear elastic ankle brace throughout the day as long as he is ambulating.    Lelon Huh, MD  [facility]

## 2019-12-01 NOTE — Patient Instructions (Addendum)
.   Please review the attached list of medications and notify my office if there are any errors.   . Please bring all of your medications to every appointment so we can make sure that our medication list is the same as yours.    The CDC recommends two doses of Shingrix (the shingles vaccine) separated by 2 to 6 months for adults age 73 years and older. I recommend checking with your insurance plan regarding coverage for this vaccine.          You need to wait at least 2 weeks after getting your second Covid-19 vaccine before getting any other vaccines.  You should wear an ankle brace throughout the day to give your ankle more support and reduce the risk for falls.

## 2019-12-02 LAB — COMPREHENSIVE METABOLIC PANEL
ALT: 23 IU/L (ref 0–44)
AST: 30 IU/L (ref 0–40)
Albumin/Globulin Ratio: 2.8 — ABNORMAL HIGH (ref 1.2–2.2)
Albumin: 5.1 g/dL — ABNORMAL HIGH (ref 3.7–4.7)
Alkaline Phosphatase: 146 IU/L — ABNORMAL HIGH (ref 39–117)
BUN/Creatinine Ratio: 14 (ref 10–24)
BUN: 17 mg/dL (ref 8–27)
Bilirubin Total: 1.4 mg/dL — ABNORMAL HIGH (ref 0.0–1.2)
CO2: 23 mmol/L (ref 20–29)
Calcium: 9.9 mg/dL (ref 8.6–10.2)
Chloride: 100 mmol/L (ref 96–106)
Creatinine, Ser: 1.2 mg/dL (ref 0.76–1.27)
GFR calc Af Amer: 69 mL/min/{1.73_m2} (ref >59)
GFR calc non Af Amer: 60 mL/min/{1.73_m2} (ref >59)
Globulin, Total: 1.8 g/dL (ref 1.5–4.5)
Glucose: 71 mg/dL (ref 65–99)
Potassium: 4.1 mmol/L (ref 3.5–5.2)
Sodium: 141 mmol/L (ref 134–144)
Total Protein: 6.9 g/dL (ref 6.0–8.5)

## 2019-12-02 LAB — CBC
Hematocrit: 46.7 % (ref 37.5–51.0)
Hemoglobin: 16.1 g/dL (ref 13.0–17.7)
MCH: 29.7 pg (ref 26.6–33.0)
MCHC: 34.5 g/dL (ref 31.5–35.7)
MCV: 86 fL (ref 79–97)
Platelets: 237 10*3/uL (ref 150–450)
RBC: 5.42 x10E6/uL (ref 4.14–5.80)
RDW: 12.2 % (ref 11.6–15.4)
WBC: 7.5 10*3/uL (ref 3.4–10.8)

## 2019-12-02 LAB — LIPID PANEL
Chol/HDL Ratio: 3.5 ratio (ref 0.0–5.0)
Cholesterol, Total: 155 mg/dL (ref 100–199)
HDL: 44 mg/dL (ref >39)
LDL Chol Calc (NIH): 92 mg/dL (ref 0–99)
Triglycerides: 106 mg/dL (ref 0–149)
VLDL Cholesterol Cal: 19 mg/dL (ref 5–40)

## 2019-12-02 LAB — PSA: Prostate Specific Ag, Serum: 1.6 ng/mL (ref 0.0–4.0)

## 2019-12-02 LAB — TSH: TSH: 1.66 u[IU]/mL (ref 0.450–4.500)

## 2019-12-03 ENCOUNTER — Ambulatory Visit: Payer: PPO | Attending: Internal Medicine

## 2019-12-03 DIAGNOSIS — Z23 Encounter for immunization: Secondary | ICD-10-CM

## 2019-12-03 NOTE — Progress Notes (Signed)
   Covid-19 Vaccination Clinic  Name:  Cameron King    MRN: [id number] DOB: 03-Aug-1946  12/03/2019  [name] was observed post Covid-19 immunization for 15 minutes without incident. He was provided with Vaccine Information Sheet and instruction to access the V-Safe system.   [name] was instructed to call 911 with any severe reactions post vaccine: [name] Kitchen Difficulty breathing  . Swelling of face and throat  . A fast heartbeat  . A bad rash all over body  . Dizziness and weakness   Immunizations Administered    Name Date Dose VIS Date Route   Pfizer COVID-19 Vaccine 12/03/2019 12:27 PM 0.3 mL 09/05/2019 Intramuscular   Manufacturer: [place]   Lot: [id number]   [place]: [id number]

## 2020-02-04 ENCOUNTER — Encounter: Payer: Self-pay | Admitting: Family Medicine

## 2020-02-04 ENCOUNTER — Telehealth: Payer: Self-pay | Admitting: *Deleted

## 2020-02-04 ENCOUNTER — Other Ambulatory Visit: Payer: Self-pay

## 2020-02-04 ENCOUNTER — Ambulatory Visit (INDEPENDENT_AMBULATORY_CARE_PROVIDER_SITE_OTHER): Payer: PPO | Admitting: Family Medicine

## 2020-02-04 ENCOUNTER — Ambulatory Visit: Payer: Self-pay | Admitting: Physician Assistant

## 2020-02-04 VITALS — BP 136/86 | HR 70 | Temp 96.2°F | Wt 202.2 lb

## 2020-02-04 DIAGNOSIS — R410 Disorientation, unspecified: Secondary | ICD-10-CM

## 2020-02-04 DIAGNOSIS — F411 Generalized anxiety disorder: Secondary | ICD-10-CM | POA: Diagnosis not present

## 2020-02-04 DIAGNOSIS — F329 Major depressive disorder, single episode, unspecified: Secondary | ICD-10-CM

## 2020-02-04 DIAGNOSIS — F32A Depression, unspecified: Secondary | ICD-10-CM

## 2020-02-04 MED ORDER — BUSPIRONE HCL 5 MG PO TABS: 5.0000 mg | ORAL_TABLET | Freq: Three times a day (TID) | ORAL | 1 refills | Status: DC

## 2020-02-04 MED ORDER — SERTRALINE HCL 100 MG PO TABS: 100.0000 mg | ORAL_TABLET | Freq: Every day | ORAL | 1 refills | Status: DC

## 2020-02-04 NOTE — Assessment & Plan Note (Signed)
Seems to be having anticholinergic side effects from paxil and benadryl Discontinue these medications If confusion persists consider dementia work up

## 2020-02-04 NOTE — Assessment & Plan Note (Signed)
Uncontrolled  May be having anticholinergic side effects from Paxil Stop Paxil Start Zoloft at 100 mg daily, consider increasing to 150 mg Contracted for safety  Follow up in 1 month and repeat PHQ-9

## 2020-02-04 NOTE — Progress Notes (Signed)
[name],acting as a scribe for Lavon Paganini, MD.,have documented all relevant documentation on the behalf of Lavon Paganini, MD,as directed by  Lavon Paganini, MD while in the presence of Lavon Paganini, MD.  Established patient visit   Patient: Cameron King   DOB: Aug 30, 1946   74 y.o. Male  MRN: [id number] Visit Date: 02/04/2020  Today's healthcare provider: Lavon Paganini, MD   Chief Complaint  Patient presents with  . Anxiety   Subjective    HPI  Anxiety, Follow-up  He was last seen for anxiety 2 months ago. Changes made at last visit include no changes.   He reports good compliance with treatment. He reports poor tolerance of treatment. He is not having side effects.   He feels his anxiety is severe and Worse since last visit.  Symptoms: No chest pain Yes difficulty concentrating  No dizziness Yes fatigue  Yes feelings of losing control Yes insomnia  Yes irritable No palpitations  Yes panic attacks No racing thoughts  No shortness of breath Yes sweating  Yes tremors/shakes    GAD-7 Results GAD-7 Generalized Anxiety Disorder Screening Tool 02/04/2020  1. Feeling Nervous, Anxious, or on Edge 2  2. Not Being Able to Stop or Control Worrying 2  3. Worrying Too Much About Different Things 2  4. Trouble Relaxing 2  5. Being So Restless it's Hard To Sit Still 1  6. Becoming Easily Annoyed or Irritable 2  7. Feeling Afraid As If Something Awful Might Happen 2  Total GAD-7 Score 13  Difficulty At Work, Home, or Getting  Along With Others? Very difficult    PHQ-9 Scores PHQ9 SCORE ONLY 02/04/2020 12/01/2019 09/29/2019  Score 19 17 0    ---------------------------------------------------------------------------------------------------  Social History   Tobacco Use  . Smoking status: Former Smoker    Types: Cigarettes  . Smokeless tobacco: Never Used  . Tobacco comment: quit over 30 years ago  Substance Use Topics  . Alcohol use:  Yes    Alcohol/week: 0.0 standard drinks    Comment: rare- wine   . Drug use: No       Medications: Outpatient Medications Prior to Visit  Medication Sig  . hydrocortisone 2.5 % lotion Apply topically as needed.   [name] Kitchen ibuprofen (ADVIL,MOTRIN) 200 MG tablet Take 200 mg by mouth every 6 (six) hours as needed.  . loratadine (CLARITIN) 10 MG tablet Take 1 tablet by mouth daily as needed.   . Multiple Vitamins-Minerals (EQ COMPLETE MULTIVITAMIN-ADULT PO) Take by mouth.  . naproxen (NAPROSYN) 500 MG tablet Take 1 tablet (500 mg total) by mouth 2 (two) times daily with a meal. As needed for back and leg pain  . PARoxetine (PAXIL) 40 MG tablet Take 1 tablet by mouth once daily  . pravastatin (PRAVACHOL) 40 MG tablet Take 1 tablet by mouth once daily  . sildenafil (VIAGRA) 100 MG tablet Take 1 tablet by mouth daily as needed.  . SUMAtriptan (IMITREX) 100 MG tablet Take 100 mg by mouth every 2 (two) hours as needed for migraine. May repeat in 2 hours if headache persists or recurs.  . hydrochlorothiazide (HYDRODIURIL) 25 MG tablet Take 25 mg by mouth daily.   No facility-administered medications prior to visit.    Review of Systems  Constitutional: Negative.   Respiratory: Negative.   Cardiovascular: Negative.   Psychiatric/Behavioral: Positive for confusion and decreased concentration. Negative for behavioral problems, sleep disturbance and suicidal ideas. The patient is nervous/anxious.       Objective  BP 136/86 (BP Location: Right Arm, Patient Position: Sitting, Cuff Size: Large)   Pulse 70   Temp (!) 96.2 F (35.7 C) (Temporal)   Wt 202 lb 3.2 oz (91.7 kg)   BMI 33.65 kg/m    Physical Exam Vitals reviewed.  Constitutional:      Appearance: Normal appearance.  HENT:     Head: Normocephalic and atraumatic.  Cardiovascular:     Rate and Rhythm: Normal rate and regular rhythm.     Pulses: Normal pulses.     Heart sounds: Normal heart sounds.  Pulmonary:     Effort:  Pulmonary effort is normal.     Breath sounds: Normal breath sounds.  Abdominal:     Palpations: Abdomen is soft.  Musculoskeletal:     Cervical back: Normal range of motion and neck supple.     Right lower leg: No edema.     Left lower leg: No edema.  Lymphadenopathy:     Cervical: No cervical adenopathy.  Skin:    General: Skin is warm and dry.  Neurological:     Mental Status: He is alert and oriented to person, place, and time.  Psychiatric:        Mood and Affect: Mood is anxious.        Speech: Speech normal.        Behavior: Behavior normal.      No results found for any visits on 02/04/20.  Assessment & Plan     Problem List Items Addressed This Visit      Nervous and Auditory   Confusion    Seems to be having anticholinergic side effects from paxil and benadryl Discontinue these medications If confusion persists consider dementia work up         Other   Depressive disorder - Primary    Uncontrolled  May be having anticholinergic side effects from Paxil Stop Paxil Start Zoloft at 100 mg daily, consider increasing to 150 mg Contracted for safety  Follow up in 1 month and repeat PHQ-9        Relevant Medications   sertraline (ZOLOFT) 100 MG tablet   busPIRone (BUSPAR) 5 MG tablet   GAD (generalized anxiety disorder)    Uncontrolled  Seems to be having anticholinergic side effects from paxil  Stop paxil and benadryl Start Zoloft at 100 mg, consider increasing to 150 mg in the future Follow up in 1 month and repeat GAD-7       Relevant Medications   sertraline (ZOLOFT) 100 MG tablet   busPIRone (BUSPAR) 5 MG tablet     Return in about 1 month (around 03/06/2020).      I, Lavon Paganini, MD, have reviewed all documentation for this visit. The documentation on 02/04/20 for the exam, diagnosis, procedures, and orders are all accurate and complete.   Gresia Isidoro, Dionne Bucy, MD, MPH [facility]

## 2020-02-04 NOTE — Assessment & Plan Note (Signed)
Uncontrolled  Seems to be having anticholinergic side effects from paxil  Stop paxil and benadryl Start Zoloft at 100 mg, consider increasing to 150 mg in the future Follow up in 1 month and repeat GAD-7

## 2020-02-04 NOTE — Telephone Encounter (Signed)
Patient is requesting an appointment to be seen for worsening anxiety. Dr. Caryn Section is out of the office until Friday and there are no available appointments with Dr. Caryn Section until 02/18/2020 (which is a same day slot). Patient doesn't want to wait this long to be seen. I called and spoke with patient. He says that he needs to talk to someone about his anxiety. 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 Patient states he felt anxious and confused during that event. Patient says he needs to talk to someone about his anxiety because he feels that is getting worse. Appointment scheduled today with Dr. B at 1:40pm.

## 2020-02-04 NOTE — Telephone Encounter (Signed)
Copied from [place] [id number]. Topic: General - Inquiry >> Feb 04, 2020  9:17 AM Cameron King wrote: Reason for CRM: Patient states he needs appt due to recent anxiety attacks.  Patient does not want to wait till the 26th to see PCP.  Patient is wondering if it is anxiety or his dementia.  I asked patient how he is feeling today, and patient states he is not feeling that well.  Patient is having some memory lost also, patient has had this for awhile now.    Call back [phone]

## 2020-03-03 IMAGING — MR MR BRAIN/IAC WO/W
10 of 13 series · 27 of 48 positions shown · IV contrast (gadavist)
Comparison: Head CT 11/05/2005

CLINICAL DATA: Asymmetric right sensorineural hearing loss

EXAM:
MRI HEAD WITHOUT AND WITH CONTRAST
TECHNIQUE: Multiplanar, multiecho pulse sequences of the brain and surrounding
structures were obtained without and with intravenous contrast.
CONTRAST:  9 cc Gadavist intravenous

[Series 5: T1 · sagittal · 5.0mm · 0.62mm/px · 3 of 25 slices shown (1 of 3)]
[im 1/25]
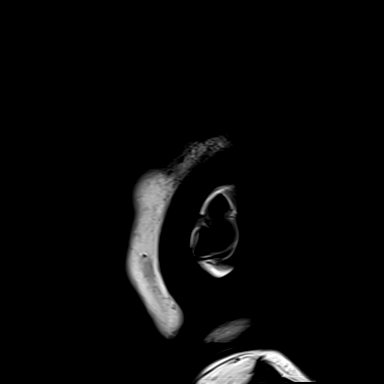
[im 13/25]
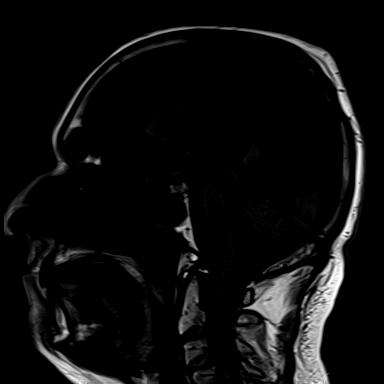
[im 25/25]
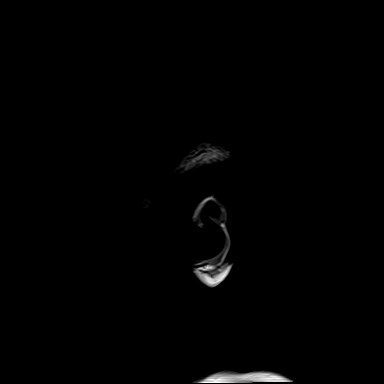

[Series 6: ax dwi_tracew · axial · 3.0mm · 0.73mm/px · z∈[-24,+136]mm · 4 of 55 slices shown]
[im 1/55]
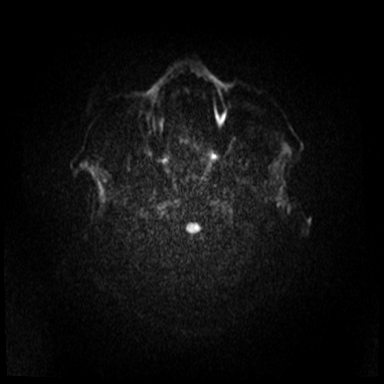
[im 19/55]
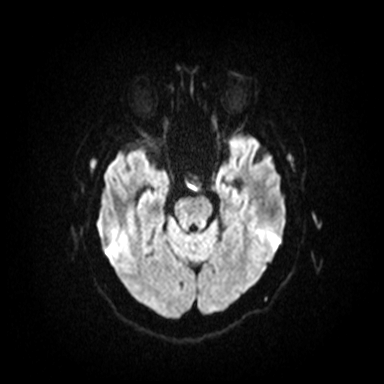
[im 37/55]
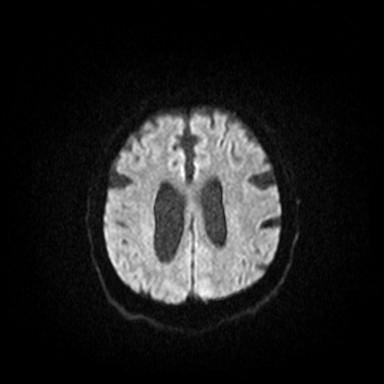
[im 55/55]
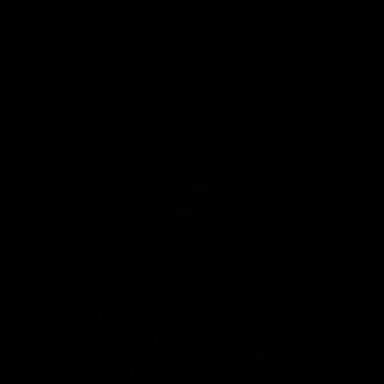

[Series 7: ax dwi_adc · axial · 3.0mm · 0.73mm/px · 1 of 53 slices shown]
[im 1/53]
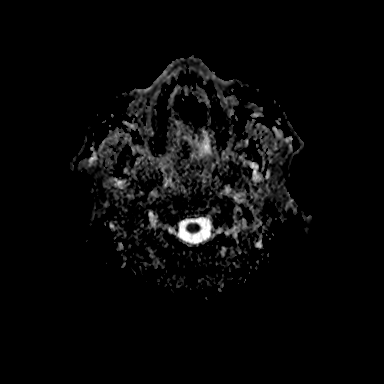

[Series 8: T2 · axial · 5.0mm · 0.53mm/px · z∈[-26,+128]mm · 2 of 27 slices shown]
[im 1/27]
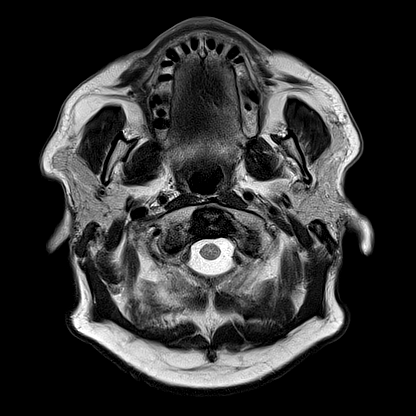
[im 27/27]
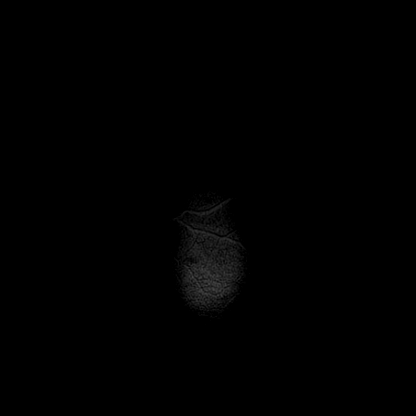

[Series 11: FLAIR · axial · 3.0mm · 0.53mm/px · z∈[-29,+131]mm · 4 of 55 slices shown]
[im 1/55]
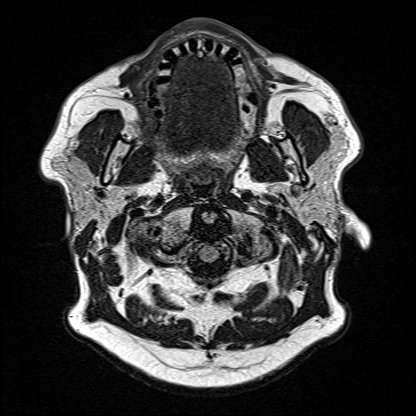
[im 19/55]
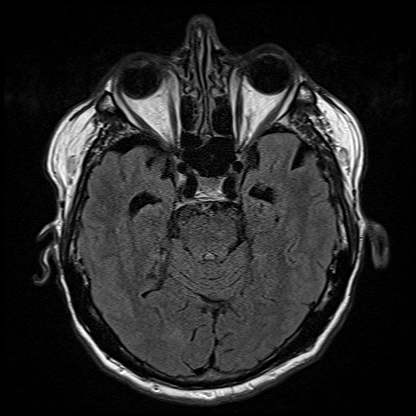
[im 37/55]
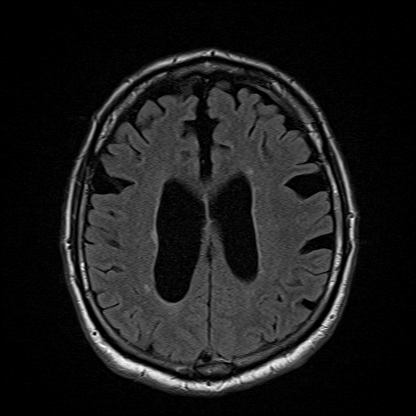
[im 55/55]
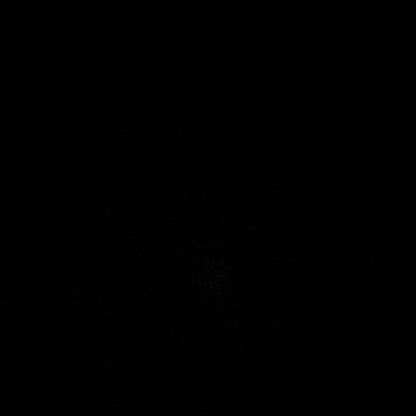

[Series 13: T1 · axial · non-contrast · 3.0mm · 0.21mm/px · 1 of 15 slices shown (2 of 3)]
[im 1/15]
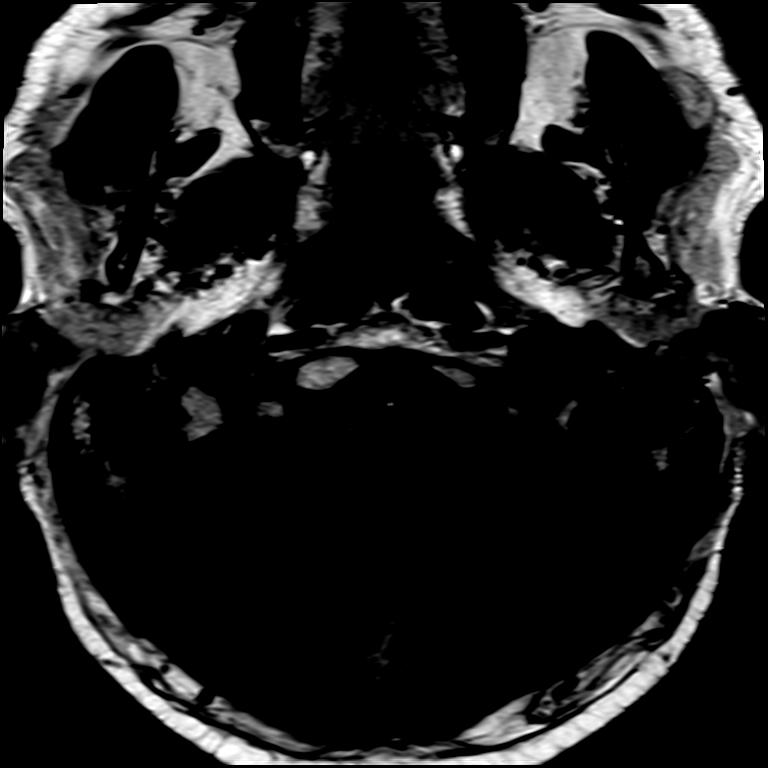

[Series 14: T1 · coronal · non-contrast · 3.0mm · 0.21mm/px · 1 of 13 slices shown (3 of 3)]
[im 1/13]
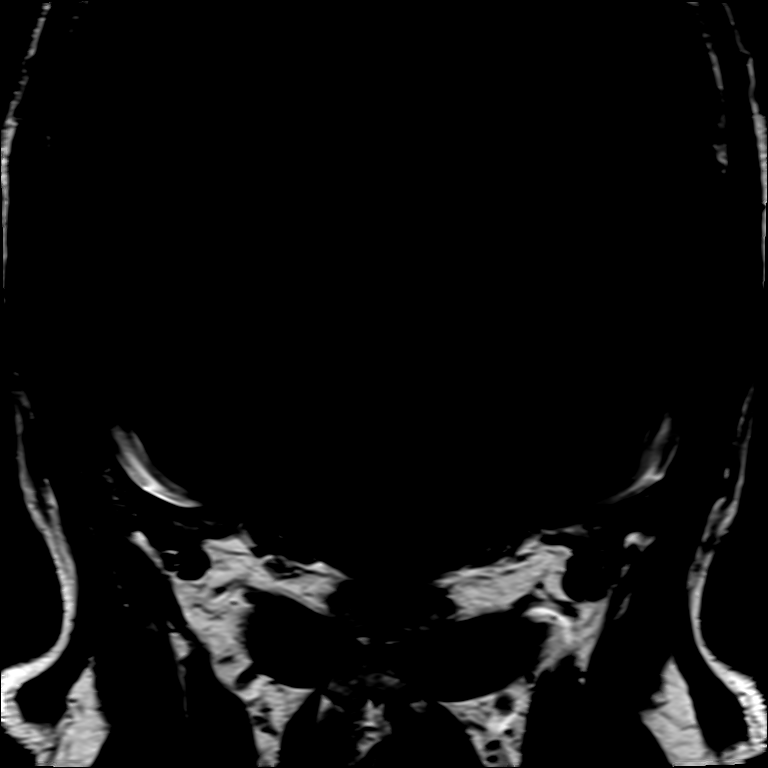

[Series 15: T1 post-contrast · axial · 3.0mm · 0.21mm/px · 1 of 15 slices shown (1 of 3)]
[im 1/15]
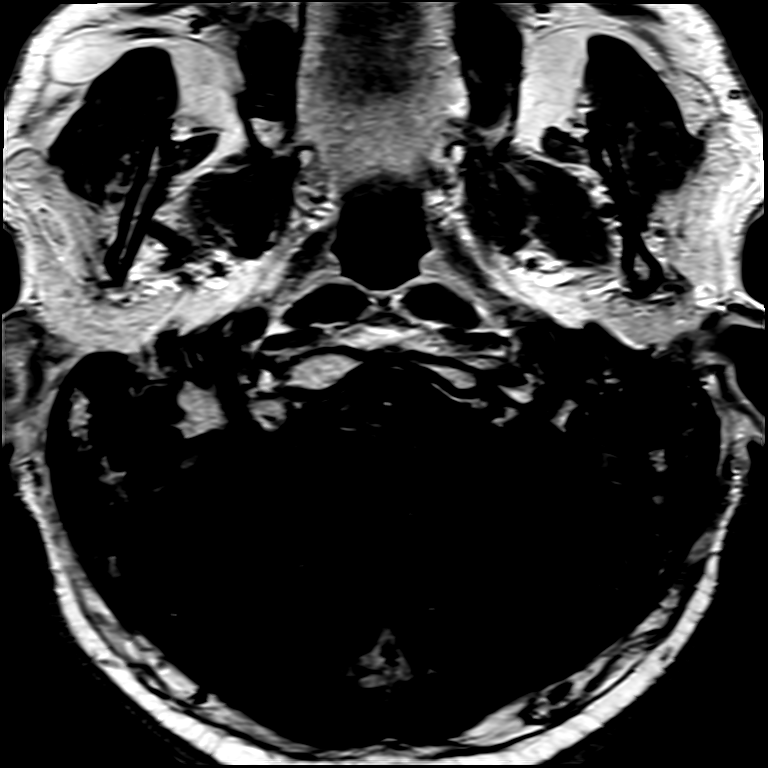

[Series 16: T1 post-contrast · coronal · 3.0mm · 0.21mm/px · 1 of 13 slices shown (2 of 3)]
[im 1/13]
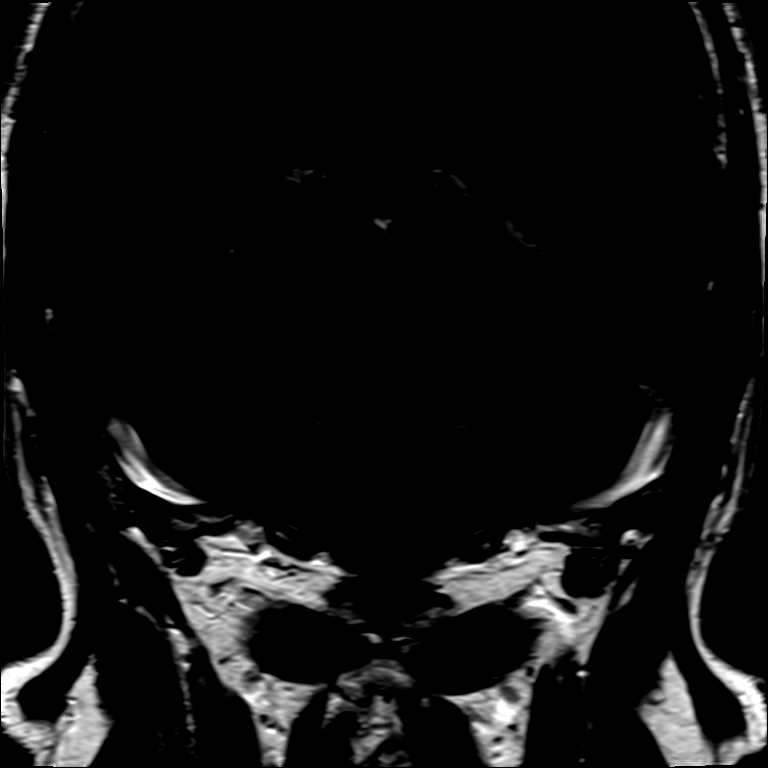

[Series 17: T1 post-contrast · axial · 1.0mm · 0.98mm/px · z∈[-33,+140]mm · 9 of 172 slices shown (3 of 3)]
[im 1/172]
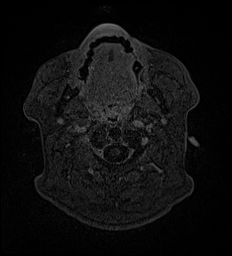
[im 29/172]
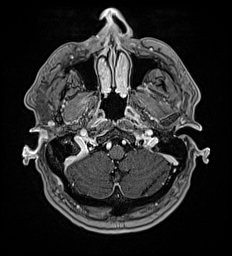
[im 58/172]
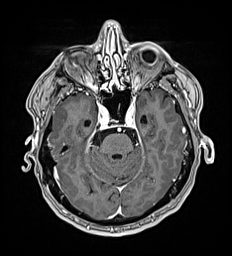
[im 72/172]
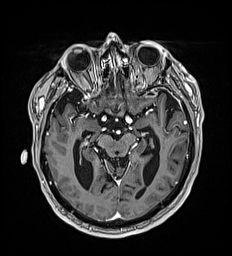
[im 86/172]
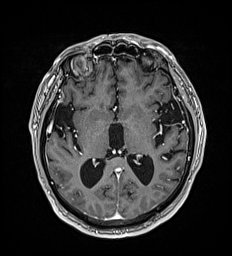
[im 100/172]
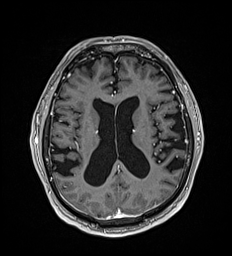
[im 115/172]
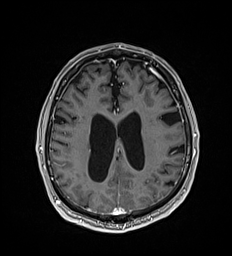
[im 143/172]
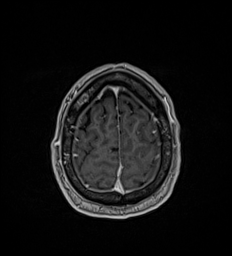
[im 172/172]
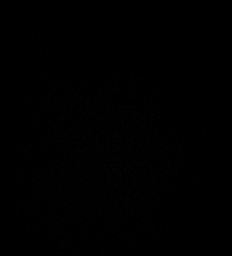

[27 of 48 positions shown; findings below may reference images not displayed]

FINDINGS: Brain: Symmetric normal appearance of the labyrinth,
vestibulocochlear nerves, brainstem, and cisterns.

Cerebral volume loss which may account for ventriculomegaly. There
is also upward bowing of the corpus callosum and sulcal crowding at
the vertex with sylvian fissure widening. Coronal T2 images were not
acquired with this IAC protocol, cannot assess callosum angle. Mph
is a differential consideration.

No infarct, blood products, obstructive hydrocephalus, or masslike
finding.

Vascular: Major flow voids and vascular enhancements are preserved.

Skull and upper cervical spine: No evidence of marrow lesion

Sinuses/Orbits: Negative
IMPRESSION: 1. No evidence of retrocochlear lesion.
2. Ventriculomegaly, please correlate for symptoms of normal
pressure hydrocephalus.

## 2020-03-09 NOTE — Progress Notes (Signed)
Established patient visit   Patient: Cameron King   DOB: 12/11/1945   74 y.o. Male  MRN: [id number] Visit Date: 03/10/2020  Today's healthcare provider: Lelon Huh, MD   Chief Complaint  Patient presents with  . Depression   Subjective    HPI Depression & Anxiety, Follow-up  He  was last seen for this 1 months ago. Changes made at last visit include discontinue Paxil and benadryl due to confusion, and started Zoloft 100 mg.   He reports good compliance with treatment. He is not having side effects.   He reports good tolerance of treatment. He states the confusion has improved since changing to Zoloft. Current symptoms include: depressed mood He feels he is Improved since last visit.  Depression screen [facility] 2/9 02/04/2020 12/01/2019 09/29/2019  Decreased Interest 3 3 0  Down, Depressed, Hopeless 2 2 0  PHQ - 2 Score 5 5 0  Altered sleeping 3 3 -  Tired, decreased energy 2 1 -  Change in appetite 3 3 -  Feeling bad or failure about yourself  2 2 -  Trouble concentrating 2 2 -  Moving slowly or fidgety/restless 1 0 -  Suicidal thoughts 1 1 -  PHQ-9 Score 19 17 -  Difficult doing work/chores Extremely dIfficult Very difficult -    -----------------------------------------------------------------------------------------    Medications: Outpatient Medications Prior to Visit  Medication Sig  . busPIRone (BUSPAR) 5 MG tablet Take 1 tablet (5 mg total) by mouth 3 (three) times daily.  . hydrocortisone 2.5 % lotion Apply topically as needed.   [name] Kitchen ibuprofen (ADVIL,MOTRIN) 200 MG tablet Take 200 mg by mouth every 6 (six) hours as needed.  . loratadine (CLARITIN) 10 MG tablet Take 1 tablet by mouth daily as needed.   . Multiple Vitamins-Minerals (EQ COMPLETE MULTIVITAMIN-ADULT PO) Take by mouth.  . naproxen (NAPROSYN) 500 MG tablet Take 1 tablet (500 mg total) by mouth 2 (two) times daily with a meal. As needed for back and leg pain  . pravastatin (PRAVACHOL) 40 MG tablet  Take 1 tablet by mouth once daily  . sertraline (ZOLOFT) 100 MG tablet Take 1 tablet (100 mg total) by mouth daily.  . sildenafil (VIAGRA) 100 MG tablet Take 1 tablet by mouth daily as needed.  . SUMAtriptan (IMITREX) 100 MG tablet Take 100 mg by mouth every 2 (two) hours as needed for migraine. May repeat in 2 hours if headache persists or recurs.  . [DISCONTINUED] hydrochlorothiazide (HYDRODIURIL) 25 MG tablet Take 25 mg by mouth daily. (Patient not taking: Reported on 03/10/2020)  . [DISCONTINUED] PARoxetine (PAXIL) 40 MG tablet Take 1 tablet by mouth once daily (Patient not taking: Reported on 03/10/2020)   No facility-administered medications prior to visit.    Review of Systems   Objective    BP 138/80 (BP Location: Right Arm, Cuff Size: Large)   Pulse 72   Temp 97.7 F (36.5 C) (Temporal)   Resp 16   Wt 204 lb (92.5 kg)   SpO2 99% Comment: room air  BMI 33.95 kg/m   Physical Exam  General appearance: Obese male, cooperative and in no acute distress Head: Normocephalic, without obvious abnormality, atraumatic Respiratory: Respirations even and unlabored, normal respiratory rate Extremities: All extremities are intact.  Skin: Skin color, texture, turgor normal. No rashes seen  Psych: Appropriate mood and affect. Neurologic: Mental status: Alert, oriented to person, place, and time, thought content appropriate.       Assessment & Plan  1. Depressive disorder   2. GAD (generalized anxiety disorder)  He feels that change from paroxetine  to sertraline is working well for his mood and confusion is greatly improved. Continue current dose of sertraline and follow up 3 months.        The entirety of the information documented in the History of Present Illness, Review of Systems and Physical Exam were personally obtained by me. Portions of this information were initially documented by the CMA and reviewed by me for thoroughness and accuracy.      Lelon Huh, MD    [facility] [phone] (phone) [phone] (fax)  [place]

## 2020-03-10 ENCOUNTER — Encounter: Payer: Self-pay | Admitting: Family Medicine

## 2020-03-10 ENCOUNTER — Ambulatory Visit (INDEPENDENT_AMBULATORY_CARE_PROVIDER_SITE_OTHER): Payer: PPO | Admitting: Family Medicine

## 2020-03-10 ENCOUNTER — Other Ambulatory Visit: Payer: Self-pay

## 2020-03-10 VITALS — BP 138/80 | HR 72 | Temp 97.7°F | Resp 16 | Wt 204.0 lb

## 2020-03-10 DIAGNOSIS — F411 Generalized anxiety disorder: Secondary | ICD-10-CM | POA: Diagnosis not present

## 2020-03-10 DIAGNOSIS — F329 Major depressive disorder, single episode, unspecified: Secondary | ICD-10-CM | POA: Diagnosis not present

## 2020-03-10 DIAGNOSIS — F32A Depression, unspecified: Secondary | ICD-10-CM

## 2020-03-10 NOTE — Progress Notes (Signed)
[name],acting as a scribe for Lelon Huh, MD.,have documented all relevant documentation on the behalf of Lelon Huh, MD,as directed by  Lelon Huh, MD while in the presence of Lelon Huh, MD. Established patient visit   Patient: Cameron King   DOB: Feb 01, 1946   74 y.o. Male  MRN: [id number] Visit Date: 06/21/2020  Today's healthcare provider: Lelon Huh, MD   Chief Complaint  Patient presents with  . Anxiety   Subjective    HPI Depression, Follow-up  He  was last seen for this 03/10/2020 Changes made at last visit include continuing 100mg  sertraline daily after it had been previously been changed from paroxetine due to episodes of confusion, and continue buspirone.   He reports fair compliance with treatment. He usually only remembers to take the buspirone once or twice a day, but does find that it helps with his nerves and anxiety.  He is not having side effects.   He reports good tolerance of treatment. Current symptoms include: depressed mood, difficulty concentrating and fatigue He feels he is Improved since last visit.  Depression screen [facility] 2/9 03/10/2020 02/04/2020 12/01/2019  Decreased Interest 0 3 3  Down, Depressed, Hopeless 1 2 2   PHQ - 2 Score 1 5 5   Altered sleeping 1 3 3   Tired, decreased energy 0 2 1  Change in appetite 2 3 3   Feeling bad or failure about yourself  1 2 2   Trouble concentrating 2 2 2   Moving slowly or fidgety/restless 2 1 0  Suicidal thoughts 1 1 1   PHQ-9 Score 10 19 17   Difficult doing work/chores Somewhat difficult Extremely dIfficult Very difficult    -----------------------------------------------------------------------------------------   Social History   Tobacco Use  . Smoking status: Former Smoker    Types: Cigarettes  . Smokeless tobacco: Never Used  . Tobacco comment: quit over 30 years ago  Vaping Use  . Vaping Use: Never used  Substance Use Topics  . Alcohol use: Yes    Alcohol/week: 0.0  standard drinks    Comment: rare- wine   . Drug use: No       Medications: Outpatient Medications Prior to Visit  Medication Sig  . busPIRone (BUSPAR) 5 MG tablet Take 1 tablet (5 mg total) by mouth 3 (three) times daily.  . hydrocortisone 2.5 % lotion Apply topically as needed.   [name] Kitchen ibuprofen (ADVIL,MOTRIN) 200 MG tablet Take 200 mg by mouth every 6 (six) hours as needed.  . loratadine (CLARITIN) 10 MG tablet Take 1 tablet by mouth daily as needed.   . Multiple Vitamins-Minerals (EQ COMPLETE MULTIVITAMIN-ADULT PO) Take by mouth.  . naproxen (NAPROSYN) 500 MG tablet Take 1 tablet (500 mg total) by mouth 2 (two) times daily with a meal. As needed for back and leg pain  . pravastatin (PRAVACHOL) 40 MG tablet Take 1 tablet by mouth once daily  . sertraline (ZOLOFT) 100 MG tablet Take 1 tablet (100 mg total) by mouth daily.  . sildenafil (VIAGRA) 100 MG tablet Take 1 tablet by mouth daily as needed.  . SUMAtriptan (IMITREX) 100 MG tablet Take 100 mg by mouth every 2 (two) hours as needed for migraine. May repeat in 2 hours if headache persists or recurs.   No facility-administered medications prior to visit.       Objective    BP 122/82 (BP Location: Left Arm, Patient Position: Sitting, Cuff Size: Large)   Pulse 81   Temp 98.3 F (36.8 C) (Oral)   Ht  5\' 5"  (1.651 m)   Wt 211 lb 3.2 oz (95.8 kg)   BMI 35.15 kg/m    Physical Exam   General appearance: Obese male, cooperative and in no acute distress Head: Normocephalic, without obvious abnormality, atraumatic Respiratory: Respirations even and unlabored, normal respiratory rate Extremities: All extremities are intact.  Skin: Skin color, texture, turgor normal. No rashes seen  Psych: Appropriate mood and affect. Neurologic: Mental status: Alert, oriented to person, place, and time, thought content appropriate.   No results found for any visits on 06/21/20.  Assessment & Plan     1. GAD (generalized anxiety disorder) Is  doing well with buspirone, but does not get in 3 times a day, will change to- busPIRone (BUSPAR) 5 MG tablet; Take 1.5 tablets (7.5 mg total) by mouth 2 (two) times daily.  Dispense: 3 tablet  Will send in 7.5mg  tablets before current prescription runs out in November.   2. Depressive disorder Pretty well controlled with current dose of sertraline. Continue current medications.    3. Need for influenza vaccination  - Flu Vaccine QUAD High Dose(Fluad)   Follow up CPE in March       The entirety of the information documented in the History of Present Illness, Review of Systems and Physical Exam were personally obtained by me. Portions of this information were initially documented by the CMA and reviewed by me for thoroughness and accuracy.      Lelon Huh, MD  [facility] [phone] (phone) [phone] (fax)  [place]

## 2020-04-05 ENCOUNTER — Other Ambulatory Visit: Payer: Self-pay | Admitting: Family Medicine

## 2020-04-05 DIAGNOSIS — F411 Generalized anxiety disorder: Secondary | ICD-10-CM

## 2020-04-05 DIAGNOSIS — F32A Depression, unspecified: Secondary | ICD-10-CM

## 2020-04-26 ENCOUNTER — Other Ambulatory Visit: Payer: Self-pay | Admitting: Family Medicine

## 2020-04-26 DIAGNOSIS — F32A Depression, unspecified: Secondary | ICD-10-CM

## 2020-04-26 DIAGNOSIS — F329 Major depressive disorder, single episode, unspecified: Secondary | ICD-10-CM

## 2020-04-26 DIAGNOSIS — F411 Generalized anxiety disorder: Secondary | ICD-10-CM

## 2020-04-26 MED ORDER — SERTRALINE HCL 100 MG PO TABS: 100.0000 mg | ORAL_TABLET | Freq: Every day | ORAL | 0 refills | Status: DC

## 2020-04-26 MED ORDER — BUSPIRONE HCL 5 MG PO TABS: 5.0000 mg | ORAL_TABLET | Freq: Three times a day (TID) | ORAL | 0 refills | Status: DC

## 2020-04-26 NOTE — Telephone Encounter (Signed)
Pt request refill  sertraline (ZOLOFT) 100 MG tablet busPIRone (BUSPAR) 5 MG tablet  [place] (N), [place] [address] Phone:  [phone]  Fax:  [phone]     Pt knows this is a few days early, but he doesn't want to run out.

## 2020-04-26 NOTE — Telephone Encounter (Signed)
Requested Prescriptions  Pending Prescriptions Disp Refills  . sertraline (ZOLOFT) 100 MG tablet 90 tablet 0    Sig: Take 1 tablet (100 mg total) by mouth daily.     Psychiatry:  Antidepressants - SSRI Passed - 04/26/2020  9:31 AM      Passed - Completed PHQ-2 or PHQ-9 in the last 360 days.      Passed - Valid encounter within last 6 months    Recent Outpatient Visits          1 month ago Depressive disorder   [facility] Birdie Sons, MD   2 months ago Depressive disorder   [facility] [place], Dionne Bucy, MD   4 months ago Annual physical exam   [facility] Birdie Sons, MD   1 year ago Annual physical exam   [facility] Birdie Sons, MD   1 year ago Transient elevated blood pressure   [place], [place]      Future Appointments            In 1 month Fisher, Kirstie Peri, MD [facility]           . busPIRone (BUSPAR) 5 MG tablet 270 tablet 0    Sig: Take 1 tablet (5 mg total) by mouth 3 (three) times daily.     Psychiatry: Anxiolytics/Hypnotics - Non-controlled Passed - 04/26/2020  9:31 AM      Passed - Valid encounter within last 6 months    Recent Outpatient Visits          1 month ago Depressive disorder   [facility] Birdie Sons, MD   2 months ago Depressive disorder   [facility] [place], Dionne Bucy, MD   4 months ago Annual physical exam   [facility] Birdie Sons, MD   1 year ago Annual physical exam   [facility] Birdie Sons, MD   1 year ago Transient elevated blood pressure   [facility] Birdie Sons, MD      Future Appointments            In 1 month Fisher, Kirstie Peri, MD [facility], [place]

## 2020-06-21 ENCOUNTER — Encounter: Payer: Self-pay | Admitting: Family Medicine

## 2020-06-21 ENCOUNTER — Ambulatory Visit (INDEPENDENT_AMBULATORY_CARE_PROVIDER_SITE_OTHER): Payer: PPO | Admitting: Family Medicine

## 2020-06-21 ENCOUNTER — Other Ambulatory Visit: Payer: Self-pay

## 2020-06-21 VITALS — BP 122/82 | HR 81 | Temp 98.3°F | Ht 65.0 in | Wt 211.2 lb

## 2020-06-21 DIAGNOSIS — F329 Major depressive disorder, single episode, unspecified: Secondary | ICD-10-CM

## 2020-06-21 DIAGNOSIS — Z23 Encounter for immunization: Secondary | ICD-10-CM | POA: Diagnosis not present

## 2020-06-21 DIAGNOSIS — F411 Generalized anxiety disorder: Secondary | ICD-10-CM | POA: Diagnosis not present

## 2020-06-21 DIAGNOSIS — F32A Depression, unspecified: Secondary | ICD-10-CM

## 2020-06-21 MED ORDER — BUSPIRONE HCL 5 MG PO TABS: 7.5000 mg | ORAL_TABLET | Freq: Two times a day (BID) | ORAL | Status: DC

## 2020-06-21 NOTE — Patient Instructions (Addendum)
•   Increase buspirone to 7.5mg  (1 1/2 tablets) two times daily. I'll change your next prescription to 7.5mg  tablets, so you will only need to take 1 tablet twice a day after.    It is recommended to engage in 150 minutes of moderate exercise every week.

## 2020-07-22 ENCOUNTER — Other Ambulatory Visit: Payer: Self-pay | Admitting: Family Medicine

## 2020-07-22 DIAGNOSIS — F32A Depression, unspecified: Secondary | ICD-10-CM

## 2020-07-22 DIAGNOSIS — F411 Generalized anxiety disorder: Secondary | ICD-10-CM

## 2020-07-22 DIAGNOSIS — F329 Major depressive disorder, single episode, unspecified: Secondary | ICD-10-CM

## 2020-07-22 MED ORDER — BUSPIRONE HCL 7.5 MG PO TABS: 7.5000 mg | ORAL_TABLET | Freq: Two times a day (BID) | ORAL | 1 refills | Status: DC

## 2020-07-27 ENCOUNTER — Other Ambulatory Visit: Payer: Self-pay | Admitting: Family Medicine

## 2020-07-27 NOTE — Telephone Encounter (Signed)
Requested Prescriptions  Pending Prescriptions Disp Refills  . pravastatin (PRAVACHOL) 40 MG tablet [Pharmacy Med Name: Pravastatin Sodium 40 MG Oral Tablet] 90 tablet 1    Sig: Take 1 tablet by mouth once daily     Cardiovascular:  Antilipid - Statins Failed - 07/27/2020  8:20 PM      Failed - LDL in normal range and within 360 days    LDL Chol Calc (NIH)  Date Value Ref Range Status  12/01/2019 92 0 - 99 mg/dL Final         Passed - Total Cholesterol in normal range and within 360 days    Cholesterol, Total  Date Value Ref Range Status  12/01/2019 155 100 - 199 mg/dL Final         Passed - HDL in normal range and within 360 days    HDL  Date Value Ref Range Status  12/01/2019 44 >39 mg/dL Final         Passed - Triglycerides in normal range and within 360 days    Triglycerides  Date Value Ref Range Status  12/01/2019 106 0 - 149 mg/dL Final         Passed - Patient is not pregnant      Passed - Valid encounter within last 12 months    Recent Outpatient Visits          1 month ago GAD (generalized anxiety disorder)   [facility] Birdie Sons, MD   4 months ago Depressive disorder   [facility] Birdie Sons, MD   5 months ago Depressive disorder   [place], Dionne Bucy, MD   7 months ago Annual physical exam   [facility] Birdie Sons, MD   1 year ago Annual physical exam   [facility] Birdie Sons, MD      Future Appointments            In 4 months Fisher, Kirstie Peri, MD [facility], [place]

## 2020-08-09 ENCOUNTER — Other Ambulatory Visit: Payer: Self-pay | Admitting: Family Medicine

## 2020-08-09 DIAGNOSIS — F411 Generalized anxiety disorder: Secondary | ICD-10-CM

## 2020-08-09 DIAGNOSIS — F329 Major depressive disorder, single episode, unspecified: Secondary | ICD-10-CM

## 2020-08-09 DIAGNOSIS — F32A Depression, unspecified: Secondary | ICD-10-CM

## 2020-09-27 ENCOUNTER — Telehealth: Payer: Self-pay | Admitting: Family Medicine

## 2020-09-27 NOTE — Telephone Encounter (Signed)
Copied from CRM [id number]. Topic: Medicare AWV >> Sep 27, 2020  3:31 PM Geraldine Contras wrote: Reason for CRM:  09/27/20 - Left message to let patient know that AWVS visit will not be in the office. It will be by phone.

## 2020-09-29 NOTE — Progress Notes (Unsigned)
Subjective:   Cameron King is a 75 y.o. male who presents for Medicare Annual/Subsequent preventive examination.  I connected with Cameron King today by telephone and verified that I am speaking with the correct person using two identifiers. Location patient: home Location provider: work Persons participating in the virtual visit: patient, provider.   I discussed the limitations, risks, security and privacy concerns of performing an evaluation and management service by telephone and the availability of in person appointments. I also discussed with the patient that there may be a patient responsible charge related to this service. The patient expressed understanding and verbally consented to this telephonic visit.    Interactive audio and video telecommunications were attempted between this provider and patient, however failed, due to patient having technical difficulties OR patient did not have access to video capability.  We continued and completed visit with audio only.   Review of Systems    N/A   Cardiac Risk Factors include: advanced age (>56men, >82 women);male gender;dyslipidemia     Objective:    Today's Vitals   09/30/20 1018  PainSc: 6    There is no height or weight on file to calculate BMI.  Advanced Directives 09/30/2020 09/29/2019 10/29/2018 09/26/2018 09/21/2017 09/21/2016  Does Patient Have a Medical Advance Directive? No No No No No No  Would patient like information on creating a medical advance directive? No - Patient declined No - Patient declined No - Patient declined No - Patient declined Yes (MAU/Ambulatory/Procedural Areas - Information given) No - Patient declined    Current Medications (verified) Outpatient Encounter Medications as of 09/30/2020  Medication Sig  . busPIRone (BUSPAR) 7.5 MG tablet Take 1 tablet (7.5 mg total) by mouth 2 (two) times daily.  . hydrocortisone 2.5 % lotion Apply topically as needed.   . loratadine (CLARITIN) 10 MG tablet Take 1  tablet by mouth daily as needed.   . Multiple Vitamins-Minerals (EQ COMPLETE MULTIVITAMIN-ADULT PO) Take by mouth daily at 6 (six) AM.  . naproxen (NAPROSYN) 500 MG tablet Take 1 tablet (500 mg total) by mouth 2 (two) times daily with a meal. As needed for back and leg pain  . pravastatin (PRAVACHOL) 40 MG tablet Take 1 tablet by mouth once daily  . sertraline (ZOLOFT) 100 MG tablet Take 1 tablet by mouth once daily  . sildenafil (VIAGRA) 100 MG tablet Take 1 tablet by mouth daily as needed.  . SUMAtriptan (IMITREX) 100 MG tablet Take 100 mg by mouth every 2 (two) hours as needed for migraine. May repeat in 2 hours if headache persists or recurs.  [name] Kitchen ibuprofen (ADVIL,MOTRIN) 200 MG tablet Take 200 mg by mouth every 6 (six) hours as needed. (Patient not taking: Reported on 09/30/2020)   No facility-administered encounter medications on file as of 09/30/2020.    Allergies (verified) Prednisone   History: Past Medical History:  Diagnosis Date  . Depression   . High cholesterol   . Hypertension   . Meniere disease   . Ocular Migraines   . Shingles 07/16/2015   of eye    Past Surgical History:  Procedure Laterality Date  . COLONOSCOPY    . COLONOSCOPY WITH PROPOFOL N/A 10/29/2018   Procedure: COLONOSCOPY WITH PROPOFOL;  Surgeon: Lucilla Lame, MD;  Location[facility] ENDOSCOPY;  Service: Endoscopy;  Laterality: N/A;  . SIGMOIDOSCOPY  2008   Dr. Jamal Collin. Normal per patient report   Family History  Adopted: Yes  Problem Relation Age of Onset  . Cancer - Other Father   .  Stroke Son    Social History   Socioeconomic History  . Marital status: Widowed    Spouse name: Not on file  . Number of children: 2  . Years of education: Not on file  . Highest education level: Associate degree: occupational, Scientist, product/process development, or vocational program  Occupational History  . Occupation: Retired    Comment: Retired 2009  Tobacco Use  . Smoking status: Former Smoker    Types: Cigarettes  . Smokeless tobacco:  Never Used  . Tobacco comment: quit over 30 years ago  Vaping Use  . Vaping Use: Never used  Substance and Sexual Activity  . Alcohol use: Yes    Alcohol/week: 0.0 standard drinks    Comment: rare wine  . Drug use: No  . Sexual activity: Not on file  Other Topics Concern  . Not on file  Social History Narrative   Pt currently has a girlfriend who has had a heart attack recently. This has added to his stress level.    Social Determinants of Health   Financial Resource Strain: Low Risk   . Difficulty of Paying Living Expenses: Not hard at all  Food Insecurity: No Food Insecurity  . Worried About Programme researcher, broadcasting/film/video in the Last Year: Never true  . Ran Out of Food in the Last Year: Never true  Transportation Needs: No Transportation Needs  . Lack of Transportation (Medical): No  . Lack of Transportation (Non-Medical): No  Physical Activity: Inactive  . Days of Exercise per Week: 0 days  . Minutes of Exercise per Session: 0 min  Stress: Stress Concern Present  . Feeling of Stress : Rather much  Social Connections: Moderately Integrated  . Frequency of Communication with Friends and Family: Three times a week  . Frequency of Social Gatherings with Friends and Family: More than three times a week  . Attends Religious Services: More than 4 times per year  . Active Member of Clubs or Organizations: No  . Attends Banker Meetings: Never  . Marital Status: Living with partner    Tobacco Counseling Counseling given: Not Answered Comment: quit over 30 years ago   Clinical Intake:  Pre-visit preparation completed: Yes  Pain : 0-10 Pain Score: 6  Pain Type: Acute pain Pain Location: Leg (thigh) Pain Orientation: Left Pain Descriptors / Indicators: Aching Pain Frequency: Intermittent Pain Relieving Factors: Taking Naproxen as needed for pain.  Pain Relieving Factors: Taking Naproxen as needed for pain.  Nutritional Risks: None Diabetes: No  How often do you  need to have someone help you when you read instructions, pamphlets, or other written materials from your doctor or pharmacy?: 1 - Never  Diabetic? No  Interpreter Needed?: No  Information entered by :: [facility], LPN   Activities of Daily Living In your present state of health, do you have any difficulty performing the following activities: 09/30/2020 06/21/2020  Hearing? N Y  Vision? N Y  Difficulty concentrating or making decisions? N Y  Walking or climbing stairs? Y Y  Comment Due to right knee pain. -  Dressing or bathing? N N  Doing errands, shopping? N Y  Quarry manager and eating ? N -  Using the Toilet? N -  In the past six months, have you accidently leaked urine? Y -  Comment Occasionally with urges. -  Do you have problems with loss of bowel control? N -  Managing your Medications? N -  Managing your Finances? N -  Housekeeping or managing your  Housekeeping? N -  Some recent data might be hidden    Patient Care Team: Birdie Sons, MD as PCP - General (Family Medicine) Lucilla Lame, MD as Consulting Physician (Gastroenterology)  Indicate any recent Medical Services you may have received from other than [facility] providers in the past year (date may be approximate).     Assessment:   This is a routine wellness examination for Cameron King.  Hearing/Vision screen No exam data present  Dietary issues and exercise activities discussed: Current Exercise Habits: The patient does not participate in regular exercise at present, Exercise limited by: orthopedic condition(s)  Goals    . DIET - REDUCE PORTION SIZE     Recommend cutting portion sizes in half and eating 3 small meals a day with 2 healthy snacks in between.     . Prevent falls     Recommend to remove any items from the home that may cause slips or trips.      Depression Screen PHQ 2/9 Scores 09/30/2020 06/21/2020 03/10/2020 02/04/2020 12/01/2019 09/29/2019 09/29/2019  PHQ - 2 Score 2 3 1 5 5  0 0  PHQ- 9 Score 8 15 10  19 17  - -    Fall Risk Fall Risk  09/30/2020 06/21/2020 09/29/2019 09/26/2018 09/21/2017  Falls in the past year? 1 1 1 1  Yes  Number falls in past yr: 1 0 1 1 2  or more  Comment - - - - -  Injury with Fall? 0 0 0 0 (No Data)  Comment - - - - muscle strain  Risk for fall due to : Impaired balance/gait;Orthopedic patient;Impaired mobility - Impaired balance/gait - -  Follow up Falls prevention discussed Falls evaluation completed Falls prevention discussed Falls prevention discussed Falls prevention discussed    FALL RISK PREVENTION PERTAINING TO THE HOME:  Any stairs in or around the home? Yes  If so, are there any without handrails? No  Home free of loose throw rugs in walkways, pet beds, electrical cords, etc? Yes  Adequate lighting in your home to reduce risk of falls? Yes   ASSISTIVE DEVICES UTILIZED TO PREVENT FALLS:  Life alert? No  Use of a cane, walker or w/c? Yes  Grab bars in the bathroom? Yes  Shower chair or bench in shower? Yes  Elevated toilet seat or a handicapped toilet? No    Cognitive Function: Normal cognitive status assessed by observation by this Nurse Health Advisor. No abnormalities found.          Immunizations Immunization History  Administered Date(s) Administered  . Fluad Quad(high Dose 65+) 06/21/2020  . Influenza, High Dose Seasonal PF 07/19/2015, 08/03/2016, 06/28/2017, 06/12/2018  . Influenza,inj,Quad PF,6+ Mos 07/28/2019  . PFIZER SARS-COV-2 Vaccination 11/07/2019, 12/03/2019, 08/09/2020  . Pneumococcal Conjugate-13 08/24/2014  . Pneumococcal Polysaccharide-23 05/01/2012  . Td 09/26/2003  . Tdap 07/19/2015    TDAP status: Up to date  Flu Vaccine status: Up to date  Pneumococcal vaccine status: Up to date  Covid-19 vaccine status: Completed vaccines  Qualifies for Shingles Vaccine? Yes   Zostavax completed No   Shingrix Completed?: No.    Education has been provided regarding the importance of this vaccine. Patient has been advised to  call insurance company to determine out of pocket expense if they have not yet received this vaccine. Advised may also receive vaccine at local pharmacy or Health Dept. Verbalized acceptance and understanding.  Screening Tests Health Maintenance  Topic Date Due  . COLONOSCOPY (Pts 45-56yrs Insurance coverage will need to be confirmed)  10/30/2023  . TETANUS/TDAP  07/18/2025  . INFLUENZA VACCINE  Completed  . COVID-19 Vaccine  Completed  . Hepatitis C Screening  Completed  . PNA vac Low Risk Adult  Completed    Health Maintenance  There are no preventive care reminders to display for this patient.  Colorectal cancer screening: Type of screening: Colonoscopy. Completed 10/29/18. Repeat every 5 years  Lung Cancer Screening: (Low Dose CT Chest recommended if Age 19-80 years, 30 pack-year currently smoking OR have quit w/in 15years.) does not qualify.   Additional Screening:  Hepatitis C Screening: Up to date  Vision Screening: Recommended annual ophthalmology exams for early detection of glaucoma and other disorders of the eye. Is the patient up to date with their annual eye exam? No Who is the provider or what is the name of the office in which the patient attends annual eye exams? n/a If pt is not established with a provider, would they like to be referred to a provider to establish care? No .   Dental Screening: Recommended annual dental exams for proper oral hygiene  Community Resource Referral / Chronic Care Management: CRR required this visit?  No   CCM required this visit?  No      Plan:     I have personally reviewed and noted the following in the patient's chart:   . Medical and social history . Use of alcohol, tobacco or illicit drugs  . Current medications and supplements . Functional ability and status . Nutritional status . Physical activity . Advanced directives . List of other physicians . Hospitalizations, surgeries, and ER visits in previous 12  months . Vitals . Screenings to include cognitive, depression, and falls . Referrals and appointments  In addition, I have reviewed and discussed with patient certain preventive protocols, quality metrics, and best practice recommendations. A written personalized care plan for preventive services as well as general preventive health recommendations were provided to patient.     Averianna Brugger [place], [place]   [id number]   Nurse Notes: None.

## 2020-09-30 ENCOUNTER — Ambulatory Visit (INDEPENDENT_AMBULATORY_CARE_PROVIDER_SITE_OTHER): Payer: PPO

## 2020-09-30 ENCOUNTER — Other Ambulatory Visit: Payer: Self-pay

## 2020-09-30 DIAGNOSIS — Z0000 Encounter for general adult medical examination without abnormal findings: Secondary | ICD-10-CM

## 2020-09-30 NOTE — Patient Instructions (Signed)
Cameron King , Thank you for taking time to come for your Medicare Wellness Visit. I appreciate your ongoing commitment to your health goals. Please review the following plan we discussed and let me know if I can assist you in the future.   Screening recommendations/referrals: Colonoscopy: Up to date, due 10/2023 Recommended yearly ophthalmology/optometry visit for glaucoma screening and checkup Recommended yearly dental visit for hygiene and checkup  Vaccinations: Influenza vaccine: Done 06/21/20 Pneumococcal vaccine: Completed series Tdap vaccine: Up to date, due 06/2025 Shingles vaccine: Shingrix discussed. Please contact your pharmacy for coverage information.     Advanced directives: Advance directive discussed with you today. I have provided a copy for you to complete at home and have notarized. Once this is complete please bring a copy in to our office so we can scan it into your chart.  Conditions/risks identified: Fall risk preventatives discussed today. Recommend cutting portion sizes in half and eating 3 small meals a day with 2 healthy snacks in between.   Next appointment: 12/20/20 @ 9:00 AM with Dr Sherrie Mustache   Preventive Care 65 Years and Older, Male Preventive care refers to lifestyle choices and visits with your health care provider that can promote health and wellness. What does preventive care include?  A yearly physical exam. This is also called an annual well check.  Dental exams once or twice a year.  Routine eye exams. Ask your health care provider how often you should have your eyes checked.  Personal lifestyle choices, including:  Daily care of your teeth and gums.  Regular physical activity.  Eating a healthy diet.  Avoiding tobacco and drug use.  Limiting alcohol use.  Practicing safe sex.  Taking low doses of aspirin every day.  Taking vitamin and mineral supplements as recommended by your health care provider. What happens during an annual well  check? The services and screenings done by your health care provider during your annual well check will depend on your age, overall health, lifestyle risk factors, and family history of disease. Counseling  Your health care provider may ask you questions about your:  Alcohol use.  Tobacco use.  Drug use.  Emotional well-being.  Home and relationship well-being.  Sexual activity.  Eating habits.  History of falls.  Memory and ability to understand (cognition).  Work and work Astronomer. Screening  You may have the following tests or measurements:  Height, weight, and BMI.  Blood pressure.  Lipid and cholesterol levels. These may be checked every 5 years, or more frequently if you are over 36 years old.  Skin check.  Lung cancer screening. You may have this screening every year starting at age 105 if you have a 30-pack-year history of smoking and currently smoke or have quit within the past 15 years.  Fecal occult blood test (FOBT) of the stool. You may have this test every year starting at age 78.  Flexible sigmoidoscopy or colonoscopy. You may have a sigmoidoscopy every 5 years or a colonoscopy every 10 years starting at age 5.  Prostate cancer screening. Recommendations will vary depending on your family history and other risks.  Hepatitis C blood test.  Hepatitis B blood test.  Sexually transmitted disease (STD) testing.  Diabetes screening. This is done by checking your blood sugar (glucose) after you have not eaten for a while (fasting). You may have this done every 1-3 years.  Abdominal aortic aneurysm (AAA) screening. You may need this if you are a current or former smoker.  Osteoporosis. You  may be screened starting at age 68 if you are at high risk. Talk with your health care provider about your test results, treatment options, and if necessary, the need for more tests. Vaccines  Your health care provider may recommend certain vaccines, such  as:  Influenza vaccine. This is recommended every year.  Tetanus, diphtheria, and acellular pertussis (Tdap, Td) vaccine. You may need a Td booster every 10 years.  Zoster vaccine. You may need this after age 46.  Pneumococcal 13-valent conjugate (PCV13) vaccine. One dose is recommended after age 66.  Pneumococcal polysaccharide (PPSV23) vaccine. One dose is recommended after age 67. Talk to your health care provider about which screenings and vaccines you need and how often you need them. This information is not intended to replace advice given to you by your health care provider. Make sure you discuss any questions you have with your health care provider. Document Released: 10/08/2015 Document Revised: 05/31/2016 Document Reviewed: 07/13/2015 Elsevier Interactive Patient Education  2017 [place] Prevention in the Home Falls can cause injuries. They can happen to people of all ages. There are many things you can do to make your home safe and to help prevent falls. What can I do on the outside of my home?  Regularly fix the edges of walkways and driveways and fix any cracks.  Remove anything that might make you trip as you walk through a door, such as a raised step or threshold.  Trim any bushes or trees on the path to your home.  Use bright outdoor lighting.  Clear any walking paths of anything that might make someone trip, such as rocks or tools.  Regularly check to see if handrails are loose or broken. Make sure that both sides of any steps have handrails.  Any raised decks and porches should have guardrails on the edges.  Have any leaves, snow, or ice cleared regularly.  Use sand or salt on walking paths during winter.  Clean up any spills in your garage right away. This includes oil or grease spills. What can I do in the bathroom?  Use night lights.  Install grab bars by the toilet and in the tub and shower. Do not use towel bars as grab bars.  Use  non-skid mats or decals in the tub or shower.  If you need to sit down in the shower, use a plastic, non-slip stool.  Keep the floor dry. Clean up any water that spills on the floor as soon as it happens.  Remove soap buildup in the tub or shower regularly.  Attach bath mats securely with double-sided non-slip rug tape.  Do not have throw rugs and other things on the floor that can make you trip. What can I do in the bedroom?  Use night lights.  Make sure that you have a light by your bed that is easy to reach.  Do not use any sheets or blankets that are too big for your bed. They should not hang down onto the floor.  Have a firm chair that has side arms. You can use this for support while you get dressed.  Do not have throw rugs and other things on the floor that can make you trip. What can I do in the kitchen?  Clean up any spills right away.  Avoid walking on wet floors.  Keep items that you use a lot in easy-to-reach places.  If you need to reach something above you, use a strong step stool that  has a grab bar.  Keep electrical cords out of the way.  Do not use floor polish or wax that makes floors slippery. If you must use wax, use non-skid floor wax.  Do not have throw rugs and other things on the floor that can make you trip. What can I do with my stairs?  Do not leave any items on the stairs.  Make sure that there are handrails on both sides of the stairs and use them. Fix handrails that are broken or loose. Make sure that handrails are as long as the stairways.  Check any carpeting to make sure that it is firmly attached to the stairs. Fix any carpet that is loose or worn.  Avoid having throw rugs at the top or bottom of the stairs. If you do have throw rugs, attach them to the floor with carpet tape.  Make sure that you have a light switch at the top of the stairs and the bottom of the stairs. If you do not have them, ask someone to add them for you. What  else can I do to help prevent falls?  Wear shoes that:  Do not have high heels.  Have rubber bottoms.  Are comfortable and fit you well.  Are closed at the toe. Do not wear sandals.  If you use a stepladder:  Make sure that it is fully opened. Do not climb a closed stepladder.  Make sure that both sides of the stepladder are locked into place.  Ask someone to hold it for you, if possible.  Clearly mark and make sure that you can see:  Any grab bars or handrails.  First and last steps.  Where the edge of each step is.  Use tools that help you move around (mobility aids) if they are needed. These include:  Canes.  Walkers.  Scooters.  Crutches.  Turn on the lights when you go into a dark area. Replace any light bulbs as soon as they burn out.  Set up your furniture so you have a clear path. Avoid moving your furniture around.  If any of your floors are uneven, fix them.  If there are any pets around you, be aware of where they are.  Review your medicines with your doctor. Some medicines can make you feel dizzy. This can increase your chance of falling. Ask your doctor what other things that you can do to help prevent falls. This information is not intended to replace advice given to you by your health care provider. Make sure you discuss any questions you have with your health care provider. Document Released: 07/08/2009 Document Revised: 02/17/2016 Document Reviewed: 10/16/2014 Elsevier Interactive Patient Education  2017 [employer].

## 2020-10-14 ENCOUNTER — Telehealth: Payer: Self-pay

## 2020-10-14 NOTE — Telephone Encounter (Signed)
Copied from [place] [phone]. Topic: Referral - Request for Referral >> Oct 14, 2020  3:18 PM Alanda Slim E wrote: Has patient seen PCP for this complaint? Yes *If NO, is insurance requiring patient see PCP for this issue before PCP can refer them? Referral for which specialty: Orthopedic  Preferred provider/office:  Reason for referral: Due to lack of support from legs/ please advise

## 2020-10-18 ENCOUNTER — Ambulatory Visit
Admission: RE | Admit: 2020-10-18 | Discharge: 2020-10-18 | Disposition: A | Discharge status: Home / Self Care | Payer: PPO | Source: Ambulatory Visit | Attending: Family Medicine | Admitting: Family Medicine

## 2020-10-18 ENCOUNTER — Other Ambulatory Visit: Payer: Self-pay

## 2020-10-18 ENCOUNTER — Ambulatory Visit (INDEPENDENT_AMBULATORY_CARE_PROVIDER_SITE_OTHER): Payer: PPO | Admitting: Family Medicine

## 2020-10-18 ENCOUNTER — Encounter: Payer: Self-pay | Admitting: Family Medicine

## 2020-10-18 VITALS — BP 138/78 | HR 80 | Temp 97.7°F | Resp 16 | Wt 201.0 lb

## 2020-10-18 DIAGNOSIS — M25572 Pain in left ankle and joints of left foot: Secondary | ICD-10-CM

## 2020-10-18 NOTE — Progress Notes (Signed)
Established patient visit   Patient: Cameron King   DOB: 01-Oct-1945   75 y.o. Male  MRN: [id number] Visit Date: 10/18/2020  Today's healthcare provider: Vernie Murders, PA-C   Chief Complaint  Patient presents with  . Joint Swelling   Subjective    HPI  Ankle swelling.  Patient complains of swelling pain and discoloration of the ankles. Symptoms started a couple of years ago and  Has gradually worsened. He also reports weakness of  the left ankle. He says his ankle gave out on him recently and caused him to fall.    Past Medical History:  Diagnosis Date  . Depression   . High cholesterol   . Hypertension   . Meniere disease   . Ocular Migraines   . Shingles 07/16/2015   of eye    Past Surgical History:  Procedure Laterality Date  . COLONOSCOPY    . COLONOSCOPY WITH PROPOFOL N/A 10/29/2018   Procedure: COLONOSCOPY WITH PROPOFOL;  Surgeon: Lucilla Lame, MD;  Location: [facility] ENDOSCOPY;  Service: Endoscopy;  Laterality: N/A;  . SIGMOIDOSCOPY  2008   Dr. Jamal Collin. Normal per patient report   Social History   Tobacco Use  . Smoking status: Former Smoker    Types: Cigarettes  . Smokeless tobacco: Never Used  . Tobacco comment: quit over 30 years ago  Vaping Use  . Vaping Use: Never used  Substance Use Topics  . Alcohol use: Yes    Alcohol/week: 0.0 standard drinks    Comment: rare wine  . Drug use: No   Family History  Adopted: Yes  Problem Relation Age of Onset  . Cancer - Other Father   . Stroke Son    Allergies  Allergen Reactions  . Prednisone Other (See Comments)    Vomiting     Medications: Outpatient Medications Prior to Visit  Medication Sig  . aspirin EC 81 MG tablet Take 81 mg by mouth daily. Swallow whole.  . busPIRone (BUSPAR) 7.5 MG tablet Take 1 tablet (7.5 mg total) by mouth 2 (two) times daily.  [name] Kitchen docusate sodium (COLACE) 100 MG capsule Take 100 mg by mouth every other day.  . hydrocortisone 2.5 % lotion Apply topically as needed.    [name] Kitchen ibuprofen (ADVIL,MOTRIN) 200 MG tablet Take 200 mg by mouth every 6 (six) hours as needed.  . loratadine (CLARITIN) 10 MG tablet Take 1 tablet by mouth daily as needed.   . Multiple Vitamins-Minerals (EQ COMPLETE MULTIVITAMIN-ADULT PO) Take by mouth daily at 6 (six) AM.  . naproxen (NAPROSYN) 500 MG tablet Take 1 tablet (500 mg total) by mouth 2 (two) times daily with a meal. As needed for back and leg pain  . pravastatin (PRAVACHOL) 40 MG tablet Take 1 tablet by mouth once daily  . sertraline (ZOLOFT) 100 MG tablet Take 1 tablet by mouth once daily  . sildenafil (VIAGRA) 100 MG tablet Take 1 tablet by mouth daily as needed.  . SUMAtriptan (IMITREX) 100 MG tablet Take 100 mg by mouth every 2 (two) hours as needed for migraine. May repeat in 2 hours if headache persists or recurs.   No facility-administered medications prior to visit.    Review of Systems  Constitutional: Negative for appetite change, chills and fever.  Respiratory: Negative for chest tightness, shortness of breath and wheezing.   Cardiovascular: Negative for chest pain and palpitations.  Gastrointestinal: Negative for abdominal pain, nausea and vomiting.  Musculoskeletal: Positive for arthralgias (ankle pain) and joint swelling.  Skin: Positive for color change.       Objective    BP 138/78   Pulse 80   Temp 97.7 F (36.5 C) (Temporal)   Resp 16   Wt 201 lb (91.2 kg)   BMI 33.45 kg/m     Physical Exam Constitutional:      General: He is not in acute distress.    Appearance: He is well-developed and well-nourished.  HENT:     Head: Normocephalic and atraumatic.     Right Ear: Hearing normal.     Left Ear: Hearing normal.     Nose: Nose normal.  Eyes:     General: Lids are normal. No scleral icterus.       Right eye: No discharge.        Left eye: No discharge.     Conjunctiva/sclera: Conjunctivae normal.  Pulmonary:     Effort: Pulmonary effort is normal. No respiratory distress.  Musculoskeletal:         General: Normal range of motion.     Comments: Slight puffiness of the left ankle.   Skin:    General: Skin is intact.     Findings: No lesion or rash.  Neurological:     Mental Status: He is alert and oriented to person, place, and time.  Psychiatric:        Mood and Affect: Mood and affect normal.        Speech: Speech normal.        Behavior: Behavior normal.        Thought Content: Thought content normal.     No results found for any visits on 10/18/20.  Assessment & Plan     1. Acute left ankle pain Recent fall and swelling of the left ankle. Some bruising. Will get x-ray evaluation to rule out fracture. Recheck pending report. - DG Ankle Complete Left   No follow-ups on file.      I, Ronee Ranganathan, PA-C, have reviewed all documentation for this visit. The documentation on 12/29/20 for the exam, diagnosis, procedures, and orders are all accurate and complete.    Vernie Murders, PA-C  [employer] [phone] (phone) [phone] (fax)  [place]

## 2020-10-25 NOTE — Telephone Encounter (Signed)
Not sure if this is a musculoskeletal issue or a neurological one. He needs to bee seen in office for evaluation to determine appropriate referral.

## 2020-10-26 NOTE — Telephone Encounter (Signed)
Tried calling patient. Left message to call back. OK for PEC triage to advise and schedule appointment.  

## 2020-10-27 NOTE — Telephone Encounter (Signed)
Tried calling patient. Left message to call back. OK for PEC triage to advise.  ?

## 2020-11-01 NOTE — Telephone Encounter (Signed)
Patient has appointment scheduled for 11/10/2020 at 8:20am.

## 2020-11-10 ENCOUNTER — Encounter: Payer: Self-pay | Admitting: Family Medicine

## 2020-11-10 ENCOUNTER — Ambulatory Visit (INDEPENDENT_AMBULATORY_CARE_PROVIDER_SITE_OTHER): Payer: PPO | Admitting: Family Medicine

## 2020-11-10 ENCOUNTER — Other Ambulatory Visit: Payer: Self-pay

## 2020-11-10 VITALS — BP 136/84 | HR 78 | Temp 98.3°F | Resp 16 | Wt 196.4 lb

## 2020-11-10 DIAGNOSIS — R200 Anesthesia of skin: Secondary | ICD-10-CM | POA: Diagnosis not present

## 2020-11-10 DIAGNOSIS — E7800 Pure hypercholesterolemia, unspecified: Secondary | ICD-10-CM | POA: Diagnosis not present

## 2020-11-10 DIAGNOSIS — M25572 Pain in left ankle and joints of left foot: Secondary | ICD-10-CM

## 2020-11-10 DIAGNOSIS — G8929 Other chronic pain: Secondary | ICD-10-CM

## 2020-11-10 DIAGNOSIS — R202 Paresthesia of skin: Secondary | ICD-10-CM | POA: Diagnosis not present

## 2020-11-10 NOTE — Patient Instructions (Addendum)
.   Please review the attached list of medications and notify my office if there are any errors.   . Try OTC Voltaren 1% Gel to your ankle two to three times a day

## 2020-11-10 NOTE — Progress Notes (Signed)
Established patient visit   Patient: Cameron King   DOB: Apr 22, 1946   75 y.o. Male  MRN: [id number] Visit Date: 11/10/2020  Today's healthcare provider: Lelon Huh, MD   Chief Complaint  Patient presents with  . Ankle Pain   Subjective    Ankle Pain  Incident onset: 3 weeks. The incident occurred at home. The pain is present in the left ankle. The quality of the pain is described as aching, cramping and shooting. The pain is mild. The pain has been constant since onset. Pertinent negatives include no inability to bear weight, loss of motion, loss of sensation, muscle weakness, numbness or tingling. He reports no foreign bodies present. The symptoms are aggravated by weight bearing. He has tried NSAIDs for the symptoms. The treatment provided mild relief.   Has had swelling and pain for a long period of time but has worsened the last several weeks. Saw dennis 3 weeks and had negative x-rays. He reports remote injury when he fell through a step that he thinks he injured his foot and ankle.   Complains of bilateral feet numbness on bottoms of feet, and tingling for a few months and getting worse. Is worse when up and walking around      Medications: Outpatient Medications Prior to Visit  Medication Sig  . aspirin EC 81 MG tablet Take 81 mg by mouth daily. Swallow whole.  . busPIRone (BUSPAR) 7.5 MG tablet Take 1 tablet (7.5 mg total) by mouth 2 (two) times daily.  [name] Kitchen docusate sodium (COLACE) 50 MG capsule Take 50 mg by mouth every other day.  . hydrocortisone 2.5 % lotion Apply topically as needed.   [name] Kitchen ibuprofen (ADVIL,MOTRIN) 200 MG tablet Take 200 mg by mouth every 6 (six) hours as needed.  . loratadine (CLARITIN) 10 MG tablet Take 1 tablet by mouth daily as needed.   . Multiple Vitamins-Minerals (EQ COMPLETE MULTIVITAMIN-ADULT PO) Take by mouth daily at 6 (six) AM.  . naproxen sodium (ALEVE) 220 MG tablet Take 220 mg by mouth as needed.  . pravastatin (PRAVACHOL) 40 MG  tablet Take 1 tablet by mouth once daily  . sertraline (ZOLOFT) 100 MG tablet Take 1 tablet by mouth once daily  . sildenafil (VIAGRA) 100 MG tablet Take 1 tablet by mouth daily as needed.  . [DISCONTINUED] docusate sodium (COLACE) 100 MG capsule Take 50 mg by mouth every other day.  . [DISCONTINUED] naproxen (NAPROSYN) 500 MG tablet Take 1 tablet (500 mg total) by mouth 2 (two) times daily with a meal. As needed for back and leg pain (Patient taking differently: Take 220 mg by mouth daily. As needed for back and leg pain)  . [DISCONTINUED] SUMAtriptan (IMITREX) 100 MG tablet Take 100 mg by mouth every 2 (two) hours as needed for migraine. May repeat in 2 hours if headache persists or recurs. (Patient not taking: Reported on 11/10/2020)   No facility-administered medications prior to visit.    Review of Systems  Constitutional: Negative for activity change, chills, fatigue and fever.  Cardiovascular: Negative for chest pain and palpitations.  Musculoskeletal: Positive for arthralgias, gait problem and myalgias.  Neurological: Negative for tingling and numbness.         Objective    BP 136/84 (BP Location: Left Arm, Patient Position: Sitting, Cuff Size: Large)   Pulse 78   Temp 98.3 F (36.8 C) (Oral)   Resp 16   Wt 196 lb 6.4 oz (89.1 kg)   BMI 32.68 kg/m  BP Readings from Last 3 Encounters:  11/10/20 136/84  10/18/20 138/78  06/21/20 122/82   Wt Readings from Last 3 Encounters:  11/10/20 196 lb 6.4 oz (89.1 kg)  10/18/20 201 lb (91.2 kg)  06/21/20 211 lb 3.2 oz (95.8 kg)      Physical Exam    General: Appearance:    Obese male in no acute distress  Eyes:    PERRL, conjunctiva/corneas clear, EOM's intact       Lungs:     Clear to auscultation bilaterally, respirations unlabored  Heart:    Normal heart rate. Normal rhythm. No murmurs, rubs, or gallops.   MS:   All extremities are intact. No ankle swelling, but slight crepitus of left ankle.   Neurologic:   Awake, alert,  oriented x 3. Diminished s/s plantar aspect both feet.         Assessment & Plan     1. Chronic pain of left ankle Likely chronic sprain due to previous injury. Try Voltaren gel. check- Uric acid Consider orthopedic referral.   2. Numbness and tingling of both feet  - VITAMIN D 25 Hydroxy (Vit-D Deficiency, Fractures) - Vitamin B12  3. Pure hypercholesterolemia He is tolerating pravastatin well with no adverse effects.   - CBC - Lipid panel - Comprehensive metabolic panel     The entirety of the information documented in the History of Present Illness, Review of Systems and Physical Exam were personally obtained by me. Portions of this information were initially documented by the CMA and reviewed by me for thoroughness and accuracy.      Lelon Huh, MD  [facility] [phone] (phone) [phone] (fax)  [place]

## 2020-11-11 ENCOUNTER — Other Ambulatory Visit: Payer: Self-pay

## 2020-11-11 DIAGNOSIS — R200 Anesthesia of skin: Secondary | ICD-10-CM

## 2020-11-11 DIAGNOSIS — G8929 Other chronic pain: Secondary | ICD-10-CM

## 2020-11-11 DIAGNOSIS — R202 Paresthesia of skin: Secondary | ICD-10-CM

## 2020-11-11 LAB — CBC
Hematocrit: 47.7 % (ref 37.5–51.0)
Hemoglobin: 16.4 g/dL (ref 13.0–17.7)
MCH: 30.3 pg (ref 26.6–33.0)
MCHC: 34.4 g/dL (ref 31.5–35.7)
MCV: 88 fL (ref 79–97)
Platelets: 198 10*3/uL (ref 150–450)
RBC: 5.42 x10E6/uL (ref 4.14–5.80)
RDW: 12.5 % (ref 11.6–15.4)
WBC: 6.5 10*3/uL (ref 3.4–10.8)

## 2020-11-11 LAB — LIPID PANEL
Chol/HDL Ratio: 4.3 ratio (ref 0.0–5.0)
Cholesterol, Total: 173 mg/dL (ref 100–199)
HDL: 40 mg/dL (ref >39)
LDL Chol Calc (NIH): 109 mg/dL — ABNORMAL HIGH (ref 0–99)
Triglycerides: 137 mg/dL (ref 0–149)
VLDL Cholesterol Cal: 24 mg/dL (ref 5–40)

## 2020-11-11 LAB — COMPREHENSIVE METABOLIC PANEL
ALT: 29 IU/L (ref 0–44)
AST: 25 IU/L (ref 0–40)
Albumin/Globulin Ratio: 2.6 — ABNORMAL HIGH (ref 1.2–2.2)
Albumin: 4.7 g/dL (ref 3.7–4.7)
Alkaline Phosphatase: 138 IU/L — ABNORMAL HIGH (ref 44–121)
BUN/Creatinine Ratio: 14 (ref 10–24)
BUN: 16 mg/dL (ref 8–27)
Bilirubin Total: 0.9 mg/dL (ref 0.0–1.2)
CO2: 25 mmol/L (ref 20–29)
Calcium: 9.7 mg/dL (ref 8.6–10.2)
Chloride: 104 mmol/L (ref 96–106)
Creatinine, Ser: 1.14 mg/dL (ref 0.76–1.27)
GFR calc Af Amer: 73 mL/min/{1.73_m2} (ref >59)
GFR calc non Af Amer: 63 mL/min/{1.73_m2} (ref >59)
Globulin, Total: 1.8 g/dL (ref 1.5–4.5)
Glucose: 86 mg/dL (ref 65–99)
Potassium: 4.3 mmol/L (ref 3.5–5.2)
Sodium: 145 mmol/L — ABNORMAL HIGH (ref 134–144)
Total Protein: 6.5 g/dL (ref 6.0–8.5)

## 2020-11-11 LAB — VITAMIN B12: Vitamin B-12: 560 pg/mL (ref 232–1245)

## 2020-11-11 LAB — URIC ACID: Uric Acid: 5.9 mg/dL (ref 3.8–8.4)

## 2020-11-11 LAB — VITAMIN D 25 HYDROXY (VIT D DEFICIENCY, FRACTURES): Vit D, 25-Hydroxy: 36.4 ng/mL (ref 30.0–100.0)

## 2020-11-12 ENCOUNTER — Other Ambulatory Visit: Payer: Self-pay

## 2020-11-12 DIAGNOSIS — F32A Depression, unspecified: Secondary | ICD-10-CM

## 2020-11-12 DIAGNOSIS — F411 Generalized anxiety disorder: Secondary | ICD-10-CM

## 2020-11-12 DIAGNOSIS — F329 Major depressive disorder, single episode, unspecified: Secondary | ICD-10-CM

## 2020-11-12 MED ORDER — SERTRALINE HCL 100 MG PO TABS: 100.0000 mg | ORAL_TABLET | Freq: Every day | ORAL | 4 refills | Status: DC

## 2020-11-12 NOTE — Telephone Encounter (Signed)
Patient sent mychart message requesting refill.

## 2020-11-15 ENCOUNTER — Encounter: Payer: Self-pay | Admitting: Family Medicine

## 2020-11-16 ENCOUNTER — Encounter: Payer: Self-pay | Admitting: Family Medicine

## 2020-11-16 ENCOUNTER — Ambulatory Visit (INDEPENDENT_AMBULATORY_CARE_PROVIDER_SITE_OTHER): Payer: PPO | Admitting: Family Medicine

## 2020-11-16 ENCOUNTER — Other Ambulatory Visit: Payer: Self-pay

## 2020-11-16 VITALS — BP 143/82 | HR 69 | Temp 98.0°F | Resp 16 | Ht 68.0 in | Wt 193.0 lb

## 2020-11-16 DIAGNOSIS — R202 Paresthesia of skin: Secondary | ICD-10-CM

## 2020-11-16 DIAGNOSIS — R200 Anesthesia of skin: Secondary | ICD-10-CM

## 2020-11-16 NOTE — Progress Notes (Signed)
Established patient visit   Patient: Cameron King   DOB: 06/23/1946   75 y.o. Male  MRN: [id number] Visit Date: 11/16/2020  Today's healthcare provider: Lelon Huh, MD   Chief Complaint  Patient presents with  . Follow-up  . Ankle Pain   Subjective    HPI  Follow up for Ankle pain   The patient was last seen for this 1 weeks ago. Changes made at last visit include starting OTC Voltaren gel . Patient also mentions that he takes Vit b12 and Vit D supplements.   He reports good compliance with treatment. He feels that condition is Improved. He is not having side effects.   Foot numbness He was seen last week for numbness of both feet. Follow up labs have been normal. He was referred to podiatry and is scheduled next week, but states he is now having trouble keeping his balance when in dark roomss.   Office Visit on 11/10/2020  Component Date Value Ref Range Status  . WBC 11/10/2020 6.5  3.4 - 10.8 x10E3/uL Final  . RBC 11/10/2020 5.42  4.14 - 5.80 x10E6/uL Final  . Hemoglobin 11/10/2020 16.4  13.0 - 17.7 g/dL Final  . Hematocrit 11/10/2020 47.7  37.5 - 51.0 % Final  . MCV 11/10/2020 88  79 - 97 fL Final  . MCH 11/10/2020 30.3  26.6 - 33.0 pg Final  . MCHC 11/10/2020 34.4  31.5 - 35.7 g/dL Final  . RDW 11/10/2020 12.5  11.6 - 15.4 % Final  . Platelets 11/10/2020 198  150 - 450 x10E3/uL Final  . Cholesterol, Total 11/10/2020 173  100 - 199 mg/dL Final  . Triglycerides 11/10/2020 137  0 - 149 mg/dL Final  . HDL 11/10/2020 40  >39 mg/dL Final  . VLDL Cholesterol Cal 11/10/2020 24  5 - 40 mg/dL Final  . LDL Chol Calc (NIH) 11/10/2020 109* 0 - 99 mg/dL Final  . Chol/HDL Ratio 11/10/2020 4.3  0.0 - 5.0 ratio Final   Comment:                                   T. Chol/HDL Ratio                                             Men  Women                               1/2 Avg.Risk  3.4    3.3                                   Avg.Risk  5.0    4.4                                 2X Avg.Risk  9.6    7.1                                3X Avg.Risk 23.4   11.0   . Glucose 11/10/2020 86  65 - 99 mg/dL Final  .  BUN 11/10/2020 16  8 - 27 mg/dL Final  . Creatinine, Ser 11/10/2020 1.14  0.76 - 1.27 mg/dL Final  . GFR calc non Af Amer 11/10/2020 63  >59 mL/min/1.73 Final  . GFR calc Af Amer 11/10/2020 73  >59 mL/min/1.73 Final   Comment: **In accordance with recommendations from the NKF-ASN Task force,**   [facility] is in the process of updating its eGFR calculation to the   2021 CKD-EPI creatinine equation that estimates kidney function   without a race variable.   . BUN/Creatinine Ratio 11/10/2020 14  10 - 24 Final  . Sodium 11/10/2020 145* 134 - 144 mmol/L Final  . Potassium 11/10/2020 4.3  3.5 - 5.2 mmol/L Final  . Chloride 11/10/2020 104  96 - 106 mmol/L Final  . CO2 11/10/2020 25  20 - 29 mmol/L Final  . Calcium 11/10/2020 9.7  8.6 - 10.2 mg/dL Final  . Total Protein 11/10/2020 6.5  6.0 - 8.5 g/dL Final  . Albumin 11/10/2020 4.7  3.7 - 4.7 g/dL Final  . Globulin, Total 11/10/2020 1.8  1.5 - 4.5 g/dL Final  . Albumin/Globulin Ratio 11/10/2020 2.6* 1.2 - 2.2 Final  . Bilirubin Total 11/10/2020 0.9  0.0 - 1.2 mg/dL Final  . Alkaline Phosphatase 11/10/2020 138* 44 - 121 IU/L Final  . AST 11/10/2020 25  0 - 40 IU/L Final  . ALT 11/10/2020 29  0 - 44 IU/L Final  . Vit D, 25-Hydroxy 11/10/2020 36.4  30.0 - 100.0 ng/mL Final   Comment: Vitamin D deficiency has been defined by the Institute of Medicine and an Endocrine Society practice guideline as a level of serum 25-OH vitamin D less than 20 ng/mL (1,2). The Endocrine Society went on to further define vitamin D insufficiency as a level between 21 and 29 ng/mL (2). 1. IOM (Institute of Medicine). 2010. Dietary reference    intakes for calcium and D. [place]: The    [employer]. 2. Holick MF, Binkley [place], Bischoff-Ferrari HA, et al.    Evaluation, treatment, and prevention of vitamin D     deficiency: an Endocrine Society clinical practice    guideline. JCEM. 2011 Jul; 96(7):1911-30.   . Vitamin B-12 11/10/2020 560  232 - 1,245 pg/mL Final  . Uric Acid 11/10/2020 5.9  3.8 - 8.4 mg/dL Final              Therapeutic target for gout patients: <6.0       Medications: Outpatient Medications Prior to Visit  Medication Sig  . aspirin EC 81 MG tablet Take 81 mg by mouth daily. Swallow whole.  . busPIRone (BUSPAR) 7.5 MG tablet Take 1 tablet (7.5 mg total) by mouth 2 (two) times daily.  [name] Kitchen docusate sodium (COLACE) 50 MG capsule Take 50 mg by mouth every other day.  . hydrocortisone 2.5 % lotion Apply topically as needed.   [name] Kitchen ibuprofen (ADVIL,MOTRIN) 200 MG tablet Take 200 mg by mouth every 6 (six) hours as needed.  . loratadine (CLARITIN) 10 MG tablet Take 1 tablet by mouth daily as needed.   . Multiple Vitamins-Minerals (EQ COMPLETE MULTIVITAMIN-ADULT PO) Take by mouth daily at 6 (six) AM.  . naproxen sodium (ALEVE) 220 MG tablet Take 220 mg by mouth as needed.  . pravastatin (PRAVACHOL) 40 MG tablet Take 1 tablet by mouth once daily  . sertraline (ZOLOFT) 100 MG tablet Take 1 tablet (100 mg total) by mouth daily.  . sildenafil (VIAGRA) 100 MG tablet Take 1 tablet by mouth daily  as needed.   No facility-administered medications prior to visit.    Review of Systems  Constitutional: Negative for appetite change, chills and fever.  Respiratory: Negative for chest tightness, shortness of breath and wheezing.   Cardiovascular: Negative for chest pain and palpitations.  Gastrointestinal: Negative for abdominal pain, nausea and vomiting.       Objective    BP (!) 143/82   Pulse 69   Temp 98 F (36.7 C)   Resp 16   Ht '5\' 8"'  (1.727 m)   Wt 193 lb (87.5 kg)   BMI 29.35 kg/m     Physical Exam    No results found for any visits on 11/16/20.  Assessment & Plan     1. Numbness and tingling of both feet Has already been seen and evaluated last week, just needs  neurology referral placed.  - Ambulatory referral to Neurology        The entirety of the information documented in the History of Present Illness, Review of Systems and Physical Exam were personally obtained by me. Portions of this information were initially documented by the CMA and reviewed by me for thoroughness and accuracy.      Lelon Huh, MD  [address] [facility] [phone] (phone) [phone] (fax)  [place]

## 2020-11-22 ENCOUNTER — Other Ambulatory Visit: Payer: Self-pay

## 2020-11-22 ENCOUNTER — Ambulatory Visit: Payer: PPO | Admitting: Podiatry

## 2020-11-22 ENCOUNTER — Ambulatory Visit (INDEPENDENT_AMBULATORY_CARE_PROVIDER_SITE_OTHER): Payer: PPO

## 2020-11-22 ENCOUNTER — Other Ambulatory Visit: Payer: Self-pay | Admitting: Podiatry

## 2020-11-22 ENCOUNTER — Encounter: Payer: Self-pay | Admitting: Podiatry

## 2020-11-22 DIAGNOSIS — M778 Other enthesopathies, not elsewhere classified: Secondary | ICD-10-CM

## 2020-11-22 DIAGNOSIS — R2689 Other abnormalities of gait and mobility: Secondary | ICD-10-CM

## 2020-11-22 DIAGNOSIS — M722 Plantar fascial fibromatosis: Secondary | ICD-10-CM | POA: Diagnosis not present

## 2020-11-22 DIAGNOSIS — R269 Unspecified abnormalities of gait and mobility: Secondary | ICD-10-CM

## 2020-11-22 MED ORDER — TRIAMCINOLONE ACETONIDE 40 MG/ML IJ SUSP
60.0000 mg | Freq: Once | INTRAMUSCULAR | Status: AC
  Administered 2020-11-22: 60 mg

## 2020-11-22 NOTE — Progress Notes (Signed)
Subjective:  Patient ID: Cameron King, male    DOB: May 31, 1946,  MRN: [id number] HPI Chief Complaint  Patient presents with  . Foot Pain    Plantar forefoot left - aching x few years, fell through top step initially and bent the toes back, loses balance easily and has fallen a lot, some weakness in ankle, almost feels numb  . New Patient (Initial Visit)    75 y.o. male presents with the above complaint.   ROS: Denies fever chills nausea vomiting muscle aches pains calf pain back pain chest pain shortness of breath.  Does relate an easy gait and a fall risk.  States that he is fallen several times but avoided  hitting his head.  Past Medical History:  Diagnosis Date  . Depression   . High cholesterol   . Hypertension   . Meniere disease   . Ocular Migraines   . Shingles 07/16/2015   of eye    Past Surgical History:  Procedure Laterality Date  . COLONOSCOPY    . COLONOSCOPY WITH PROPOFOL N/A 10/29/2018   Procedure: COLONOSCOPY WITH PROPOFOL;  Surgeon: Lucilla Lame, MD;  Location: [facility] ENDOSCOPY;  Service: Endoscopy;  Laterality: N/A;  . SIGMOIDOSCOPY  2008   Dr. Jamal Collin. Normal per patient report    Current Outpatient Medications:  .  aspirin EC 81 MG tablet, Take 81 mg by mouth daily. Swallow whole., Disp: , Rfl:  .  busPIRone (BUSPAR) 7.5 MG tablet, Take 1 tablet (7.5 mg total) by mouth 2 (two) times daily., Disp: 180 tablet, Rfl: 1 .  docusate sodium (COLACE) 50 MG capsule, Take 50 mg by mouth every other day., Disp: , Rfl:  .  hydrocortisone 2.5 % lotion, Apply topically as needed. , Disp: , Rfl:  .  ibuprofen (ADVIL,MOTRIN) 200 MG tablet, Take 200 mg by mouth every 6 (six) hours as needed., Disp: , Rfl:  .  loratadine (CLARITIN) 10 MG tablet, Take 1 tablet by mouth daily as needed. , Disp: , Rfl:  .  Multiple Vitamins-Minerals (EQ COMPLETE MULTIVITAMIN-ADULT PO), Take by mouth daily at 6 (six) AM., Disp: , Rfl:  .  naproxen sodium (ALEVE) 220 MG tablet, Take 220 mg by  mouth as needed., Disp: , Rfl:  .  pravastatin (PRAVACHOL) 40 MG tablet, Take 1 tablet by mouth once daily, Disp: 90 tablet, Rfl: 1 .  sertraline (ZOLOFT) 100 MG tablet, Take 1 tablet (100 mg total) by mouth daily., Disp: 90 tablet, Rfl: 4 .  sildenafil (VIAGRA) 100 MG tablet, Take 1 tablet by mouth daily as needed., Disp: , Rfl:   Allergies  Allergen Reactions  . Prednisone Other (See Comments)    Vomiting   Review of Systems Objective:  There were no vitals filed for this visit.  General: Well developed, nourished, in no acute distress, alert and oriented x3   Dermatological: Skin is warm, dry and supple bilateral. Nails x 10 are well maintained; remaining integument appears unremarkable at this time. There are no open sores, no preulcerative lesions, no rash or signs of infection present.  Though he does have good sensation to the plantar aspect of the foot he contends that his feet are numb and tingly.  Vascular: Dorsalis Pedis artery and Posterior Tibial artery pedal pulses are 2/4 bilateral with immedate capillary fill time. Pedal hair growth present. No varicosities and no lower extremity edema present bilateral.   Neruologic: Grossly intact via light touch bilateral. Vibratory intact via tuning fork bilateral. Protective threshold with Jobie Quaker  monofilament intact to all pedal sites bilateral. Patellar and Achilles deep tendon reflexes 2+ bilateral. No Babinski or clonus noted bilateral.   Musculoskeletal: No gross boney pedal deformities bilateral. No pain, crepitus, or limitation noted with foot and ankle range of motion bilateral. Muscular strength 4 /5 in all groups tested bilateral.  Hammertoe deformities of the left foot are semiflexible with exception of the second metatarsophalangeal joint left.  That appears to be slightly dislocated dorsally though radiographically does not appear too bad.  He does have pain on palpation medial calcaneal tubercles bilateral.  Gait:  Unassisted, Nonantalgic.  But he does walk with widespread gait and touches stationary items as he walks.   Radiographs:  Radiographs taken today demonstrate an osseously mature individual there is negative findings on ankle views.  Negative finding on the AP and oblique views of the left foot.  There is some slight dislocation of the second metatarsophalangeal joint.  Assessment & Plan:   Assessment: After speaking with this patient for quite some time it is obvious that he is concerned about his neurologic state.  He obviously searches for words and states that this has been more recent but has not been diagnosed with any neurologic deficit.  He also has capsulitis of the second metatarsophalangeal joint left foot  He also has plantar fasciitis bilateral.  Idiopathic neuropathy bilateral foot  Plan: Discussed etiology pathology conservative versus surgical therapies.  Injected the bilateral heels today with 10 mg of Kenalog 5 mg of Marcaine to the point of maximal tenderness.  I injected 2 mg of dexamethasone and local anesthetic directly into the second metatarsophalangeal joint.  I would like to refer him to neurology for full neurological work-up including the neuropathy.  I feel that they also need to evaluate him for dementia and possibly any other's causes that may result in fall risk.  If all neurological tests are negative I think the best thing for him would be to see physical therapy for gait training and balance training and follow-up with [place] for the biomechanical and the anatomical pathologies.    Suzette Flagler T. [place], [place]

## 2020-11-24 ENCOUNTER — Encounter: Payer: Self-pay | Admitting: Podiatry

## 2020-12-20 ENCOUNTER — Other Ambulatory Visit: Payer: Self-pay

## 2020-12-20 ENCOUNTER — Encounter: Payer: Self-pay | Admitting: Family Medicine

## 2020-12-20 ENCOUNTER — Ambulatory Visit (INDEPENDENT_AMBULATORY_CARE_PROVIDER_SITE_OTHER): Payer: PPO | Admitting: Family Medicine

## 2020-12-20 VITALS — BP 148/89 | HR 68 | Temp 97.0°F | Ht 66.0 in | Wt 190.4 lb

## 2020-12-20 DIAGNOSIS — Z0000 Encounter for general adult medical examination without abnormal findings: Secondary | ICD-10-CM | POA: Diagnosis not present

## 2020-12-20 DIAGNOSIS — Z136 Encounter for screening for cardiovascular disorders: Secondary | ICD-10-CM | POA: Diagnosis not present

## 2020-12-20 DIAGNOSIS — Z289 Immunization not carried out for unspecified reason: Secondary | ICD-10-CM

## 2020-12-20 MED ORDER — SHINGRIX 50 MCG/0.5ML IM SUSR: 0.5000 mL | Freq: Once | INTRAMUSCULAR | 0 refills | Status: AC

## 2020-12-20 NOTE — Progress Notes (Signed)
Complete physical exam   Patient: Cameron King   DOB: 06-27-1946   75 y.o. Male  MRN: [id number] Visit Date: 12/20/2020  Today's healthcare provider: Lelon Huh, MD   Chief Complaint  Patient presents with  . Annual Exam  I,Porsha C McClurkin,acting as a scribe for Lelon Huh, MD.,have documented all relevant documentation on the behalf of Lelon Huh, MD,as directed by  Lelon Huh, MD while in the presence of Lelon Huh, MD.  Subjective    Cameron King is a 75 y.o. male who presents today for a complete physical exam.  He reports consuming a general diet. The patient does not participate in regular exercise at present. He generally feels fairly well. He reports sleeping poorly. He does not have additional problems to discuss today.  HPI  AWE was w/ McKenzie on 09/27/2020  Past Medical History:  Diagnosis Date  . Depression   . High cholesterol   . Hypertension   . Meniere disease   . Ocular Migraines   . Shingles 07/16/2015   of eye    Past Surgical History:  Procedure Laterality Date  . COLONOSCOPY    . COLONOSCOPY WITH PROPOFOL N/A 10/29/2018   Procedure: COLONOSCOPY WITH PROPOFOL;  Surgeon: Lucilla Lame, MD;  Location: [facility] ENDOSCOPY;  Service: Endoscopy;  Laterality: N/A;  . SIGMOIDOSCOPY  2008   Dr. Jamal Collin. Normal per patient report   Social History   Socioeconomic History  . Marital status: Widowed    Spouse name: Not on file  . Number of children: 2  . Years of education: Not on file  . Highest education level: Associate degree: occupational, Hotel manager, or vocational program  Occupational History  . Occupation: Retired    Comment: Retired 2009  Tobacco Use  . Smoking status: Former Smoker    Types: Cigarettes  . Smokeless tobacco: Never Used  . Tobacco comment: quit over 30 years ago  Vaping Use  . Vaping Use: Never used  Substance and Sexual Activity  . Alcohol use: Yes    Alcohol/week: 0.0 standard drinks    Comment: rare wine   . Drug use: No  . Sexual activity: Not on file  Other Topics Concern  . Not on file  Social History Narrative   Pt currently has a girlfriend who has had a heart attack recently. This has added to his stress level.    Social Determinants of Health   Financial Resource Strain: Low Risk   . Difficulty of Paying Living Expenses: Not hard at all  Food Insecurity: No Food Insecurity  . Worried About Charity fundraiser in the Last Year: Never true  . Ran Out of Food in the Last Year: Never true  Transportation Needs: No Transportation Needs  . Lack of Transportation (Medical): No  . Lack of Transportation (Non-Medical): No  Physical Activity: Inactive  . Days of Exercise per Week: 0 days  . Minutes of Exercise per Session: 0 min  Stress: Stress Concern Present  . Feeling of Stress : Rather much  Social Connections: Moderately Integrated  . Frequency of Communication with Friends and Family: Three times a week  . Frequency of Social Gatherings with Friends and Family: More than three times a week  . Attends Religious Services: More than 4 times per year  . Active Member of Clubs or Organizations: No  . Attends Archivist Meetings: Never  . Marital Status: Living with partner  Intimate Partner Violence: Not At Risk  .  Fear of Current or Ex-Partner: No  . Emotionally Abused: No  . Physically Abused: No  . Sexually Abused: No   Family Status  Relation Name Status  . Father  Deceased       died of esophageal cance  . Son  Alive  . Son  Alive   Family History  Adopted: Yes  Problem Relation Age of Onset  . Cancer - Other Father   . Stroke Son    Allergies  Allergen Reactions  . Prednisone Other (See Comments)    Vomiting    Patient Care Team: Birdie Sons, MD as PCP - General (Family Medicine) Lucilla Lame, MD as Consulting Physician (Gastroenterology)   Medications: Outpatient Medications Prior to Visit  Medication Sig  . aspirin EC 81 MG tablet Take  81 mg by mouth daily. Swallow whole.  . busPIRone (BUSPAR) 7.5 MG tablet Take 1 tablet (7.5 mg total) by mouth 2 (two) times daily.  [name] Kitchen docusate sodium (COLACE) 50 MG capsule Take 50 mg by mouth every other day.  . hydrocortisone 2.5 % lotion Apply topically as needed.   [name] Kitchen ibuprofen (ADVIL,MOTRIN) 200 MG tablet Take 200 mg by mouth every 6 (six) hours as needed.  . loratadine (CLARITIN) 10 MG tablet Take 1 tablet by mouth daily as needed.   . Multiple Vitamins-Minerals (EQ COMPLETE MULTIVITAMIN-ADULT PO) Take by mouth daily at 6 (six) AM.  . naproxen sodium (ALEVE) 220 MG tablet Take 220 mg by mouth as needed.  . pravastatin (PRAVACHOL) 40 MG tablet Take 1 tablet by mouth once daily  . sertraline (ZOLOFT) 100 MG tablet Take 1 tablet (100 mg total) by mouth daily.  . sildenafil (VIAGRA) 100 MG tablet Take 1 tablet by mouth daily as needed.   No facility-administered medications prior to visit.    Review of Systems  Constitutional: Positive for activity change.  HENT: Negative for congestion, postnasal drip, rhinorrhea, sinus pressure and sinus pain.   Eyes: Positive for redness.  Respiratory: Negative.   Cardiovascular: Positive for leg swelling.  Gastrointestinal: Positive for constipation.  Endocrine: Negative.   Genitourinary: Negative.   Musculoskeletal: Positive for myalgias.  Skin: Negative.   Allergic/Immunologic: Negative.   Neurological: Positive for weakness.  Psychiatric/Behavioral: Positive for confusion and decreased concentration. The patient is nervous/anxious.     Last CBC Lab Results  Component Value Date   WBC 6.5 11/10/2020   HGB 16.4 11/10/2020   HCT 47.7 11/10/2020   MCV 88 11/10/2020   MCH 30.3 11/10/2020   RDW 12.5 11/10/2020   PLT 198 95/18/8416   Last metabolic panel Lab Results  Component Value Date   GLUCOSE 86 11/10/2020   NA 145 (H) 11/10/2020   K 4.3 11/10/2020   CL 104 11/10/2020   CO2 25 11/10/2020   BUN 16 11/10/2020   CREATININE 1.14  11/10/2020   GFRNONAA 63 11/10/2020   GFRAA 73 11/10/2020   CALCIUM 9.7 11/10/2020   PHOS 2.3 (L) 07/19/2015   PROT 6.5 11/10/2020   ALBUMIN 4.7 11/10/2020   LABGLOB 1.8 11/10/2020   AGRATIO 2.6 (H) 11/10/2020   BILITOT 0.9 11/10/2020   ALKPHOS 138 (H) 11/10/2020   AST 25 11/10/2020   ALT 29 11/10/2020   Last lipids Lab Results  Component Value Date   CHOL 173 11/10/2020   HDL 40 11/10/2020   LDLCALC 109 (H) 11/10/2020   TRIG 137 11/10/2020   CHOLHDL 4.3 11/10/2020   Last thyroid functions Lab Results  Component Value Date  TSH 1.660 12/01/2019      Objective    BP (!) 148/89 (BP Location: Left Arm, Patient Position: Sitting, Cuff Size: Normal)   Pulse 68   Temp (!) 97 F (36.1 C) (Temporal)   Ht 5\' 6"  (1.676 m)   Wt 190 lb 6.4 oz (86.4 kg)   SpO2 100%   BMI 30.73 kg/m    Physical Exam    General Appearance:    Obese male. Alert, cooperative, in no acute distress, appears stated age  Head:    Normocephalic, without obvious abnormality, atraumatic  Eyes:    PERRL, conjunctiva/corneas clear, EOM's intact, fundi    benign, both eyes       Ears:    Normal TM's and external ear canals, both ears  Neck:   Supple, symmetrical, trachea midline, no adenopathy;       thyroid:  No enlargement/tenderness/nodules; no carotid   bruit or JVD  Back:     Symmetric, no curvature, ROM normal, no CVA tenderness  Lungs:     Clear to auscultation bilaterally, respirations unlabored  Chest wall:    No tenderness or deformity  Heart:    Normal heart rate. Normal rhythm. No murmurs, rubs, or gallops.  S1 and S2 normal  Abdomen:     Soft, non-tender, bowel sounds active all four quadrants,    no masses, no organomegaly  Genitalia:    deferred  Rectal:    deferred  Extremities:   All extremities are intact. No cyanosis or edema  Pulses:   2+ and symmetric all extremities  Skin:   Skin color, texture, turgor normal, no rashes or lesions  Lymph nodes:   Cervical, supraclavicular,  and axillary nodes normal  Neurologic:   CNII-XII intact. Normal strength, sensation and reflexes      throughout     Last depression screening scores PHQ 2/9 Scores 12/20/2020 09/30/2020 06/21/2020  PHQ - 2 Score 2 2 3   PHQ- 9 Score 11 8 15    Last fall risk screening Fall Risk  12/20/2020  Falls in the past year? 1  Number falls in past yr: 0  Comment -  Injury with Fall? 0  Comment -  Risk for fall due to : History of fall(s);No Fall Risks  Follow up Falls evaluation completed;Education provided;Falls prevention discussed   Last Audit-C alcohol use screening Alcohol Use Disorder Test (AUDIT) 12/20/2020  1. How often do you have a drink containing alcohol? 1  2. How many drinks containing alcohol do you have on a typical day when you are drinking? 0  3. How often do you have six or more drinks on one occasion? 0  AUDIT-C Score 1  Alcohol Brief Interventions/Follow-up -   A score of 3 or more in women, and 4 or more in men indicates increased risk for alcohol abuse, EXCEPT if all of the points are from question 1   No results found for any visits on 12/20/20.  Assessment & Plan    Routine Health Maintenance and Physical Exam  Exercise Activities and Dietary recommendations Goals    . DIET - REDUCE PORTION SIZE     Recommend cutting portion sizes in half and eating 3 small meals a day with 2 healthy snacks in between.     . Prevent falls     Recommend to remove any items from the home that may cause slips or trips.       Immunization History  Administered Date(s) Administered  . Fluad Quad(high Dose  65+) 06/21/2020  . Influenza, High Dose Seasonal PF 07/19/2015, 08/03/2016, 06/28/2017, 06/12/2018  . Influenza,inj,Quad PF,6+ Mos 07/28/2019  . PFIZER(Purple Top)SARS-COV-2 Vaccination 11/07/2019, 12/03/2019, 08/09/2020  . Pneumococcal Conjugate-13 08/24/2014  . Pneumococcal Polysaccharide-23 05/01/2012  . Td 09/26/2003  . Tdap 07/19/2015    Health Maintenance  Topic  Date Due  . COLONOSCOPY (Pts 45-43yrs Insurance coverage will need to be confirmed)  10/30/2023  . TETANUS/TDAP  07/18/2025  . INFLUENZA VACCINE  Completed  . COVID-19 Vaccine  Completed  . Hepatitis C Screening  Completed  . PNA vac Low Risk Adult  Completed  . HPV VACCINES  Aged Out    Discussed health benefits of physical activity, and encouraged him to engage in regular exercise appropriate for his age and condition.   2. Prescription for Shingrix. Vaccine not administered in office.   - Zoster Vaccine Adjuvanted [facility]) injection; Inject 0.5 mLs into the muscle once for 1 dose.  Dispense: 0.5 mL; Refill: 0  3. Encounter for special screening examination for cardiovascular disorder  - EKG 12-Lead        The entirety of the information documented in the History of Present Illness, Review of Systems and Physical Exam were personally obtained by me. Portions of this information were initially documented by the CMA and reviewed by me for thoroughness and accuracy.      Lelon Huh, MD  [facility] [phone] (phone) [phone] (fax)  [place]

## 2020-12-20 NOTE — Patient Instructions (Addendum)
. Please review the attached list of medications and notify my office if there are any errors.   . Please bring all of your medications to every appointment so we can make sure that our medication list is the same as yours.    The CDC recommends two doses of Shingrix (the shingles vaccine) separated by 2 to 6 months for adults age 74 years and older. I recommend checking with your pharmacy plan regarding coverage for this vaccine.       [email or web address]">  DASH Eating Plan DASH stands for Dietary Approaches to Stop Hypertension. The DASH eating plan is a healthy eating plan that has been shown to:  Reduce high blood pressure (hypertension).  Reduce your risk for type 2 diabetes, heart disease, and stroke.  Help with weight loss. What are tips for following this plan? Reading food labels  Check food labels for the amount of salt (sodium) per serving. Choose foods with less than 5 percent of the Daily Value of sodium. Generally, foods with less than 300 milligrams (mg) of sodium per serving fit into this eating plan.  To find whole grains, look for the word "whole" as the first word in the ingredient list. Shopping  Buy products labeled as "low-sodium" or "no salt added."  Buy fresh foods. Avoid canned foods and pre-made or frozen meals. Cooking  Avoid adding salt when cooking. Use salt-free seasonings or herbs instead of table salt or sea salt. Check with your health care provider or pharmacist before using salt substitutes.  Do not fry foods. Cook foods using healthy methods such as baking, boiling, grilling, roasting, and broiling instead.  Cook with heart-healthy oils, such as olive, canola, avocado, soybean, or sunflower oil. Meal planning  Eat a balanced diet that includes: ? 4 or more servings of fruits and 4 or more servings of vegetables each day. Try to fill one-half of your plate with fruits and vegetables. ? 6-8  servings of whole grains each day. ? Less than 6 oz (170 g) of lean meat, poultry, or fish each day. A 3-oz (85-g) serving of meat is about the same size as a deck of cards. One egg equals 1 oz (28 g). ? 2-3 servings of low-fat dairy each day. One serving is 1 cup (237 mL). ? 1 serving of nuts, seeds, or beans 5 times each week. ? 2-3 servings of heart-healthy fats. Healthy fats called omega-3 fatty acids are found in foods such as walnuts, flaxseeds, fortified milks, and eggs. These fats are also found in cold-water fish, such as sardines, salmon, and mackerel.  Limit how much you eat of: ? Canned or prepackaged foods. ? Food that is high in trans fat, such as some fried foods. ? Food that is high in saturated fat, such as fatty meat. ? Desserts and other sweets, sugary drinks, and other foods with added sugar. ? Full-fat dairy products.  Do not salt foods before eating.  Do not eat more than 4 egg yolks a week.  Try to eat at least 2 vegetarian meals a week.  Eat more home-cooked food and less restaurant, buffet, and fast food.   Lifestyle  When eating at a restaurant, ask that your food be prepared with less salt or no salt, if possible.  If you drink alcohol: ? Limit how much you use to:  0-1 drink a day for women who are not pregnant.  0-2 drinks a day for men. ? Be aware of how much alcohol is in your  drink. In the [place] one drink equals one 12 oz bottle of beer (355 mL), one 5 oz glass of wine (148 mL), or one 1 oz glass of hard liquor (44 mL). General information  Avoid eating more than 2,300 mg of salt a day. If you have hypertension, you may need to reduce your sodium intake to 1,500 mg a day.  Work with your health care provider to maintain a healthy body weight or to lose weight. Ask what an ideal weight is for you.  Get at least 30 minutes of exercise that causes your heart to beat faster (aerobic exercise) most days of the week. Activities may include walking,  swimming, or biking.  Work with your health care provider or dietitian to adjust your eating plan to your individual calorie needs. What foods should I eat? Fruits All fresh, dried, or frozen fruit. Canned fruit in natural juice (without added sugar). Vegetables Fresh or frozen vegetables (raw, steamed, roasted, or grilled). Low-sodium or reduced-sodium tomato and vegetable juice. Low-sodium or reduced-sodium tomato sauce and tomato paste. Low-sodium or reduced-sodium canned vegetables. Grains Whole-grain or whole-wheat bread. Whole-grain or whole-wheat pasta. Brown rice. [place]. Bulgur. Whole-grain and low-sodium cereals. Pita bread. Low-fat, low-sodium crackers. Whole-wheat flour tortillas. Meats and other proteins Skinless chicken or [place]. Ground chicken or [place]. Pork with fat trimmed off. Fish and seafood. Egg whites. Dried beans, peas, or lentils. Unsalted nuts, nut butters, and seeds. Unsalted canned beans. Lean cuts of beef with fat trimmed off. Low-sodium, lean precooked or cured meat, such as sausages or meat loaves. Dairy Low-fat (1%) or fat-free (skim) milk. Reduced-fat, low-fat, or fat-free cheeses. Nonfat, low-sodium ricotta or cottage cheese. Low-fat or nonfat yogurt. Low-fat, low-sodium cheese. Fats and oils Soft margarine without trans fats. Vegetable oil. Reduced-fat, low-fat, or light mayonnaise and salad dressings (reduced-sodium). Canola, safflower, olive, avocado, soybean, and sunflower oils. Avocado. Seasonings and condiments Herbs. Spices. Seasoning mixes without salt. Other foods Unsalted popcorn and pretzels. Fat-free sweets. The items listed above may not be a complete list of foods and beverages you can eat. Contact a dietitian for more information. What foods should I avoid? Fruits Canned fruit in a light or heavy syrup. Fried fruit. Fruit in cream or butter sauce. Vegetables Creamed or fried vegetables. Vegetables in a cheese sauce. Regular canned  vegetables (not low-sodium or reduced-sodium). Regular canned tomato sauce and paste (not low-sodium or reduced-sodium). Regular tomato and vegetable juice (not low-sodium or reduced-sodium). Angie Fava. Olives. Grains Baked goods made with fat, such as croissants, muffins, or some breads. Dry pasta or rice meal packs. Meats and other proteins Fatty cuts of meat. Ribs. Fried meat. Berniece Salines. Bologna, salami, and other precooked or cured meats, such as sausages or meat loaves. Fat from the back of a pig (fatback). Bratwurst. Salted nuts and seeds. Canned beans with added salt. Canned or smoked fish. Whole eggs or egg yolks. Chicken or [place] with skin. Dairy Whole or 2% milk, cream, and half-and-half. Whole or full-fat cream cheese. Whole-fat or sweetened yogurt. Full-fat cheese. Nondairy creamers. Whipped toppings. Processed cheese and cheese spreads. Fats and oils Butter. Stick margarine. Lard. Shortening. Ghee. Bacon fat. Tropical oils, such as coconut, palm kernel, or palm oil. Seasonings and condiments Onion salt, garlic salt, seasoned salt, table salt, and sea salt. Worcestershire sauce. Tartar sauce. Barbecue sauce. Teriyaki sauce. Soy sauce, including reduced-sodium. Steak sauce. Canned and packaged gravies. Fish sauce. Oyster sauce. Cocktail sauce. Store-bought horseradish. Ketchup. Mustard. Meat flavorings and tenderizers. Bouillon cubes. Hot sauces. Pre-made  or packaged marinades. Pre-made or packaged taco seasonings. Relishes. Regular salad dressings. Other foods Salted popcorn and pretzels. The items listed above may not be a complete list of foods and beverages you should avoid. Contact a dietitian for more information. Where to find more information  National Heart, Lung, and Blood Institute: https://wilson-eaton.com/  American Heart Association: www.heart.org  Academy of Nutrition and Dietetics: www.eatright.Ord: www.kidney.org Summary  The DASH eating plan is a  healthy eating plan that has been shown to reduce high blood pressure (hypertension). It may also reduce your risk for type 2 diabetes, heart disease, and stroke.  When on the DASH eating plan, aim to eat more fresh fruits and vegetables, whole grains, lean proteins, low-fat dairy, and heart-healthy fats.  With the DASH eating plan, you should limit salt (sodium) intake to 2,300 mg a day. If you have hypertension, you may need to reduce your sodium intake to 1,500 mg a day.  Work with your health care provider or dietitian to adjust your eating plan to your individual calorie needs. This information is not intended to replace advice given to you by your health care provider. Make sure you discuss any questions you have with your health care provider. Document Revised: 08/15/2019 Document Reviewed: 08/15/2019 Elsevier Patient Education  2021 [employer].

## 2020-12-29 DIAGNOSIS — R42 Dizziness and giddiness: Secondary | ICD-10-CM | POA: Diagnosis not present

## 2020-12-29 DIAGNOSIS — F419 Anxiety disorder, unspecified: Secondary | ICD-10-CM | POA: Diagnosis not present

## 2020-12-29 DIAGNOSIS — G9389 Other specified disorders of brain: Secondary | ICD-10-CM | POA: Diagnosis not present

## 2020-12-29 DIAGNOSIS — R202 Paresthesia of skin: Secondary | ICD-10-CM | POA: Diagnosis not present

## 2020-12-29 DIAGNOSIS — R200 Anesthesia of skin: Secondary | ICD-10-CM | POA: Diagnosis not present

## 2020-12-29 DIAGNOSIS — R262 Difficulty in walking, not elsewhere classified: Secondary | ICD-10-CM | POA: Diagnosis not present

## 2020-12-29 DIAGNOSIS — R296 Repeated falls: Secondary | ICD-10-CM | POA: Diagnosis not present

## 2020-12-31 ENCOUNTER — Telehealth: Payer: Self-pay

## 2020-12-31 NOTE — Telephone Encounter (Signed)
Copied from [id number]. Topic: General - Inquiry >> Dec 31, 2020  1:57 PM Greggory Keen D wrote: Reason for CRM: Pt called asking if he is supposed to be taking the generic Paxil.  He is confused about whether he is supposed to or not.  He said at some offices it is on his list and others he is not.  Pt stated he is now seeing a neurologist and he is going to be doing some test and adding some medications  CB#  [phone]

## 2020-12-31 NOTE — Telephone Encounter (Signed)
No, he was changed from paxil (paroxetine) to Zoloft (sertraline) back in 2021.

## 2020-12-31 NOTE — Telephone Encounter (Signed)
Patient advised.

## 2021-01-25 DIAGNOSIS — R202 Paresthesia of skin: Secondary | ICD-10-CM | POA: Diagnosis not present

## 2021-01-25 DIAGNOSIS — R200 Anesthesia of skin: Secondary | ICD-10-CM | POA: Diagnosis not present

## 2021-02-08 DIAGNOSIS — R200 Anesthesia of skin: Secondary | ICD-10-CM | POA: Insufficient documentation

## 2021-03-30 DIAGNOSIS — R202 Paresthesia of skin: Secondary | ICD-10-CM | POA: Diagnosis not present

## 2021-03-30 DIAGNOSIS — G9389 Other specified disorders of brain: Secondary | ICD-10-CM | POA: Diagnosis not present

## 2021-03-30 DIAGNOSIS — R42 Dizziness and giddiness: Secondary | ICD-10-CM | POA: Diagnosis not present

## 2021-03-30 DIAGNOSIS — R200 Anesthesia of skin: Secondary | ICD-10-CM | POA: Diagnosis not present

## 2021-06-01 DIAGNOSIS — R262 Difficulty in walking, not elsewhere classified: Secondary | ICD-10-CM | POA: Diagnosis not present

## 2021-06-01 DIAGNOSIS — R200 Anesthesia of skin: Secondary | ICD-10-CM | POA: Diagnosis not present

## 2021-06-01 DIAGNOSIS — R202 Paresthesia of skin: Secondary | ICD-10-CM | POA: Diagnosis not present

## 2021-06-01 DIAGNOSIS — R42 Dizziness and giddiness: Secondary | ICD-10-CM | POA: Diagnosis not present

## 2021-06-01 DIAGNOSIS — R278 Other lack of coordination: Secondary | ICD-10-CM | POA: Diagnosis not present

## 2021-06-01 DIAGNOSIS — R296 Repeated falls: Secondary | ICD-10-CM | POA: Diagnosis not present

## 2021-06-01 DIAGNOSIS — F419 Anxiety disorder, unspecified: Secondary | ICD-10-CM | POA: Diagnosis not present

## 2021-06-01 DIAGNOSIS — G9389 Other specified disorders of brain: Secondary | ICD-10-CM | POA: Diagnosis not present

## 2021-06-01 DIAGNOSIS — H9193 Unspecified hearing loss, bilateral: Secondary | ICD-10-CM | POA: Diagnosis not present

## 2021-08-08 ENCOUNTER — Encounter: Payer: Self-pay | Admitting: Family Medicine

## 2021-08-08 DIAGNOSIS — F411 Generalized anxiety disorder: Secondary | ICD-10-CM

## 2021-08-08 DIAGNOSIS — F32A Depression, unspecified: Secondary | ICD-10-CM

## 2021-08-08 MED ORDER — BUSPIRONE HCL 7.5 MG PO TABS: 7.5000 mg | ORAL_TABLET | Freq: Two times a day (BID) | ORAL | 4 refills | Status: DC

## 2021-10-04 DIAGNOSIS — G9389 Other specified disorders of brain: Secondary | ICD-10-CM | POA: Diagnosis not present

## 2021-10-04 DIAGNOSIS — R278 Other lack of coordination: Secondary | ICD-10-CM | POA: Diagnosis not present

## 2021-10-04 DIAGNOSIS — F419 Anxiety disorder, unspecified: Secondary | ICD-10-CM | POA: Diagnosis not present

## 2021-10-04 DIAGNOSIS — R200 Anesthesia of skin: Secondary | ICD-10-CM | POA: Diagnosis not present

## 2021-10-04 DIAGNOSIS — R42 Dizziness and giddiness: Secondary | ICD-10-CM | POA: Diagnosis not present

## 2021-10-04 DIAGNOSIS — R202 Paresthesia of skin: Secondary | ICD-10-CM | POA: Diagnosis not present

## 2021-10-04 DIAGNOSIS — R262 Difficulty in walking, not elsewhere classified: Secondary | ICD-10-CM | POA: Diagnosis not present

## 2021-10-04 DIAGNOSIS — H9193 Unspecified hearing loss, bilateral: Secondary | ICD-10-CM | POA: Insufficient documentation

## 2021-10-05 ENCOUNTER — Other Ambulatory Visit: Payer: Self-pay

## 2021-10-05 ENCOUNTER — Ambulatory Visit (INDEPENDENT_AMBULATORY_CARE_PROVIDER_SITE_OTHER): Payer: PPO

## 2021-10-05 VITALS — BP 140/72 | HR 74 | Temp 98.3°F | Ht 67.0 in | Wt 194.0 lb

## 2021-10-05 DIAGNOSIS — Z23 Encounter for immunization: Secondary | ICD-10-CM | POA: Diagnosis not present

## 2021-10-05 DIAGNOSIS — Z0000 Encounter for general adult medical examination without abnormal findings: Secondary | ICD-10-CM

## 2021-10-05 NOTE — Progress Notes (Signed)
Subjective:   BRYCETON HANTZ is a 76 y.o. male who presents for Medicare Annual/Subsequent preventive examination.  Review of Systems           Objective:    Today's Vitals   10/05/21 1059  BP: 140/72  Pulse: 74  Temp: 98.3 F (36.8 C)  TempSrc: Oral  SpO2: 100%  Weight: 194 lb (88 kg)  Height: 5\' 7"  (1.702 m)   Body mass index is 30.38 kg/m.  Advanced Directives 09/30/2020 09/29/2019 10/29/2018 09/26/2018 09/21/2017 09/21/2016  Does Patient Have a Medical Advance Directive? No No No No No No  Would patient like information on creating a medical advance directive? No - Patient declined No - Patient declined No - Patient declined No - Patient declined Yes (MAU/Ambulatory/Procedural Areas - Information given) No - Patient declined    Current Medications (verified) Outpatient Encounter Medications as of 10/05/2021  Medication Sig   aspirin EC 81 MG tablet Take 81 mg by mouth daily. Swallow whole.   busPIRone (BUSPAR) 7.5 MG tablet Take 1 tablet (7.5 mg total) by mouth 2 (two) times daily.   docusate sodium (COLACE) 50 MG capsule Take 50 mg by mouth every other day.   hydrocortisone 2.5 % lotion Apply topically as needed.    ibuprofen (ADVIL,MOTRIN) 200 MG tablet Take 200 mg by mouth every 6 (six) hours as needed.   loratadine (CLARITIN) 10 MG tablet Take 1 tablet by mouth daily as needed.    Multiple Vitamins-Minerals (EQ COMPLETE MULTIVITAMIN-ADULT PO) Take by mouth daily at 6 (six) AM.   naproxen sodium (ALEVE) 220 MG tablet Take 220 mg by mouth as needed.   pravastatin (PRAVACHOL) 40 MG tablet Take 1 tablet by mouth once daily   sertraline (ZOLOFT) 100 MG tablet Take 1 tablet (100 mg total) by mouth daily.   sildenafil (VIAGRA) 100 MG tablet Take 1 tablet by mouth daily as needed.   No facility-administered encounter medications on file as of 10/05/2021.    Allergies (verified) Prednisone   History: Past Medical History:  Diagnosis Date   Depression    High  cholesterol    Hypertension    Meniere disease    Ocular Migraines    Shingles 07/16/2015   of eye    Past Surgical History:  Procedure Laterality Date   COLONOSCOPY     COLONOSCOPY WITH PROPOFOL N/A 10/29/2018   Procedure: COLONOSCOPY WITH PROPOFOL;  Surgeon: Lucilla Lame, MD;  Location: [facility] ENDOSCOPY;  Service: Endoscopy;  Laterality: N/A;   SIGMOIDOSCOPY  2008   Dr. Jamal Collin. Normal per patient report   Family History  Adopted: Yes  Problem Relation Age of Onset   Cancer - Other Father    Stroke Son    Social History   Socioeconomic History   Marital status: Widowed    Spouse name: Not on file   Number of children: 2   Years of education: Not on file   Highest education level: Associate degree: occupational, Hotel manager, or vocational program  Occupational History   Occupation: Retired    Comment: Retired 2009  Tobacco Use   Smoking status: Former    Types: Cigarettes   Smokeless tobacco: Never   Tobacco comments:    quit over 30 years ago  Vaping Use   Vaping Use: Never used  Substance and Sexual Activity   Alcohol use: Yes    Alcohol/week: 0.0 standard drinks    Comment: rare wine   Drug use: No   Sexual activity: Not on file  Other Topics Concern   Not on file  Social History Narrative   Pt currently has a girlfriend who has had a heart attack recently. This has added to his stress level.    Social Determinants of Health   Financial Resource Strain: Not on file  Food Insecurity: Not on file  Transportation Needs: Not on file  Physical Activity: Not on file  Stress: Not on file  Social Connections: Not on file    Tobacco Counseling Counseling given: Not Answered Tobacco comments: quit over 30 years ago   Clinical Intake:  Pre-visit preparation completed: Yes  Pain : No/denies pain     Nutritional Risks: None Diabetes: No  How often do you need to have someone help you when you read instructions, pamphlets, or other written materials from  your doctor or pharmacy?: 1 - Never  Diabetic?no  Interpreter Needed?: No  Information entered by :: Kirke Shaggy, LPN   Activities of Daily Living In your present state of health, do you have any difficulty performing the following activities: 12/20/2020  Hearing? N  Vision? N  Difficulty concentrating or making decisions? Y  Walking or climbing stairs? Y  Dressing or bathing? N  Doing errands, shopping? N  Some recent data might be hidden    Patient Care Team: Birdie Sons, MD as PCP - General (Family Medicine) Lucilla Lame, MD as Consulting Physician (Gastroenterology) Anabel Bene, MD as Referring Physician (Neurology)  Indicate any recent Medical Services you may have received from other than [facility] providers in the past year (date may be approximate).     Assessment:   This is a routine wellness examination for Tell.  Hearing/Vision screen No results found.  Dietary issues and exercise activities discussed:     Goals Addressed   None    Depression Screen PHQ 2/9 Scores 12/20/2020 09/30/2020 06/21/2020 03/10/2020 02/04/2020 12/01/2019 09/29/2019  PHQ - 2 Score 2 2 3 1 5 5  0  PHQ- 9 Score 11 8 15 10 19 17  -    Fall Risk Fall Risk  12/20/2020 09/30/2020 06/21/2020 09/29/2019 09/26/2018  Falls in the past year? 1 1 1 1 1   Number falls in past yr: 0 1 0 1 1  Comment - - - - -  Injury with Fall? 0 0 0 0 0  Comment - - - - -  Risk for fall due to : History of fall(s);No Fall Risks Impaired balance/gait;Orthopedic patient;Impaired mobility - Impaired balance/gait -  Follow up Falls evaluation completed;Education provided;Falls prevention discussed Falls prevention discussed Falls evaluation completed Falls prevention discussed Falls prevention discussed    FALL RISK PREVENTION PERTAINING TO THE HOME:  Any stairs in or around the home? Yes  If so, are there any without handrails? No  Home free of loose throw rugs in walkways, pet beds, electrical cords, etc? Yes   Adequate lighting in your home to reduce risk of falls? Yes   ASSISTIVE DEVICES UTILIZED TO PREVENT FALLS:  Life alert? No  Use of a cane, walker or w/c? Yes  Grab bars in the bathroom? Yes  Shower chair or bench in shower? Yes  Elevated toilet seat or a handicapped toilet? No   TIMED UP AND GO:  Was the test performed? Yes .  Length of time to ambulate 10 feet: 4 sec.   Gait slow and steady without use of assistive device  Cognitive Function:        Immunizations Immunization History  Administered Date(s) Administered   [employer]  Quad(high Dose 65+) 06/21/2020   Influenza, High Dose Seasonal PF 07/19/2015, 08/03/2016, 06/28/2017, 06/12/2018   Influenza,inj,Quad PF,6+ Mos 07/28/2019   PFIZER(Purple Top)SARS-COV-2 Vaccination 11/07/2019, 12/03/2019, 08/09/2020   Pneumococcal Conjugate-13 08/24/2014   Pneumococcal Polysaccharide-23 05/01/2012   Td 09/26/2003   Tdap 07/19/2015    TDAP status: Up to date  Flu Vaccine status: Completed at today's visit  Pneumococcal vaccine status: Up to date  Covid-19 vaccine status: Completed vaccines  Qualifies for Shingles Vaccine? Yes   Zostavax completed No   Shingrix Completed?: No.    Education has been provided regarding the importance of this vaccine. Patient has been advised to call insurance company to determine out of pocket expense if they have not yet received this vaccine. Advised may also receive vaccine at local pharmacy or Health Dept. Verbalized acceptance and understanding.  Screening Tests Health Maintenance  Topic Date Due   Zoster Vaccines- Shingrix (1 of 2) Never done   COVID-19 Vaccine (4 - Booster for Pfizer series) 10/04/2020   INFLUENZA VACCINE  04/25/2021   COLONOSCOPY (Pts 45-64yrs Insurance coverage will need to be confirmed)  10/30/2023   TETANUS/TDAP  07/18/2025   Pneumonia Vaccine 29+ Years old  Completed   Hepatitis C Screening  Completed   HPV VACCINES  Aged Out    Health Maintenance  Health  Maintenance Due  Topic Date Due   Zoster Vaccines- Shingrix (1 of 2) Never done   COVID-19 Vaccine (4 - Booster for Pfizer series) 10/04/2020   INFLUENZA VACCINE  04/25/2021    Colorectal cancer screening: Type of screening: Colonoscopy. Completed 10/29/18. Repeat every 10 years  Lung Cancer Screening: (Low Dose CT Chest recommended if Age 43-80 years, 30 pack-year currently smoking OR have quit w/in 15years.) does not qualify.    Additional Screening:  Hepatitis C Screening: does qualify; Completed no  Vision Screening: Recommended annual ophthalmology exams for early detection of glaucoma and other disorders of the eye. Is the patient up to date with their annual eye exam?  Yes  Who is the provider or what is the name of the office in which the patient attends annual eye exams? none If pt is not established with a provider, would they like to be referred to a provider to establish care? No . declined  Dental Screening: Recommended annual dental exams for proper oral hygiene  Community Resource Referral / Chronic Care Management: CRR required this visit?  No   CCM required this visit?  No      Plan:     I have personally reviewed and noted the following in the patients chart:   Medical and social history Use of alcohol, tobacco or illicit drugs  Current medications and supplements including opioid prescriptions. Patient is not currently taking opioid prescriptions. Functional ability and status Nutritional status Physical activity Advanced directives List of other physicians Hospitalizations, surgeries, and ER visits in previous 12 months Vitals Screenings to include cognitive, depression, and falls Referrals and appointments  In addition, I have reviewed and discussed with patient certain preventive protocols, quality metrics, and best practice recommendations. A written personalized care plan for preventive services as well as general preventive health  recommendations were provided to patient.     Dionisio David, LPN   [id number]   Nurse Notes: none

## 2021-10-05 NOTE — Patient Instructions (Signed)
[name] , Thank you for taking time to come for your Medicare Wellness Visit. I appreciate your ongoing commitment to your health goals. Please review the following plan we discussed and let me know if I can assist you in the future.   Screening recommendations/referrals: Colonoscopy: 10/29/18 Recommended yearly ophthalmology/optometry visit for glaucoma screening and checkup Recommended yearly dental visit for hygiene and checkup  Vaccinations: Influenza vaccine: today Pneumococcal vaccine: 08/24/14 Tdap vaccine: 07/19/15 Shingles vaccine: n/d   Covid-19: 11/07/19, 12/03/19, 08/09/20  Advanced directives: no  Conditions/risks identified: none  Next appointment: Follow up in one year for your annual wellness visit.   Preventive Care 6 Years and Older, Male Preventive care refers to lifestyle choices and visits with your health care provider that can promote health and wellness. What does preventive care include? A yearly physical exam. This is also called an annual well check. Dental exams once or twice a year. Routine eye exams. Ask your health care provider how often you should have your eyes checked. Personal lifestyle choices, including: Daily care of your teeth and gums. Regular physical activity. Eating a healthy diet. Avoiding tobacco and drug use. Limiting alcohol use. Practicing safe sex. Taking low doses of aspirin every day. Taking vitamin and mineral supplements as recommended by your health care provider. What happens during an annual well check? The services and screenings done by your health care provider during your annual well check will depend on your age, overall health, lifestyle risk factors, and family history of disease. Counseling  Your health care provider may ask you questions about your: Alcohol use. Tobacco use. Drug use. Emotional well-being. Home and relationship well-being. Sexual activity. Eating habits. History of falls. Memory and  ability to understand (cognition). Work and work Statistician. Screening  You may have the following tests or measurements: Height, weight, and BMI. Blood pressure. Lipid and cholesterol levels. These may be checked every 5 years, or more frequently if you are over 64 years old. Skin check. Lung cancer screening. You may have this screening every year starting at age 41 if you have a 30-pack-year history of smoking and currently smoke or have quit within the past 15 years. Fecal occult blood test (FOBT) of the stool. You may have this test every year starting at age 87. Flexible sigmoidoscopy or colonoscopy. You may have a sigmoidoscopy every 5 years or a colonoscopy every 10 years starting at age 67. Prostate cancer screening. Recommendations will vary depending on your family history and other risks. Hepatitis C blood test. Hepatitis B blood test. Sexually transmitted disease (STD) testing. Diabetes screening. This is done by checking your blood sugar (glucose) after you have not eaten for a while (fasting). You may have this done every 1-3 years. Abdominal aortic aneurysm (AAA) screening. You may need this if you are a current or former smoker. Osteoporosis. You may be screened starting at age 15 if you are at high risk. Talk with your health care provider about your test results, treatment options, and if necessary, the need for more tests. Vaccines  Your health care provider may recommend certain vaccines, such as: Influenza vaccine. This is recommended every year. Tetanus, diphtheria, and acellular pertussis (Tdap, Td) vaccine. You may need a Td booster every 10 years. Zoster vaccine. You may need this after age 26. Pneumococcal 13-valent conjugate (PCV13) vaccine. One dose is recommended after age 24. Pneumococcal polysaccharide (PPSV23) vaccine. One dose is recommended after age 40. Talk to your health care provider about which screenings and  vaccines you need and how often you need  them. This information is not intended to replace advice given to you by your health care provider. Make sure you discuss any questions you have with your health care provider. Document Released: 10/08/2015 Document Revised: 05/31/2016 Document Reviewed: 07/13/2015 Elsevier Interactive Patient Education  2017 [employer] Prevention in the Home Falls can cause injuries. They can happen to people of all ages. There are many things you can do to make your home safe and to help prevent falls. What can I do on the outside of my home? Regularly fix the edges of walkways and driveways and fix any cracks. Remove anything that might make you trip as you walk through a door, such as a raised step or threshold. Trim any bushes or trees on the path to your home. Use bright outdoor lighting. Clear any walking paths of anything that might make someone trip, such as rocks or tools. Regularly check to see if handrails are loose or broken. Make sure that both sides of any steps have handrails. Any raised decks and porches should have guardrails on the edges. Have any leaves, snow, or ice cleared regularly. Use sand or salt on walking paths during winter. Clean up any spills in your garage right away. This includes oil or grease spills. What can I do in the bathroom? Use night lights. Install grab bars by the toilet and in the tub and shower. Do not use towel bars as grab bars. Use non-skid mats or decals in the tub or shower. If you need to sit down in the shower, use a plastic, non-slip stool. Keep the floor dry. Clean up any water that spills on the floor as soon as it happens. Remove soap buildup in the tub or shower regularly. Attach bath mats securely with double-sided non-slip rug tape. Do not have throw rugs and other things on the floor that can make you trip. What can I do in the bedroom? Use night lights. Make sure that you have a light by your bed that is easy to reach. Do not use  any sheets or blankets that are too big for your bed. They should not hang down onto the floor. Have a firm chair that has side arms. You can use this for support while you get dressed. Do not have throw rugs and other things on the floor that can make you trip. What can I do in the kitchen? Clean up any spills right away. Avoid walking on wet floors. Keep items that you use a lot in easy-to-reach places. If you need to reach something above you, use a strong step stool that has a grab bar. Keep electrical cords out of the way. Do not use floor polish or wax that makes floors slippery. If you must use wax, use non-skid floor wax. Do not have throw rugs and other things on the floor that can make you trip. What can I do with my stairs? Do not leave any items on the stairs. Make sure that there are handrails on both sides of the stairs and use them. Fix handrails that are broken or loose. Make sure that handrails are as long as the stairways. Check any carpeting to make sure that it is firmly attached to the stairs. Fix any carpet that is loose or worn. Avoid having throw rugs at the top or bottom of the stairs. If you do have throw rugs, attach them to the floor with carpet tape. Make  sure that you have a light switch at the top of the stairs and the bottom of the stairs. If you do not have them, ask someone to add them for you. What else can I do to help prevent falls? Wear shoes that: Do not have high heels. Have rubber bottoms. Are comfortable and fit you well. Are closed at the toe. Do not wear sandals. If you use a stepladder: Make sure that it is fully opened. Do not climb a closed stepladder. Make sure that both sides of the stepladder are locked into place. Ask someone to hold it for you, if possible. Clearly mark and make sure that you can see: Any grab bars or handrails. First and last steps. Where the edge of each step is. Use tools that help you move around (mobility aids)  if they are needed. These include: Canes. Walkers. Scooters. Crutches. Turn on the lights when you go into a dark area. Replace any light bulbs as soon as they burn out. Set up your furniture so you have a clear path. Avoid moving your furniture around. If any of your floors are uneven, fix them. If there are any pets around you, be aware of where they are. Review your medicines with your doctor. Some medicines can make you feel dizzy. This can increase your chance of falling. Ask your doctor what other things that you can do to help prevent falls. This information is not intended to replace advice given to you by your health care provider. Make sure you discuss any questions you have with your health care provider. Document Released: 07/08/2009 Document Revised: 02/17/2016 Document Reviewed: 10/16/2014 Elsevier Interactive Patient Education  2017 [employer].

## 2021-11-15 DIAGNOSIS — R202 Paresthesia of skin: Secondary | ICD-10-CM | POA: Diagnosis not present

## 2021-11-15 DIAGNOSIS — R200 Anesthesia of skin: Secondary | ICD-10-CM | POA: Diagnosis not present

## 2021-11-23 NOTE — Addendum Note (Signed)
Addended by: Dionisio David on: 11/23/2021 04:28 PM   Modules accepted: Orders

## 2022-01-05 DIAGNOSIS — R262 Difficulty in walking, not elsewhere classified: Secondary | ICD-10-CM | POA: Diagnosis not present

## 2022-01-05 DIAGNOSIS — R42 Dizziness and giddiness: Secondary | ICD-10-CM | POA: Diagnosis not present

## 2022-01-05 DIAGNOSIS — R296 Repeated falls: Secondary | ICD-10-CM | POA: Diagnosis not present

## 2022-01-05 DIAGNOSIS — R200 Anesthesia of skin: Secondary | ICD-10-CM | POA: Diagnosis not present

## 2022-01-05 DIAGNOSIS — F419 Anxiety disorder, unspecified: Secondary | ICD-10-CM | POA: Diagnosis not present

## 2022-01-05 DIAGNOSIS — R278 Other lack of coordination: Secondary | ICD-10-CM | POA: Diagnosis not present

## 2022-01-05 DIAGNOSIS — R413 Other amnesia: Secondary | ICD-10-CM | POA: Diagnosis not present

## 2022-01-05 DIAGNOSIS — H9193 Unspecified hearing loss, bilateral: Secondary | ICD-10-CM | POA: Diagnosis not present

## 2022-01-05 DIAGNOSIS — G9389 Other specified disorders of brain: Secondary | ICD-10-CM | POA: Diagnosis not present

## 2022-01-05 DIAGNOSIS — R202 Paresthesia of skin: Secondary | ICD-10-CM | POA: Diagnosis not present

## 2022-02-27 ENCOUNTER — Emergency Department
Admission: EM | Admit: 2022-02-27 | Discharge: 2022-02-27 | Disposition: A | Discharge status: Home / Self Care | Payer: PPO | Attending: Emergency Medicine | Admitting: Emergency Medicine

## 2022-02-27 ENCOUNTER — Emergency Department: Payer: PPO

## 2022-02-27 ENCOUNTER — Other Ambulatory Visit: Payer: Self-pay

## 2022-02-27 DIAGNOSIS — S0993XA Unspecified injury of face, initial encounter: Secondary | ICD-10-CM | POA: Diagnosis not present

## 2022-02-27 DIAGNOSIS — I10 Essential (primary) hypertension: Secondary | ICD-10-CM | POA: Diagnosis not present

## 2022-02-27 DIAGNOSIS — G319 Degenerative disease of nervous system, unspecified: Secondary | ICD-10-CM | POA: Diagnosis not present

## 2022-02-27 DIAGNOSIS — S0083XA Contusion of other part of head, initial encounter: Secondary | ICD-10-CM | POA: Insufficient documentation

## 2022-02-27 DIAGNOSIS — M542 Cervicalgia: Secondary | ICD-10-CM | POA: Diagnosis not present

## 2022-02-27 DIAGNOSIS — Y92481 Parking lot as the place of occurrence of the external cause: Secondary | ICD-10-CM | POA: Diagnosis not present

## 2022-02-27 DIAGNOSIS — S0990XA Unspecified injury of head, initial encounter: Secondary | ICD-10-CM | POA: Diagnosis not present

## 2022-02-27 DIAGNOSIS — W01198A Fall on same level from slipping, tripping and stumbling with subsequent striking against other object, initial encounter: Secondary | ICD-10-CM | POA: Insufficient documentation

## 2022-02-27 DIAGNOSIS — R040 Epistaxis: Secondary | ICD-10-CM | POA: Diagnosis not present

## 2022-02-27 LAB — CBC WITH DIFFERENTIAL/PLATELET
Abs Immature Granulocytes: 0.03 10*3/uL (ref 0.00–0.07)
Basophils Absolute: 0 10*3/uL (ref 0.0–0.1)
Basophils Relative: 1 %
Eosinophils Absolute: 0.2 10*3/uL (ref 0.0–0.5)
Eosinophils Relative: 2 %
HCT: 46 % (ref 39.0–52.0)
Hemoglobin: 15.2 g/dL (ref 13.0–17.0)
Immature Granulocytes: 0 %
Lymphocytes Relative: 11 %
Lymphs Abs: 0.8 10*3/uL (ref 0.7–4.0)
MCH: 29.1 pg (ref 26.0–34.0)
MCHC: 33 g/dL (ref 30.0–36.0)
MCV: 88.1 fL (ref 80.0–100.0)
Monocytes Absolute: 0.5 10*3/uL (ref 0.1–1.0)
Monocytes Relative: 6 %
Neutro Abs: 6.3 10*3/uL (ref 1.7–7.7)
Neutrophils Relative %: 80 %
Platelets: 215 10*3/uL (ref 150–400)
RBC: 5.22 MIL/uL (ref 4.22–5.81)
RDW: 12.5 % (ref 11.5–15.5)
WBC: 7.8 10*3/uL (ref 4.0–10.5)
nRBC: 0 % (ref 0.0–0.2)

## 2022-02-27 LAB — CBC
HCT: 46 % (ref 39.0–52.0)
Hemoglobin: 15.4 g/dL (ref 13.0–17.0)
MCH: 29.2 pg (ref 26.0–34.0)
MCHC: 33.5 g/dL (ref 30.0–36.0)
MCV: 87.1 fL (ref 80.0–100.0)
Platelets: 222 10*3/uL (ref 150–400)
RBC: 5.28 MIL/uL (ref 4.22–5.81)
RDW: 12.5 % (ref 11.5–15.5)
WBC: 14.3 10*3/uL — ABNORMAL HIGH (ref 4.0–10.5)
nRBC: 0 % (ref 0.0–0.2)

## 2022-02-27 LAB — BASIC METABOLIC PANEL
Anion gap: 6 (ref 5–15)
BUN: 27 mg/dL — ABNORMAL HIGH (ref 8–23)
CO2: 27 mmol/L (ref 22–32)
Calcium: 8.8 mg/dL — ABNORMAL LOW (ref 8.9–10.3)
Chloride: 108 mmol/L (ref 98–111)
Creatinine, Ser: 1.04 mg/dL (ref 0.61–1.24)
GFR, Estimated: >60 mL/min (ref >60)
Glucose, Bld: 110 mg/dL — ABNORMAL HIGH (ref 70–99)
Potassium: 4.3 mmol/L (ref 3.5–5.1)
Sodium: 141 mmol/L (ref 135–145)

## 2022-02-27 LAB — PROTIME-INR
INR: 1.1 (ref 0.8–1.2)
Prothrombin Time: 13.6 seconds (ref 11.4–15.2)

## 2022-02-27 MED ORDER — OXYMETAZOLINE HCL 0.05 % NA SOLN
1.0000 | Freq: Once | NASAL | Status: AC
  Administered 2022-02-27: 1 via NASAL
  Filled 2022-02-27: qty 30

## 2022-02-27 MED ORDER — TRANEXAMIC ACID FOR EPISTAXIS
500.0000 mg | Freq: Once | TOPICAL | Status: AC
  Administered 2022-02-27: 500 mg via TOPICAL
  Filled 2022-02-27: qty 10

## 2022-02-27 NOTE — ED Provider Notes (Signed)
.  Epistaxis Management  Date/Time: 02/27/2022 6:33 PM Performed by: Carrie Mew, MD Authorized by: Carrie Mew, MD   Consent:    Consent obtained:  Verbal   Consent given by:  Patient   Risks, benefits, and alternatives were discussed: yes     Risks discussed:  Bleeding and pain   Alternatives discussed:  Referral Universal protocol:    Patient identity confirmed:  Arm band Anesthesia:    Anesthesia method:  None Procedure details:    Treatment site:  Unable to specify   Repair method: afrin + atomized topical tranexamic acid.   Treatment complexity:  Limited   Treatment episode: recurring   Post-procedure details:    Assessment:  Bleeding stopped   Procedure completion:  Tolerated with difficulty     ----------------------------------------- 3:33 PM on 02/27/2022 ----------------------------------------- Pt comes to ED due to persistent nosebleed after merocel packing of R anterior nare.  He had an episode of large volume emesis of black liquid, c/w swallowed blood from hematemesis.  Pt reports he feels better after vomiting.    Merocel sponge removed, found to be only about 3cm long and not able to provide much effective packing.  After having pt blow his nose and remove a large amount of clots, afrin spray and TXA atomized topically were instilled in b/l nares.  Nasal clamp placed for 20 minutes and then removed, and no visible / persistent bleeding.  Doubt UGIB.   ----------------------------------------- 6:39 PM on 02/27/2022 ----------------------------------------- Still no recurrent bleeding.  Tolerating PO. Awaiting repeat CBC  ----------------------------------------- 7:04 PM on 02/27/2022 ----------------------------------------- Repeat CBC stable.  Hemoglobin 15.  On exam, still no signs of bleeding, no blood in oropharynx.  Feels well, not requiring admission and can be discharged home.  Final diagnoses:  Epistaxis  Right-sided epistaxis         Carrie Mew, MD 02/27/22 1904

## 2022-02-27 NOTE — ED Provider Notes (Signed)
[facility] Provider Note    Event Date/Time   First MD Initiated Contact with Patient 02/27/22 1401     (approximate)   History   Epistaxis   HPI  Cameron King is a 76 y.o. male with history of hypertension, Mnire's disease, high cholesterol and ocular migraines presents emergency department with a nosebleed.  Patient had a fall 2 weeks ago here at the hospital when he fell in the parking lot and hit his face on the bumper of the car.  Patient's had bleeding on and off since the fall.  States today it got much worse and he went to urgent care.  Urgent care packed his nose on the right side.  Patient states then he started having bleeding on the left.  States she does feel it running down the back of his throat.      Physical Exam   Triage Vital Signs: ED Triage Vitals  Enc Vitals Group     BP 02/27/22 1322 135/87     Pulse Rate 02/27/22 1322 83     Resp 02/27/22 1322 18     Temp 02/27/22 1322 98 F (36.7 C)     Temp src --      SpO2 02/27/22 1322 100 %     Weight --      Height --      Head Circumference --      Peak Flow --      Pain Score 02/27/22 1321 4     Pain Loc --      Pain Edu? --      Excl. in [place]? --     Most recent vital signs: Vitals:   02/27/22 1647 02/27/22 1700  BP: 126/76 (!) 126/92  Pulse: (!) 58 61  Resp: 18 18  Temp:    SpO2: 99% 98%     General: Awake, no distress.   CV:  Good peripheral perfusion. regular rate and  rhythm Resp:  Normal effort.  Abd:  No distention.   Other:  Nasal packing intact on the right side although there is blood oozing from the packing, left nostril has no active bleeding noted at this time   ED Results / Procedures / Treatments   Labs (all labs ordered are listed, but only abnormal results are displayed) Labs Reviewed  BASIC METABOLIC PANEL - Abnormal; Notable for the following components:      Result Value   Glucose, Bld 110 (*)    BUN 27 (*)    Calcium 8.8 (*)    All  other components within normal limits  CBC WITH DIFFERENTIAL/PLATELET  PROTIME-INR  CBC     EKG     RADIOLOGY CT of the head, maxillofacial and C-spine    PROCEDURES:   Procedures   MEDICATIONS ORDERED IN ED: Medications  oxymetazoline (AFRIN) 0.05 % nasal spray 1 spray (1 spray Each Nare Given 02/27/22 1455)  tranexamic acid (CYKLOKAPRON) 1000 MG/10ML topical solution 500 mg (500 mg Topical Given by Other 02/27/22 1620)     IMPRESSION / MDM / ASSESSMENT AND PLAN / ED COURSE  I reviewed the triage vital signs and the nursing notes.                              Differential diagnosis includes, but is not limited to, nasal fracture, septal hematoma, epistaxis, subdural, SAH, C-spine fracture  Patient's presentation is most consistent with acute  illness / injury with system symptoms.   Due to the continued bleeding from the nose, did order Afrin.  Patient states that he did not use any nasal spray prior to inserting the packing at the urgent care.  Basic labs ordered, CT of the head, C-spine, maxillofacial ordered due to patient's recent fall.  Does have bruising on the forehead.  CT of the head, C-spine, and maxillofacial are interpreted by me as being normal.   The patient continues to have bleeding even after the Afrin.  We will try TXA.  We will place him into a nasal packing on the right side and inserts to the left side  Dr Joni Fears in to see, see his note for procedure Will await a second cbc as patient did vomit blood   Care transferred to Dr Joni Fears       FINAL CLINICAL IMPRESSION(S) / ED DIAGNOSES   Final diagnoses:  Epistaxis     Rx / DC Orders   ED Discharge Orders     None        Note:  This document was prepared using Dragon voice recognition software and may include unintentional dictation errors.    Versie Starks, PA-C 02/27/22 Joesph Fillers, MD 02/28/22 [phone]

## 2022-02-27 NOTE — ED Triage Notes (Signed)
Pt come with c/o nose bleed. Pt did have fall this past weekend here at hospital outside.pt did not get evaluated. Pt on daily aspirin. Pt states it had stopped bleeding on the left nostril but then started on right. Pt did go to UC and they placed plug in. Bleeding has continued. Nose clamp placed at this time. Bleeding is somewhat controlled.   Pt denies any other symptoms.

## 2022-04-06 DIAGNOSIS — R42 Dizziness and giddiness: Secondary | ICD-10-CM | POA: Diagnosis not present

## 2022-04-06 DIAGNOSIS — R202 Paresthesia of skin: Secondary | ICD-10-CM | POA: Diagnosis not present

## 2022-04-06 DIAGNOSIS — R413 Other amnesia: Secondary | ICD-10-CM | POA: Diagnosis not present

## 2022-04-06 DIAGNOSIS — H9193 Unspecified hearing loss, bilateral: Secondary | ICD-10-CM | POA: Diagnosis not present

## 2022-04-06 DIAGNOSIS — R262 Difficulty in walking, not elsewhere classified: Secondary | ICD-10-CM | POA: Diagnosis not present

## 2022-04-06 DIAGNOSIS — R200 Anesthesia of skin: Secondary | ICD-10-CM | POA: Diagnosis not present

## 2022-04-06 DIAGNOSIS — R278 Other lack of coordination: Secondary | ICD-10-CM | POA: Diagnosis not present

## 2022-04-06 DIAGNOSIS — R296 Repeated falls: Secondary | ICD-10-CM | POA: Diagnosis not present

## 2022-04-06 DIAGNOSIS — G9389 Other specified disorders of brain: Secondary | ICD-10-CM | POA: Diagnosis not present

## 2022-04-19 ENCOUNTER — Other Ambulatory Visit: Payer: Self-pay

## 2022-04-19 DIAGNOSIS — F411 Generalized anxiety disorder: Secondary | ICD-10-CM

## 2022-04-19 DIAGNOSIS — F32A Depression, unspecified: Secondary | ICD-10-CM

## 2022-04-19 MED ORDER — SERTRALINE HCL 100 MG PO TABS: 100.0000 mg | ORAL_TABLET | Freq: Every day | ORAL | 0 refills | Status: DC

## 2022-04-20 ENCOUNTER — Other Ambulatory Visit: Payer: Self-pay | Admitting: Family Medicine

## 2022-04-20 DIAGNOSIS — F32A Depression, unspecified: Secondary | ICD-10-CM

## 2022-04-20 DIAGNOSIS — F411 Generalized anxiety disorder: Secondary | ICD-10-CM

## 2022-04-20 MED ORDER — SERTRALINE HCL 100 MG PO TABS: 100.0000 mg | ORAL_TABLET | Freq: Every day | ORAL | 0 refills | Status: DC

## 2022-05-10 ENCOUNTER — Other Ambulatory Visit: Payer: Self-pay | Admitting: Family Medicine

## 2022-05-10 DIAGNOSIS — F411 Generalized anxiety disorder: Secondary | ICD-10-CM

## 2022-05-10 DIAGNOSIS — F32A Depression, unspecified: Secondary | ICD-10-CM

## 2022-05-10 NOTE — Telephone Encounter (Signed)
Requested medication (s) are due for refill today: yes  Requested medication (s) are on the active medication list: yes  Last refill:  04/20/22 #30 0 refills  Future visit scheduled: yes in 3 weeks  Notes to clinic:  do you want to give courtesy refill for #30 or 3 weeks of medication?     Requested Prescriptions  Pending Prescriptions Disp Refills   sertraline (ZOLOFT) 100 MG tablet [Pharmacy Med Name: Sertraline HCl 100 MG Oral Tablet] 30 tablet 0    Sig: Take 1 tablet by mouth once daily     Psychiatry:  Antidepressants - SSRI - sertraline Failed - 05/10/2022  9:20 AM      Failed - AST in normal range and within 360 days    AST  Date Value Ref Range Status  11/10/2020 25 0 - 40 IU/L Final         Failed - ALT in normal range and within 360 days    ALT  Date Value Ref Range Status  11/10/2020 29 0 - 44 IU/L Final         Failed - Valid encounter within last 6 months    Recent Outpatient Visits           1 year ago Annual physical exam   [facility] Birdie Sons, MD   1 year ago Numbness and tingling of both feet   [facility] Birdie Sons, MD   1 year ago Chronic pain of left ankle   [facility] Birdie Sons, MD   1 year ago Acute left ankle pain   [place], Vickki Muff, PA-C   1 year ago GAD (generalized anxiety disorder)   [place], Kirstie Peri, MD       Future Appointments             In 3 weeks Fisher, Kirstie Peri, MD [facility]            Passed - Completed PHQ-2 or PHQ-9 in the last 360 days

## 2022-06-01 NOTE — Progress Notes (Signed)
I,Roshena L Chambers,acting as a scribe for Lelon Huh, MD.,have documented all relevant documentation on the behalf of Lelon Huh, MD,as directed by  Lelon Huh, MD while in the presence of Lelon Huh, MD.    Established patient visit   Patient: Cameron King   DOB: 04/27/1946   76 y.o. Male  MRN: [id number] Visit Date: 06/02/2022  Today's healthcare provider: Lelon Huh, MD   Chief Complaint  Patient presents with   Depression   Subjective    HPI  Depression and anxiety, Follow-up  He  was last seen for this more than 1 year ago.   Changes made at last visit include none; continue same medication.   He reports good compliance with treatment. He is not having side effects.   He reports good tolerance of treatment. Current symptoms include: depressed mood He feels he is Unchanged since last visit.     06/02/2022   10:58 AM 10/05/2021   11:11 AM 12/20/2020    9:16 AM  Depression screen PHQ 2/9  Decreased Interest 0 1 1  Down, Depressed, Hopeless 0 1 1  PHQ - 2 Score 0 2 2  Altered sleeping 0 1 2  Tired, decreased energy 0 1 2  Change in appetite 1 0 0  Feeling bad or failure about yourself  1 0 1  Trouble concentrating '2 1 1  '$ Moving slowly or fidgety/restless 1 0 2  Suicidal thoughts 1 0 1  PHQ-9 Score '6 5 11  '$ Difficult doing work/chores Somewhat difficult Somewhat difficult Somewhat difficult    -----------------------------------------------------------------------------------------   Medications: Outpatient Medications Prior to Visit  Medication Sig   aspirin EC 81 MG tablet Take 81 mg by mouth daily. Swallow whole.   busPIRone (BUSPAR) 10 MG tablet Take 1 tablet by mouth 2 (two) times daily.   docusate sodium (COLACE) 50 MG capsule Take 50 mg by mouth every other day.   gabapentin (NEURONTIN) 400 MG capsule TAKE 400 mg twice a day for 1 week, then 400 mg three time a day for 1 week, then 400 mg four times per day.   hydrocortisone 2.5 %  lotion Apply topically as needed.    ibuprofen (ADVIL,MOTRIN) 200 MG tablet Take 200 mg by mouth every 6 (six) hours as needed.   loratadine (CLARITIN) 10 MG tablet Take 1 tablet by mouth daily as needed.   Multiple Vitamins-Minerals (EQ COMPLETE MULTIVITAMIN-ADULT PO) Take by mouth daily at 6 (six) AM.   naproxen sodium (ALEVE) 220 MG tablet Take 220 mg by mouth as needed.   sertraline (ZOLOFT) 100 MG tablet Take 1 tablet by mouth once daily   sildenafil (VIAGRA) 100 MG tablet Take 1 tablet by mouth daily as needed.   [DISCONTINUED] gabapentin (NEURONTIN) 100 MG capsule    [DISCONTINUED] gabapentin (NEURONTIN) 300 MG capsule 300 mg three times daily, or 300 mg in the morning and 600 mg at night.   pravastatin (PRAVACHOL) 40 MG tablet Take 1 tablet by mouth once daily (Patient not taking: Reported on 06/02/2022)   No facility-administered medications prior to visit.    Review of Systems  Constitutional:  Negative for appetite change, chills and fever.  Respiratory:  Negative for chest tightness, shortness of breath and wheezing.   Cardiovascular:  Negative for chest pain and palpitations.  Gastrointestinal:  Negative for abdominal pain, nausea and vomiting.       Objective    BP 127/80 (BP Location: Right Arm, Patient Position: Sitting, Cuff Size: Large)  Pulse 78   Temp 97.6 F (36.4 C) (Oral)   Resp 14   Wt 190 lb (86.2 kg)   SpO2 100% Comment: room air  BMI 29.76 kg/m    Physical Exam   General: Appearance:    Well developed, well nourished male in no acute distress  Eyes:    PERRL, conjunctiva/corneas clear, EOM's intact       Lungs:     Clear to auscultation bilaterally, respirations unlabored  Heart:    Normal heart rate. Normal rhythm. No murmurs, rubs, or gallops.    MS:   All extremities are intact.    Neurologic:   Awake, alert, oriented x 3. No apparent focal neurological defect.         Assessment & Plan     1. GAD (generalized anxiety disorder) Doing well  on current medications. Continue gabapentin prescribed by Dr. Manuella Ghazi Refill sertraline (ZOLOFT) 100 MG tablet; Take 1 tablet (100 mg total) by mouth daily.  Dispense: 90 tablet; Refill: 3  2. Depressive disorder refill sertraline (ZOLOFT) 100 MG tablet; Take 1 tablet (100 mg total) by mouth daily.  Dispense: 90 tablet; Refill: 3  3. Pure hypercholesterolemia Is no longer taking pravastatin.    - Lipid panel  4. Need for influenza vaccination  - Flu Vaccine QUAD High Dose IM (Fluad)      The entirety of the information documented in the History of Present Illness, Review of Systems and Physical Exam were personally obtained by me. Portions of this information were initially documented by the CMA and reviewed by me for thoroughness and accuracy.     Lelon Huh, MD  [facility] [phone] (phone) [phone] (fax)  [place]

## 2022-06-02 ENCOUNTER — Ambulatory Visit (INDEPENDENT_AMBULATORY_CARE_PROVIDER_SITE_OTHER): Payer: PPO | Admitting: Family Medicine

## 2022-06-02 ENCOUNTER — Encounter: Payer: Self-pay | Admitting: Family Medicine

## 2022-06-02 VITALS — BP 127/80 | HR 78 | Temp 97.6°F | Resp 14 | Wt 190.0 lb

## 2022-06-02 DIAGNOSIS — E7800 Pure hypercholesterolemia, unspecified: Secondary | ICD-10-CM | POA: Diagnosis not present

## 2022-06-02 DIAGNOSIS — Z23 Encounter for immunization: Secondary | ICD-10-CM | POA: Diagnosis not present

## 2022-06-02 DIAGNOSIS — F411 Generalized anxiety disorder: Secondary | ICD-10-CM | POA: Diagnosis not present

## 2022-06-02 DIAGNOSIS — F32A Depression, unspecified: Secondary | ICD-10-CM | POA: Diagnosis not present

## 2022-06-02 MED ORDER — SERTRALINE HCL 100 MG PO TABS: 100.0000 mg | ORAL_TABLET | Freq: Every day | ORAL | 3 refills | Status: AC

## 2022-06-02 NOTE — Patient Instructions (Signed)
Please review the attached list of medications and notify my office if there are any errors.   Please go to the lab draw station in [address] on the second floor of [facility]  when you are fasting for 8 hours. Normal hours are 8:00am to 11:30am and 1:00pm to 4:00pm Monday through Friday     

## 2022-06-05 DIAGNOSIS — E7800 Pure hypercholesterolemia, unspecified: Secondary | ICD-10-CM | POA: Diagnosis not present

## 2022-06-06 ENCOUNTER — Other Ambulatory Visit: Payer: Self-pay | Admitting: Family Medicine

## 2022-06-06 DIAGNOSIS — E7800 Pure hypercholesterolemia, unspecified: Secondary | ICD-10-CM

## 2022-06-06 LAB — LIPID PANEL
Chol/HDL Ratio: 5.4 ratio — ABNORMAL HIGH (ref 0.0–5.0)
Cholesterol, Total: 177 mg/dL (ref 100–199)
HDL: 33 mg/dL — ABNORMAL LOW (ref >39)
LDL Chol Calc (NIH): 122 mg/dL — ABNORMAL HIGH (ref 0–99)
Triglycerides: 119 mg/dL (ref 0–149)
VLDL Cholesterol Cal: 22 mg/dL (ref 5–40)

## 2022-06-06 MED ORDER — PRAVASTATIN SODIUM 40 MG PO TABS: 40.0000 mg | ORAL_TABLET | Freq: Every day | ORAL | 2 refills | Status: DC

## 2022-06-07 DIAGNOSIS — R262 Difficulty in walking, not elsewhere classified: Secondary | ICD-10-CM | POA: Diagnosis not present

## 2022-06-07 DIAGNOSIS — R278 Other lack of coordination: Secondary | ICD-10-CM | POA: Diagnosis not present

## 2022-06-07 DIAGNOSIS — G9389 Other specified disorders of brain: Secondary | ICD-10-CM | POA: Diagnosis not present

## 2022-06-07 DIAGNOSIS — R296 Repeated falls: Secondary | ICD-10-CM | POA: Diagnosis not present

## 2022-06-07 DIAGNOSIS — F419 Anxiety disorder, unspecified: Secondary | ICD-10-CM | POA: Diagnosis not present

## 2022-06-07 DIAGNOSIS — R42 Dizziness and giddiness: Secondary | ICD-10-CM | POA: Diagnosis not present

## 2022-06-07 DIAGNOSIS — R413 Other amnesia: Secondary | ICD-10-CM | POA: Diagnosis not present

## 2022-06-07 DIAGNOSIS — R202 Paresthesia of skin: Secondary | ICD-10-CM | POA: Diagnosis not present

## 2022-06-07 DIAGNOSIS — R200 Anesthesia of skin: Secondary | ICD-10-CM | POA: Diagnosis not present

## 2022-08-10 DIAGNOSIS — R413 Other amnesia: Secondary | ICD-10-CM | POA: Diagnosis not present

## 2022-08-10 DIAGNOSIS — R278 Other lack of coordination: Secondary | ICD-10-CM | POA: Diagnosis not present

## 2022-08-10 DIAGNOSIS — R296 Repeated falls: Secondary | ICD-10-CM | POA: Diagnosis not present

## 2022-08-10 DIAGNOSIS — R4701 Aphasia: Secondary | ICD-10-CM | POA: Diagnosis not present

## 2022-08-10 DIAGNOSIS — R202 Paresthesia of skin: Secondary | ICD-10-CM | POA: Diagnosis not present

## 2022-08-10 DIAGNOSIS — R32 Unspecified urinary incontinence: Secondary | ICD-10-CM | POA: Diagnosis not present

## 2022-08-10 DIAGNOSIS — R42 Dizziness and giddiness: Secondary | ICD-10-CM | POA: Diagnosis not present

## 2022-08-10 DIAGNOSIS — F419 Anxiety disorder, unspecified: Secondary | ICD-10-CM | POA: Diagnosis not present

## 2022-08-10 DIAGNOSIS — H9193 Unspecified hearing loss, bilateral: Secondary | ICD-10-CM | POA: Diagnosis not present

## 2022-08-10 DIAGNOSIS — R200 Anesthesia of skin: Secondary | ICD-10-CM | POA: Diagnosis not present

## 2022-08-10 DIAGNOSIS — R262 Difficulty in walking, not elsewhere classified: Secondary | ICD-10-CM | POA: Diagnosis not present

## 2022-08-10 DIAGNOSIS — G9389 Other specified disorders of brain: Secondary | ICD-10-CM | POA: Diagnosis not present

## 2022-10-09 ENCOUNTER — Ambulatory Visit (INDEPENDENT_AMBULATORY_CARE_PROVIDER_SITE_OTHER): Payer: PPO

## 2022-10-09 VITALS — BP 148/78 | Ht 67.0 in | Wt 200.2 lb

## 2022-10-09 DIAGNOSIS — Z0000 Encounter for general adult medical examination without abnormal findings: Secondary | ICD-10-CM | POA: Diagnosis not present

## 2022-10-09 NOTE — Progress Notes (Signed)
Subjective:   Cameron King is a 77 y.o. male who presents for Medicare Annual/Subsequent preventive examination.  Review of Systems     Cardiac Risk Factors include: advanced age (>32mn, >>41women);hypertension;male gender     Objective:    Today's Vitals   10/09/22 1036  BP: (!) 148/78  Weight: 200 lb 3.2 oz (90.8 kg)  Height: '5\' 7"'$  (1.702 m)  PainSc: 4    Body mass index is 31.36 kg/m.     10/09/2022   10:48 AM 02/27/2022    1:21 PM 10/05/2021   11:16 AM 09/30/2020   10:40 AM 09/29/2019   10:58 AM 10/29/2018    7:22 AM 09/26/2018   10:43 AM  Advanced Directives  Does Patient Have a Medical Advance Directive? No No No No No No No  Would patient like information on creating a medical advance directive? No - Patient declined  No - Patient declined No - Patient declined No - Patient declined No - Patient declined No - Patient declined    Current Medications (verified) Outpatient Encounter Medications as of 10/09/2022  Medication Sig   aspirin EC 81 MG tablet Take 81 mg by mouth daily. Swallow whole.   busPIRone (BUSPAR) 15 MG tablet Take 15 mg by mouth 2 (two) times daily.   docusate sodium (COLACE) 50 MG capsule Take 50 mg by mouth every other day.   gabapentin (NEURONTIN) 400 MG capsule TAKE 400 mg twice a day for 1 week, then 400 mg three time a day for 1 week, then 400 mg four times per day.   hydrocortisone 2.5 % lotion Apply topically as needed.    ibuprofen (ADVIL,MOTRIN) 200 MG tablet Take 200 mg by mouth every 6 (six) hours as needed.   Multiple Vitamins-Minerals (EQ COMPLETE MULTIVITAMIN-ADULT PO) Take by mouth daily at 6 (six) AM.   naproxen sodium (ALEVE) 220 MG tablet Take 220 mg by mouth as needed.   pravastatin (PRAVACHOL) 40 MG tablet Take 1 tablet (40 mg total) by mouth daily.   sertraline (ZOLOFT) 100 MG tablet Take 1 tablet (100 mg total) by mouth daily.   sildenafil (VIAGRA) 100 MG tablet Take 1 tablet by mouth daily as needed.   loratadine (CLARITIN) 10  MG tablet Take 1 tablet by mouth daily as needed. (Patient not taking: Reported on 10/09/2022)   No facility-administered encounter medications on file as of 10/09/2022.    Allergies (verified) Prednisone   History: Past Medical History:  Diagnosis Date   Depression    High cholesterol    Hypertension    Meniere disease    Ocular Migraines    Shingles 07/16/2015   of eye    Past Surgical History:  Procedure Laterality Date   COLONOSCOPY     COLONOSCOPY WITH PROPOFOL N/A 10/29/2018   Procedure: COLONOSCOPY WITH PROPOFOL;  Surgeon: WLucilla Lame MD;  [facility]  Service: Endoscopy;  Laterality: N/A;   SIGMOIDOSCOPY  2008   Dr. SJamal Collin Normal per patient report   Family History  Adopted: Yes  Problem Relation Age of Onset   Cancer - Other Father    Stroke Son    Social History   Socioeconomic History   Marital status: Widowed    Spouse name: Not on file   Number of children: 2   Years of education: Not on file   Highest education level: Associate degree: occupational, tHotel manager or vocational program  Occupational History   Occupation: Retired    Comment: Retired 2009  Tobacco  Use   Smoking status: Former    Types: Cigarettes   Smokeless tobacco: Never   Tobacco comments:    quit over 30 years ago  Vaping Use   Vaping Use: Never used  Substance and Sexual Activity   Alcohol use: Yes    Alcohol/week: 0.0 standard drinks of alcohol    Comment: rare wine   Drug use: No   Sexual activity: Not on file  Other Topics Concern   Not on file  Social History Narrative   Pt currently has a girlfriend who has had a heart attack recently. This has added to his stress level.    Social Determinants of Health   Financial Resource Strain: Low Risk  (10/09/2022)   Overall Financial Resource Strain (CARDIA)    Difficulty of Paying Living Expenses: Not very hard  Food Insecurity: No Food Insecurity (10/09/2022)   Hunger Vital Sign    Worried About Running Out of  Food in the Last Year: Never true    Ran Out of Food in the Last Year: Never true  Transportation Needs: No Transportation Needs (10/09/2022)   PRAPARE - Hydrologist (Medical): No    Lack of Transportation (Non-Medical): No  Physical Activity: Insufficiently Active (10/09/2022)   Exercise Vital Sign    Days of Exercise per Week: 2 days    Minutes of Exercise per Session: 20 min  Stress: No Stress Concern Present (10/09/2022)   [place]    Feeling of Stress : Only a little  Social Connections: Moderately Isolated (10/09/2022)   Social Connection and Isolation Panel [NHANES]    Frequency of Communication with Friends and Family: More than three times a week    Frequency of Social Gatherings with Friends and Family: Once a week    Attends Religious Services: More than 4 times per year    Active Member of [employer] or Organizations: No    Attends Archivist Meetings: Never    Marital Status: Widowed    Tobacco Counseling Counseling given: Not Answered Tobacco comments: quit over 30 years ago   Clinical Intake:  Pre-visit preparation completed: Yes  Pain : 0-10 Pain Score: 4  Pain Type: Chronic pain Pain Location: Shoulder Pain Orientation: Right     Nutritional Risks: None Diabetes: No  How often do you need to have someone help you when you read instructions, pamphlets, or other written materials from your doctor or pharmacy?: 1 - Never  Diabetic?no  Interpreter Needed?: No  Information entered by :: Kirke Shaggy, LPN   Activities of Daily Living    10/09/2022   10:50 AM 06/02/2022   10:57 AM  In your present state of health, do you have any difficulty performing the following activities:  Hearing? 0 0  Vision? 0 0  Difficulty concentrating or making decisions? 0 1  Walking or climbing stairs? 1 1  Dressing or bathing? 0 0  Doing errands, shopping? 0 0   Preparing Food and eating ? N   Using the Toilet? N   In the past six months, have you accidently leaked urine? N   Do you have problems with loss of bowel control? N   Managing your Medications? N   Managing your Finances? N   Housekeeping or managing your Housekeeping? N     Patient Care Team: Birdie Sons, MD as PCP - General (Family Medicine) Lucilla Lame, MD as Consulting Physician (Gastroenterology) Gurney Maxin  E, MD as Referring Physician (Neurology)  Indicate any recent Medical Services you may have received from other than [facility] providers in the past year (date may be approximate).     Assessment:   This is a routine wellness examination for Oluwanifemi.  Hearing/Vision screen Hearing Screening - Comments:: No aids Vision Screening - Comments:: Readers-  Dietary issues and exercise activities discussed: Current Exercise Habits: Home exercise routine, Type of exercise: walking, Time (Minutes): 20, Frequency (Times/Week): 2, Weekly Exercise (Minutes/Week): 40   Goals Addressed             This Visit's Progress    DIET - INCREASE WATER INTAKE         Depression Screen    10/09/2022   10:46 AM 06/02/2022   10:58 AM 10/05/2021   11:11 AM 12/20/2020    9:16 AM 09/30/2020   10:32 AM 06/21/2020    9:16 AM 03/10/2020    8:56 AM  PHQ 2/9 Scores  PHQ - 2 Score 2 0 '2 2 2 3 1  '$ PHQ- 9 Score  '6 5 11 8 15 10    '$ Fall Risk    10/09/2022   10:49 AM 06/02/2022   10:55 AM 10/05/2021   11:17 AM 12/20/2020    9:16 AM 09/30/2020   10:41 AM  Fall Risk   Falls in the past year? '1 1 1 1 1  '$ Number falls in past yr: 1 0 1 0 1  Injury with Fall? 0 0 0 0 0  Risk for fall due to : History of fall(s) No Fall Risks History of fall(s) History of fall(s);No Fall Risks Impaired balance/gait;Orthopedic patient;Impaired mobility  Follow up Falls evaluation completed;Falls prevention discussed Falls evaluation completed Falls prevention discussed Falls evaluation completed;Education  provided;Falls prevention discussed Falls prevention discussed    FALL RISK PREVENTION PERTAINING TO THE HOME:  Any stairs in or around the home? Yes  If so, are there any without handrails? No  Home free of loose throw rugs in walkways, pet beds, electrical cords, etc? Yes  Adequate lighting in your home to reduce risk of falls? Yes   ASSISTIVE DEVICES UTILIZED TO PREVENT FALLS:  Life alert? No  Use of a cane, walker or w/c? Yes  Grab bars in the bathroom? Yes  Shower chair or bench in shower? Yes  Elevated toilet seat or a handicapped toilet? No   TIMED UP AND GO:  Was the test performed? Yes .  Length of time to ambulate 10 feet: 4 sec.   Gait steady and fast without use of assistive device  Cognitive Function:        10/09/2022   10:54 AM  6CIT Screen  What Year? 4 points  What month? 3 points  What time? 0 points  Count back from 20 0 points  Months in reverse 4 points  Repeat phrase 4 points  Total Score 15 points    Immunizations Immunization History  Administered Date(s) Administered   Fluad Quad(high Dose 65+) 06/21/2020, 10/05/2021, 06/02/2022   Influenza, High Dose Seasonal PF 07/19/2015, 08/03/2016, 06/28/2017, 06/12/2018   Influenza,inj,Quad PF,6+ Mos 07/28/2019   PFIZER(Purple Top)SARS-COV-2 Vaccination 11/07/2019, 12/03/2019, 08/09/2020   Pneumococcal Conjugate-13 08/24/2014   Pneumococcal Polysaccharide-23 05/01/2012   Td 09/26/2003   Tdap 07/19/2015    TDAP status: Up to date  Flu Vaccine status: Up to date  Pneumococcal vaccine status: Up to date  Covid-19 vaccine status: Completed vaccines  Qualifies for Shingles Vaccine? Yes   Zostavax completed Yes  Shingrix Completed?: No.    Education has been provided regarding the importance of this vaccine. Patient has been advised to call insurance company to determine out of pocket expense if they have not yet received this vaccine. Advised may also receive vaccine at local pharmacy or Health  Dept. Verbalized acceptance and understanding.  Screening Tests Health Maintenance  Topic Date Due   Zoster Vaccines- Shingrix (1 of 2) Never done   COVID-19 Vaccine (4 - 2023-24 season) 05/26/2022   Medicare Annual Wellness (AWV)  10/10/2023   COLONOSCOPY (Pts 45-20yr Insurance coverage will need to be confirmed)  10/30/2023   DTaP/Tdap/Td (3 - Td or Tdap) 07/18/2025   Pneumonia Vaccine 76 Years old  Completed   INFLUENZA VACCINE  Completed   Hepatitis C Screening  Completed   HPV VACCINES  Aged Out    Health Maintenance  Health Maintenance Due  Topic Date Due   Zoster Vaccines- Shingrix (1 of 2) Never done   COVID-19 Vaccine (4 - 2023-24 season) 05/26/2022    Colorectal cancer screening: Type of screening: Colonoscopy. Completed 10/29/18. Repeat every 5 years  Lung Cancer Screening: (Low Dose CT Chest recommended if Age 76-80years, 30 pack-year currently smoking OR have quit w/in 15years.) does not qualify.   Additional Screening:  Hepatitis C Screening: does qualify; Completed 05/01/12  Vision Screening: Recommended annual ophthalmology exams for early detection of glaucoma and other disorders of the eye. Is the patient up to date with their annual eye exam?  No  Who is the provider or what is the name of the office in which the patient attends annual eye exams? No one If pt is not established with a provider, would they like to be referred to a provider to establish care? No .   Dental Screening: Recommended annual dental exams for proper oral hygiene  Community Resource Referral / Chronic Care Management: CRR required this visit?  No   CCM required this visit?  No      Plan:     I have personally reviewed and noted the following in the patient's chart:   Medical and social history Use of alcohol, tobacco or illicit drugs  Current medications and supplements including opioid prescriptions. Patient is not currently taking opioid prescriptions. Functional  ability and status Nutritional status Physical activity Advanced directives List of other physicians Hospitalizations, surgeries, and ER visits in previous 12 months Vitals Screenings to include cognitive, depression, and falls Referrals and appointments  In addition, I have reviewed and discussed with patient certain preventive protocols, quality metrics, and best practice recommendations. A written personalized care plan for preventive services as well as general preventive health recommendations were provided to patient.     LDionisio David LPN   [id number]  Nurse Notes: none

## 2022-10-09 NOTE — Patient Instructions (Signed)
Cameron King , Thank you for taking time to come for your Medicare Wellness Visit. I appreciate your ongoing commitment to your health goals. Please review the following plan we discussed and let me know if I can assist you in the future.   Screening recommendations/referrals: Colonoscopy: 10/29/18 Recommended yearly ophthalmology/optometry visit for glaucoma screening and checkup Recommended yearly dental visit for hygiene and checkup  Vaccinations: Influenza vaccine: 06/02/22 Pneumococcal vaccine: 08/24/14 Tdap vaccine: 07/19/15 Shingles vaccine: Zostavax 02/24/15   Covid-19: 11/07/19, 12/03/19, 08/09/20  Advanced directives: no  Conditions/risks identified: none  Next appointment: Follow up in one year for your annual wellness visit. 10/15/23 @ 9:45 am by phone  Preventive Care 76 Years and Older, Male Preventive care refers to lifestyle choices and visits with your health care provider that can promote health and wellness. What does preventive care include? A yearly physical exam. This is also called an annual well check. Dental exams once or twice a year. Routine eye exams. Ask your health care provider how often you should have your eyes checked. Personal lifestyle choices, including: Daily care of your teeth and gums. Regular physical activity. Eating a healthy diet. Avoiding tobacco and drug use. Limiting alcohol use. Practicing safe sex. Taking low doses of aspirin every day. Taking vitamin and mineral supplements as recommended by your health care provider. What happens during an annual well check? The services and screenings done by your health care provider during your annual well check will depend on your age, overall health, lifestyle risk factors, and family history of disease. Counseling  Your health care provider may ask you questions about your: Alcohol use. Tobacco use. Drug use. Emotional well-being. Home and relationship well-being. Sexual activity. Eating  habits. History of falls. Memory and ability to understand (cognition). Work and work Statistician. Screening  You may have the following tests or measurements: Height, weight, and BMI. Blood pressure. Lipid and cholesterol levels. These may be checked every 5 years, or more frequently if you are over 66 years old. Skin check. Lung cancer screening. You may have this screening every year starting at age 76 if you have a 30-pack-year history of smoking and currently smoke or have quit within the past 15 years. Fecal occult blood test (FOBT) of the stool. You may have this test every year starting at age 76. Flexible sigmoidoscopy or colonoscopy. You may have a sigmoidoscopy every 5 years or a colonoscopy every 10 years starting at age 76. Prostate cancer screening. Recommendations will vary depending on your family history and other risks. Hepatitis C blood test. Hepatitis B blood test. Sexually transmitted disease (STD) testing. Diabetes screening. This is done by checking your blood sugar (glucose) after you have not eaten for a while (fasting). You may have this done every 1-3 years. Abdominal aortic aneurysm (AAA) screening. You may need this if you are a current or former smoker. Osteoporosis. You may be screened starting at age 76 if you are at high risk. Talk with your health care provider about your test results, treatment options, and if necessary, the need for more tests. Vaccines  Your health care provider may recommend certain vaccines, such as: Influenza vaccine. This is recommended every year. Tetanus, diphtheria, and acellular pertussis (Tdap, Td) vaccine. You may need a Td booster every 10 years. Zoster vaccine. You may need this after age 76. Pneumococcal 13-valent conjugate (PCV13) vaccine. One dose is recommended after age 76. Pneumococcal polysaccharide (PPSV23) vaccine. One dose is recommended after age 76. Talk to your health  care provider about which screenings and  vaccines you need and how often you need them. This information is not intended to replace advice given to you by your health care provider. Make sure you discuss any questions you have with your health care provider. Document Released: 10/08/2015 Document Revised: 05/31/2016 Document Reviewed: 07/13/2015 Elsevier Interactive Patient Education  2017 [place] Prevention in the Home Falls can cause injuries. They can happen to people of all ages. There are many things you can do to make your home safe and to help prevent falls. What can I do on the outside of my home? Regularly fix the edges of walkways and driveways and fix any cracks. Remove anything that might make you trip as you walk through a door, such as a raised step or threshold. Trim any bushes or trees on the path to your home. Use bright outdoor lighting. Clear any walking paths of anything that might make someone trip, such as rocks or tools. Regularly check to see if handrails are loose or broken. Make sure that both sides of any steps have handrails. Any raised decks and porches should have guardrails on the edges. Have any leaves, snow, or ice cleared regularly. Use sand or salt on walking paths during winter. Clean up any spills in your garage right away. This includes oil or grease spills. What can I do in the bathroom? Use night lights. Install grab bars by the toilet and in the tub and shower. Do not use towel bars as grab bars. Use non-skid mats or decals in the tub or shower. If you need to sit down in the shower, use a plastic, non-slip stool. Keep the floor dry. Clean up any water that spills on the floor as soon as it happens. Remove soap buildup in the tub or shower regularly. Attach bath mats securely with double-sided non-slip rug tape. Do not have throw rugs and other things on the floor that can make you trip. What can I do in the bedroom? Use night lights. Make sure that you have a light by your  bed that is easy to reach. Do not use any sheets or blankets that are too big for your bed. They should not hang down onto the floor. Have a firm chair that has side arms. You can use this for support while you get dressed. Do not have throw rugs and other things on the floor that can make you trip. What can I do in the kitchen? Clean up any spills right away. Avoid walking on wet floors. Keep items that you use a lot in easy-to-reach places. If you need to reach something above you, use a strong step stool that has a grab bar. Keep electrical cords out of the way. Do not use floor polish or wax that makes floors slippery. If you must use wax, use non-skid floor wax. Do not have throw rugs and other things on the floor that can make you trip. What can I do with my stairs? Do not leave any items on the stairs. Make sure that there are handrails on both sides of the stairs and use them. Fix handrails that are broken or loose. Make sure that handrails are as long as the stairways. Check any carpeting to make sure that it is firmly attached to the stairs. Fix any carpet that is loose or worn. Avoid having throw rugs at the top or bottom of the stairs. If you do have throw rugs, attach them to  the floor with carpet tape. Make sure that you have a light switch at the top of the stairs and the bottom of the stairs. If you do not have them, ask someone to add them for you. What else can I do to help prevent falls? Wear shoes that: Do not have high heels. Have rubber bottoms. Are comfortable and fit you well. Are closed at the toe. Do not wear sandals. If you use a stepladder: Make sure that it is fully opened. Do not climb a closed stepladder. Make sure that both sides of the stepladder are locked into place. Ask someone to hold it for you, if possible. Clearly mark and make sure that you can see: Any grab bars or handrails. First and last steps. Where the edge of each step is. Use tools that  help you move around (mobility aids) if they are needed. These include: Canes. Walkers. Scooters. Crutches. Turn on the lights when you go into a dark area. Replace any light bulbs as soon as they burn out. Set up your furniture so you have a clear path. Avoid moving your furniture around. If any of your floors are uneven, fix them. If there are any pets around you, be aware of where they are. Review your medicines with your doctor. Some medicines can make you feel dizzy. This can increase your chance of falling. Ask your doctor what other things that you can do to help prevent falls. This information is not intended to replace advice given to you by your health care provider. Make sure you discuss any questions you have with your health care provider. Document Released: 07/08/2009 Document Revised: 02/17/2016 Document Reviewed: 10/16/2014 Elsevier Interactive Patient Education  2017 [employer].

## 2022-10-12 DIAGNOSIS — R42 Dizziness and giddiness: Secondary | ICD-10-CM | POA: Diagnosis not present

## 2022-10-12 DIAGNOSIS — R4701 Aphasia: Secondary | ICD-10-CM | POA: Diagnosis not present

## 2022-10-12 DIAGNOSIS — R262 Difficulty in walking, not elsewhere classified: Secondary | ICD-10-CM | POA: Diagnosis not present

## 2022-10-12 DIAGNOSIS — R202 Paresthesia of skin: Secondary | ICD-10-CM | POA: Diagnosis not present

## 2022-10-12 DIAGNOSIS — R32 Unspecified urinary incontinence: Secondary | ICD-10-CM | POA: Diagnosis not present

## 2022-10-12 DIAGNOSIS — H9193 Unspecified hearing loss, bilateral: Secondary | ICD-10-CM | POA: Diagnosis not present

## 2022-10-12 DIAGNOSIS — F419 Anxiety disorder, unspecified: Secondary | ICD-10-CM | POA: Diagnosis not present

## 2022-10-12 DIAGNOSIS — G9389 Other specified disorders of brain: Secondary | ICD-10-CM | POA: Diagnosis not present

## 2022-10-12 DIAGNOSIS — R296 Repeated falls: Secondary | ICD-10-CM | POA: Diagnosis not present

## 2022-10-12 DIAGNOSIS — R200 Anesthesia of skin: Secondary | ICD-10-CM | POA: Diagnosis not present

## 2022-12-11 DIAGNOSIS — R278 Other lack of coordination: Secondary | ICD-10-CM | POA: Diagnosis not present

## 2022-12-11 DIAGNOSIS — R296 Repeated falls: Secondary | ICD-10-CM | POA: Diagnosis not present

## 2022-12-11 DIAGNOSIS — R202 Paresthesia of skin: Secondary | ICD-10-CM | POA: Diagnosis not present

## 2022-12-11 DIAGNOSIS — R32 Unspecified urinary incontinence: Secondary | ICD-10-CM | POA: Diagnosis not present

## 2022-12-11 DIAGNOSIS — R4701 Aphasia: Secondary | ICD-10-CM | POA: Diagnosis not present

## 2022-12-11 DIAGNOSIS — R262 Difficulty in walking, not elsewhere classified: Secondary | ICD-10-CM | POA: Diagnosis not present

## 2022-12-11 DIAGNOSIS — R413 Other amnesia: Secondary | ICD-10-CM | POA: Diagnosis not present

## 2022-12-11 DIAGNOSIS — R42 Dizziness and giddiness: Secondary | ICD-10-CM | POA: Diagnosis not present

## 2022-12-11 DIAGNOSIS — G9389 Other specified disorders of brain: Secondary | ICD-10-CM | POA: Diagnosis not present

## 2022-12-11 DIAGNOSIS — R200 Anesthesia of skin: Secondary | ICD-10-CM | POA: Diagnosis not present

## 2022-12-11 DIAGNOSIS — F419 Anxiety disorder, unspecified: Secondary | ICD-10-CM | POA: Diagnosis not present

## 2023-01-01 ENCOUNTER — Encounter: Payer: Self-pay | Admitting: Family Medicine

## 2023-01-01 DIAGNOSIS — R200 Anesthesia of skin: Secondary | ICD-10-CM

## 2023-02-21 ENCOUNTER — Ambulatory Visit: Payer: PPO | Admitting: Podiatry

## 2023-02-21 ENCOUNTER — Encounter: Payer: Self-pay | Admitting: Podiatry

## 2023-02-21 DIAGNOSIS — R2689 Other abnormalities of gait and mobility: Secondary | ICD-10-CM

## 2023-02-21 DIAGNOSIS — R200 Anesthesia of skin: Secondary | ICD-10-CM

## 2023-02-21 DIAGNOSIS — Z9181 History of falling: Secondary | ICD-10-CM

## 2023-02-21 DIAGNOSIS — R269 Unspecified abnormalities of gait and mobility: Secondary | ICD-10-CM

## 2023-02-21 DIAGNOSIS — R202 Paresthesia of skin: Secondary | ICD-10-CM | POA: Diagnosis not present

## 2023-02-21 NOTE — Progress Notes (Signed)
He presents today with his daughter-in-law for follow-up of his feet he has been in neurology has gait issues was told that he had dementia has fallen at least 7 times in the past few months and they are wanting to know if there are any recommendations as far as shoe gear.  His third toe on his left foot does have a thick nail which they were concerned about.  Objective: Vital signs are stable alert and oriented x 3.  Patient is getting harder to communicate with due to his dementia which is quite obvious.  He has marginal stability as he is ambulating.  No reproducible pain on palpation to the feet other than around the second metatarsal phalangeal joint is mildly tender.  He also has thick toenail to the third digit left foot which appears to be dystrophic and that there is no other signs of fungus or bacterial infection surrounding it.  Assessment: Dementia loss of balance gait instability nail dystrophy.  Plan: At this point were going to send him for gait and balance training with physical therapy.  Like to follow-up with him on an as-needed basis.

## 2023-02-27 ENCOUNTER — Other Ambulatory Visit: Payer: Self-pay | Admitting: Family Medicine

## 2023-02-27 DIAGNOSIS — E7800 Pure hypercholesterolemia, unspecified: Secondary | ICD-10-CM

## 2023-02-27 NOTE — Telephone Encounter (Signed)
Patient will need to schedule an office visit for further refills. Requested Prescriptions  Pending Prescriptions Disp Refills   pravastatin (PRAVACHOL) 40 MG tablet [Pharmacy Med Name: Pravastatin Sodium 40 MG Oral Tablet] 90 tablet 0    Sig: Take 1 tablet by mouth once daily     Cardiovascular:  Antilipid - Statins Failed - 02/27/2023  2:37 PM      Failed - Lipid Panel in normal range within the last 12 months    Cholesterol, Total  Date Value Ref Range Status  06/05/2022 177 100 - 199 mg/dL Final   LDL Chol Calc (NIH)  Date Value Ref Range Status  06/05/2022 122 (H) 0 - 99 mg/dL Final   HDL  Date Value Ref Range Status  06/05/2022 33 (L) >39 mg/dL Final   Triglycerides  Date Value Ref Range Status  06/05/2022 119 0 - 149 mg/dL Final         Passed - Patient is not pregnant      Passed - Valid encounter within last 12 months    Recent Outpatient Visits           9 months ago GAD (generalized anxiety disorder)   [place] [facility] Malva [facility]imes, [facility]   2 years ago Annual physical exam   [place] [facility] Malva Limes, MD   2 years ago Numbness and tingling of both feet   [place] [facility] Malva Limes, MD   2 years ago Chronic pain of left ankle   [facility] Malva Limes, MD   2 years ago Acute left ankle pain   [facility] Chrismon, Jodell Cipro, [place]

## 2023-03-07 ENCOUNTER — Other Ambulatory Visit: Payer: Self-pay | Admitting: Family Medicine

## 2023-03-07 DIAGNOSIS — E7800 Pure hypercholesterolemia, unspecified: Secondary | ICD-10-CM

## 2023-03-08 NOTE — Telephone Encounter (Signed)
Requested by interface surescripts. Receipt confirmed by pharmacy 02/27/23 at 4:57 pm. Called pharmacy and staff reports patient has not picked up Rx yet but was computer generated to send additional request. Duplicate.  Requested Prescriptions  Refused Prescriptions Disp Refills   pravastatin (PRAVACHOL) 40 MG tablet [Pharmacy Med Name: Pravastatin Sodium 40 MG Oral Tablet] 90 tablet 0    Sig: Take 1 tablet by mouth once daily     Cardiovascular:  Antilipid - Statins Failed - 03/07/2023 12:33 PM      Failed - Lipid Panel in normal range within the last 12 months    Cholesterol, Total  Date Value Ref Range Status  06/05/2022 177 100 - 199 mg/dL Final   LDL Chol Calc (NIH)  Date Value Ref Range Status  06/05/2022 122 (H) 0 - 99 mg/dL Final   HDL  Date Value Ref Range Status  06/05/2022 33 (L) >39 mg/dL Final   Triglycerides  Date Value Ref Range Status  06/05/2022 119 0 - 149 mg/dL Final         Passed - Patient is not pregnant      Passed - Valid encounter within last 12 months    Recent Outpatient Visits           9 months ago GAD (generalized anxiety disorder)   [place] [facility] Malva Limes, MD   2 years ago Annual physical exam   [place] [facility] Malva Limes, MD   2 years ago Numbness and tingling of both feet   [place] [facility] Malva Limes, MD   2 years ago Chronic pain of left ankle   [facility] Malva Limes, MD   2 years ago Acute left ankle pain   [facility] Chrismon, Jodell Cipro, [place]

## 2023-03-08 NOTE — Telephone Encounter (Signed)
Called pharmacy to verify request. Receipt confirmed by pharmacy 02/27/23 at 4:57 pm. Pharmacy staff reports patient has not picked up Rx at this time . Computer generated refill request was sent

## 2023-03-21 ENCOUNTER — Encounter: Payer: Self-pay | Admitting: Podiatry

## 2023-04-19 ENCOUNTER — Encounter: Payer: Self-pay | Admitting: Physical Therapy

## 2023-04-19 ENCOUNTER — Ambulatory Visit: Payer: PPO | Attending: Podiatry | Admitting: Physical Therapy

## 2023-04-19 ENCOUNTER — Other Ambulatory Visit: Payer: Self-pay

## 2023-04-19 VITALS — BP 116/72 | HR 76

## 2023-04-19 DIAGNOSIS — R202 Paresthesia of skin: Secondary | ICD-10-CM | POA: Diagnosis not present

## 2023-04-19 DIAGNOSIS — M6281 Muscle weakness (generalized): Secondary | ICD-10-CM | POA: Insufficient documentation

## 2023-04-19 DIAGNOSIS — R2689 Other abnormalities of gait and mobility: Secondary | ICD-10-CM | POA: Diagnosis not present

## 2023-04-19 DIAGNOSIS — R200 Anesthesia of skin: Secondary | ICD-10-CM | POA: Diagnosis not present

## 2023-04-19 DIAGNOSIS — R2681 Unsteadiness on feet: Secondary | ICD-10-CM | POA: Insufficient documentation

## 2023-04-19 DIAGNOSIS — Z9181 History of falling: Secondary | ICD-10-CM | POA: Diagnosis not present

## 2023-04-19 DIAGNOSIS — R269 Unspecified abnormalities of gait and mobility: Secondary | ICD-10-CM | POA: Diagnosis not present

## 2023-04-19 NOTE — Therapy (Signed)
OUTPATIENT PHYSICAL THERAPY NEURO EVALUATION   Patient Name: Cameron King MRN: [id number] DOB:1945/10/22, 77 y.o., male Today's Date: 04/19/2023   PCP: Malva Limes, MD REFERRING PROVIDER: Elinor Parkinson, DPM  END OF SESSION:   04/19/23 1411  PT Visits / Re-Eval  Visit Number 1  Number of Visits 9 (8 + eval)  Date for PT Re-Evaluation 06/22/23 (pushed out due to scheduling delay)  Authorization  Authorization Type [employer]  PT Time Calculation  PT Start Time 1401  PT Stop Time 1455  PT Time Calculation (min) 54 min  PT - End of Session  Equipment Utilized During Treatment Gait belt  Activity Tolerance Patient tolerated treatment well  Behavior During Therapy [facility] for tasks assessed/performed;Restless   Past Medical History:  Diagnosis Date   Depression    High cholesterol    Hypertension    Meniere disease    Ocular Migraines    Shingles 07/16/2015   of eye    Past Surgical History:  Procedure Laterality Date   COLONOSCOPY     COLONOSCOPY WITH PROPOFOL N/A 10/29/2018   Procedure: COLONOSCOPY WITH PROPOFOL;  Surgeon: Midge Minium, MD;  Location: [facility] ENDOSCOPY;  Service: Endoscopy;  Laterality: N/A;   SIGMOIDOSCOPY  2008   Dr. Evette Cristal. Normal per patient report   Patient Active Problem List   Diagnosis Date Noted   Anxiety, mild 10/04/2021   Difficulty walking 10/04/2021   Hearing deficit, bilateral 10/04/2021   Numbness and tingling 10/04/2021   Sensory ataxia 10/04/2021   Numbness and tingling of both feet 02/08/2021   GAD (generalized anxiety disorder) 02/04/2020   Confusion 02/04/2020   History of colonic polyps    Chronic pain of left ankle 10/07/2018   Bilateral hearing loss 08/21/2018   Cerebral ventriculomegaly 08/21/2018   Chronic pain of right knee 10/02/2016   Enthesopathy of hip 08/02/2009   Rotator cuff syndrome 10/04/2008   Chronic infection of sinus 03/30/2008   HLD (hyperlipidemia) 04/09/2007   Former smoker, stopped  smoking in distant past 11/23/2005   Allergic rhinitis 08/23/2005   Depressive disorder 08/23/2005   Impotence of organic origin 08/23/2005   Migraine without status migrainosus 08/23/2005    ONSET DATE: a couple years ago  REFERRING DIAG: R20.0,R20.2 (ICD-10-CM) - Numbness and tingling of both feet R26.9 (ICD-10-CM) - Abnormality of gait R26.89 (ICD-10-CM) - Balance problem Z91.81 (ICD-10-CM) - At high risk for falls  THERAPY DIAG:  Anesthesia of skin  History of falling  Unsteadiness on feet  Muscle weakness (generalized)  Other abnormalities of gait and mobility  Rationale for Evaluation and Treatment: Rehabilitation  SUBJECTIVE:  SUBJECTIVE STATEMENT: He has had neuropathy for a couple years and as of just before May 2024 he was falling more frequently.  He is also having trouble getting off the floor and out of the tub.  He now lives with his son and Environmental health practitioner. Pt accompanied by: self and family member-daughter-in-law [name]  PERTINENT HISTORY: Depression, cerebral ventriculomegaly, HLD, dementia, bilateral hearing loss, GAD, neuropathy  PAIN:  Are you having pain? No  PRECAUTIONS: Fall  RED FLAGS: None - pt has urge incontinence  WEIGHT BEARING RESTRICTIONS: No  FALLS: Has patient fallen in last 6 months? Yes. 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  Daughter-in-law endorses move to her  home is long-term.  OBJECTIVE:   DIAGNOSTIC FINDINGS: No recent relevant imaging.  COGNITION: Overall cognitive status: History of cognitive impairments - at baseline-pt has STM deficits, increased processing time, and delayed recall related to his dementia   SENSATION: Light touch: WFL and N/T from ankle line down into toes  COORDINATION: LE RAMS:  WFL Heel-to-shin:  WFL bilaterally  EDEMA:  None noted in BLE  MUSCLE TONE: None noted in BLE, no clonus present bilaterally  POSTURE: forward head and weight shift right  LOWER EXTREMITY ROM:     Active  Right Eval Left Eval  Hip flexion Grossly WFL  Hip extension   Hip abduction   Hip adduction   Hip internal rotation   Hip external rotation   Knee flexion   Knee extension   Ankle dorsiflexion   Ankle plantarflexion   Ankle inversion    Ankle eversion     (Blank rows = not tested)  LOWER EXTREMITY MMT:    MMT Right Eval Left Eval  Hip flexion    Hip extension    Hip abduction    Hip adduction    Hip internal rotation    Hip external rotation    Knee flexion    Knee extension    Ankle dorsiflexion    Ankle plantarflexion    Ankle inversion    Ankle eversion    (Blank rows = not tested)  BED MOBILITY:  Sit to supine Complete Independence Supine to sit Complete Independence Rolling to Right Complete Independence Rolling to Left Complete Independence  TRANSFERS: Assistive device utilized: Quad cane small base  Sit to stand: SBA and CGA Stand to sit: SBA and CGA Chair to chair: SBA and CGA Floor:  varies based on patient cognition and positional preference (pt prefers posterior bump vs quadruped crawl)  GAIT: Gait pattern: step to pattern, step through pattern, decreased arm swing- Right, decreased arm swing- Left, decreased stride length, shuffling, and narrow BOS Distance walked: various clinic distances Assistive device utilized: Quad cane small base Level of assistance: SBA and CGA Comments:  Patient demonstrates progressive decrease in step size, but not like that of a festination pattern.  He tends to carry the cane vs using it creating trip hazard.  PT attempted to cue patient for gait pattern at end of session w/ inconsistent response to intervention.  Did discuss with patient and daughter the cognitive load of using a cane and sometimes creating a  fall risk due to these difficulties, but did not discourage trying to utilize safe gait pattern with NBQC option at home until further assessment and gait training can be completed.  FUNCTIONAL TESTS:  5 times sit to stand: 21.90 seconds w/ BUE support, pt requires repeat simple commands Timed up and go (TUG): To be assessed. 10 meter walk test: To be assessed. Berg Balance Scale: To be assessed.  PATIENT SURVEYS:  None completed due to cognitive demand.  TODAY'S TREATMENT:                                                                                                                              DATE: 04/19/2023  See education below (gait training initiated today to be lumped into TA due to emphasis on safety component to prevent tripping over cane and promoting actual use of the cane vs carrying).   PATIENT EDUCATION: Education details: PT POC, assessment used today and assessments to be completed next session, and goals to be set around assessments and deficits noted.  Provided education on various AD options (discussed trialing and assessing for carryover and safety before purchasing new option) and contexts of using different ones vs same device all the time, cognitive load of using cane, urge incontinence and getting urinal from PCP (daughter-in-law to call office), printed shower grab bar options that can be used for standing from toilet and entering/exiting bathtub if patient is not using shower bench consistently (endorsed as safest option, but pt still attempts to take baths and sometimes has bench in tub the wrong way), provided  information regarding insertable bed rails for use with bed mobility and steady standing at bedside in preparation to ambulate to bathroom or to perform certain aspects of dressing, discussed night lights and clearing pathways for fall prevention. Person educated: Patient and Caregiver daughter-in-law Arline Asp Education method: Explanation and Handouts Education comprehension: verbalized understanding and needs further education  HOME EXERCISE PROGRAM: To be established.  GOALS: Goals reviewed with patient? Yes  SHORT TERM GOALS: Target date: 05/18/2023  Patient and caregiver to be able to verbalize fall prevention strategies in order to promote safety in home environment. Baseline:  Initiate conversation at eval w/ handout to be provided. Goal status: INITIAL  2.  Patient to perform floor recovery using quadruped method vs posterior bump at no more than modA level in order to improve management at home in the event of a fall. Baseline: Assist varies, pt prefers posterior bump method. Goal status: INITIAL  3.  Pt will ambulate >/=250 feet over level surfaces with proper sequencing of appropriate LRAD and no more than SBA level of assist to promote improved independence with household and community access. Baseline: Various clinic distances w/ mostly CGA due to improper use of cane Goal status: INITIAL  4.  Pt will decrease 5xSTS to </= 17.90 seconds in order to demonstrate decreased risk for falls and improved functional bilateral LE strength and power. Baseline: 21.90 seconds  w/ BUE support Goal status: INITIAL  LONG TERM GOALS: Target date: 06/15/2023  Pt will be independent and compliant with BLE strengthening and balance focused HEP in order to maintain functional progress and improve mobility. Baseline: To be established. Goal status: INITIAL  2.  Patient will be compliant to formal walking program >/= 4 days per week w/ supervision from family to improve aerobic tolerance and  ambulatory mechanics w/ appropriate AD. Baseline: To be established. Goal status: INITIAL  3.  Pt will decrease 5xSTS to </= 13.90 seconds in order to demonstrate decreased risk for falls and improved functional bilateral LE strength and power. Baseline: 21.90 seconds w/ BUE support Goal status: INITIAL  4.  TUG to be assessed with LTG set as appropriate. Baseline: To be assessed. Goal status: INITIAL  5.  to be assessed with LTG set as appropriate. Baseline: To be assessed. Goal status: INITIAL  6.  BERG to be assessed with LTG set as appropriate. Baseline: To be assessed. Goal status: INITIAL  ASSESSMENT:  CLINICAL IMPRESSION: Patient is a 77 y.o. male who was seen today for physical therapy evaluation and treatment for frequent falls and imbalance secondary to neuropathy.  Pt has a significant PMH of depression, cerebral ventriculomegaly, HLD, dementia, bilateral hearing loss, GAD, and neuropathy.  Identified impairments include repeated falls, impaired STM, delayed recall, and increased processing time at baseline, bilateral N/T from ankles to toes, gait instability with shuffling pattern and decreased arm swing.  He need further evaluation of use of NBQC that he currently has as he does not demonstrate safe use or sequencing almost creating an obstacle instead of mobility aide for himself.  Evaluation via the following assessment tools: 5xSTS indicates elevate fall risk compared to age-related normative values.  TUG, , and BERG to be assessed at next session to further assess instability and fall risk.  He would benefit from skilled PT to address impairments as noted and progress towards long term goals.  OBJECTIVE IMPAIRMENTS: Abnormal gait, decreased activity tolerance, decreased balance, decreased cognition, difficulty walking, impaired sensation, improper body mechanics, and postural dysfunction.   ACTIVITY LIMITATIONS: carrying, lifting, bending, standing, squatting,  stairs, transfers, reach over head, and locomotion level  PARTICIPATION LIMITATIONS: meal prep, cleaning, laundry, driving, shopping, and community activity  PERSONAL FACTORS: Age, Fitness, Past/current experiences, Time since onset of injury/illness/exacerbation, and 1-2 comorbidities: dementia, neuropathy  are also affecting patient's functional outcome.   REHAB POTENTIAL: Fair See PMH and personal factors  CLINICAL DECISION MAKING: Evolving/moderate complexity  EVALUATION COMPLEXITY: Moderate  PLAN:  PT FREQUENCY: 1x/week  PT DURATION: 8 weeks  PLANNED INTERVENTIONS: Therapeutic exercises, Therapeutic activity, Neuromuscular re-education, Balance training, Gait training, Patient/Family education, Self Care, Stair training, Vestibular training, DME instructions, and Re-evaluation  PLAN FOR NEXT SESSION: ASSESS TUG, , and BERG-set LTGs!  Fall prevention handout.  Initiate HEP for bilateral LE strength and balance.  Initiate supervised walking program.  Gait training w/ NBQC (pt's personal cane) vs RW vs none?   Sadie Haber, PT, DPT 04/19/2023, 3:05 PM

## 2023-04-24 ENCOUNTER — Ambulatory Visit: Payer: PPO | Admitting: Physical Therapy

## 2023-04-24 ENCOUNTER — Encounter: Payer: Self-pay | Admitting: Physical Therapy

## 2023-04-24 DIAGNOSIS — M6281 Muscle weakness (generalized): Secondary | ICD-10-CM

## 2023-04-24 DIAGNOSIS — R2681 Unsteadiness on feet: Secondary | ICD-10-CM

## 2023-04-24 DIAGNOSIS — R2689 Other abnormalities of gait and mobility: Secondary | ICD-10-CM

## 2023-04-24 DIAGNOSIS — Z9181 History of falling: Secondary | ICD-10-CM

## 2023-04-24 DIAGNOSIS — R200 Anesthesia of skin: Secondary | ICD-10-CM

## 2023-04-24 NOTE — Therapy (Unsigned)
OUTPATIENT PHYSICAL THERAPY NEURO TREATMENT   Patient Name: Cameron King MRN: [id number] DOB:1945/11/26, 77 y.o., male Today's Date: 04/24/2023   PCP: Malva Limes, MD REFERRING PROVIDER: Elinor Parkinson, [place]  END OF SESSION:   PT End of Session - 04/24/23 1500     Visit Number 2    Number of Visits 9   8 + eval   Date for PT Re-Evaluation 06/22/23   pushed out due to scheduling delay   Authorization Type [employer]    PT Start Time 1454   PT ran over with pt prior.   PT Stop Time 1541    PT Time Calculation (min) 47 min    Equipment Utilized During Treatment Gait belt    Activity Tolerance Patient tolerated treatment well    Behavior During Therapy WFL for tasks assessed/performed;Restless            Past Medical History:  Diagnosis Date   Depression    High cholesterol    Hypertension    Meniere disease    Ocular Migraines    Shingles 07/16/2015   of eye    Past Surgical History:  Procedure Laterality Date   COLONOSCOPY     COLONOSCOPY WITH PROPOFOL N/A 10/29/2018   Procedure: COLONOSCOPY WITH PROPOFOL;  Surgeon: Midge Minium, MD;  Location: [facility] ENDOSCOPY;  Service: Endoscopy;  Laterality: N/A;   SIGMOIDOSCOPY  2008   Dr. Evette Cristal. Normal per patient report   Patient Active Problem List   Diagnosis Date Noted   Anxiety, mild 10/04/2021   Difficulty walking 10/04/2021   Hearing deficit, bilateral 10/04/2021   Numbness and tingling 10/04/2021   Sensory ataxia 10/04/2021   Numbness and tingling of both feet 02/08/2021   GAD (generalized anxiety disorder) 02/04/2020   Confusion 02/04/2020   History of colonic polyps    Chronic pain of left ankle 10/07/2018   Bilateral hearing loss 08/21/2018   Cerebral ventriculomegaly 08/21/2018   Chronic pain of right knee 10/02/2016   Enthesopathy of hip 08/02/2009   Rotator cuff syndrome 10/04/2008   Chronic infection of sinus 03/30/2008   HLD (hyperlipidemia) 04/09/2007   Former smoker, stopped smoking  in distant past 11/23/2005   Allergic rhinitis 08/23/2005   Depressive disorder 08/23/2005   Impotence of organic origin 08/23/2005   Migraine without status migrainosus 08/23/2005    ONSET DATE: a couple years ago  REFERRING DIAG: R20.0,R20.2 (ICD-10-CM) - Numbness and tingling of both feet R26.9 (ICD-10-CM) - Abnormality of gait R26.89 (ICD-10-CM) - Balance problem Z91.81 (ICD-10-CM) - At high risk for falls  THERAPY DIAG:  Anesthesia of skin  History of falling  Unsteadiness on feet  Muscle weakness (generalized)  Other abnormalities of gait and mobility  Rationale for Evaluation and Treatment: Rehabilitation  SUBJECTIVE:  SUBJECTIVE STATEMENT: He had his feet scanned and got new tennis shoes since evaluation that he is wearing today.  He presents with NBQC using cane for support with improvement from last session, but scuffs left toe x2 during walk to mat table.  He and caregiver deny falls since last visit. Pt accompanied by: self and family member-daughter-in-law [name]  PERTINENT HISTORY: Depression, cerebral ventriculomegaly, HLD, dementia, bilateral hearing loss, GAD, neuropathy  PAIN:  Are you having pain? No  PRECAUTIONS: Fall  RED FLAGS: None - pt has urge incontinence  WEIGHT BEARING RESTRICTIONS: No  FALLS: Has patient fallen in last 6 months? Yes. 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  Daughter-in-law endorses move to her home is long-term.  OBJECTIVE:   DIAGNOSTIC FINDINGS: No recent relevant imaging.  COGNITION: Overall cognitive status: History of cognitive impairments - at baseline-pt has STM deficits, increased processing time, and delayed recall related to his dementia   SENSATION: Light touch: WFL and N/T from ankle line down into toes  COORDINATION: LE RAMS:  WFL Heel-to-shin:  WFL bilaterally  EDEMA:  None noted in BLE  MUSCLE TONE: None noted in BLE, no clonus present bilaterally  POSTURE: forward head and weight shift right  LOWER EXTREMITY ROM:     Active  Right Eval Left Eval  Hip flexion Grossly WFL  Hip extension   Hip abduction   Hip adduction   Hip internal rotation   Hip external rotation   Knee flexion   Knee extension   Ankle dorsiflexion   Ankle plantarflexion   Ankle inversion    Ankle eversion     (Blank rows = not tested)  LOWER EXTREMITY MMT:    MMT Right Eval Left Eval  Hip flexion    Hip extension    Hip abduction    Hip adduction    Hip internal rotation    Hip external rotation    Knee flexion    Knee extension    Ankle dorsiflexion    Ankle plantarflexion    Ankle inversion    Ankle eversion    (Blank rows = not tested)  BED MOBILITY:  Sit to supine Complete Independence Supine to sit Complete Independence Rolling to Right Complete Independence Rolling to Left Complete Independence  TRANSFERS: Assistive device utilized: Quad cane small base  Sit to stand: SBA and CGA Stand to sit: SBA and CGA Chair to chair: SBA and CGA Floor:  varies based on patient cognition and positional preference (pt prefers posterior bump vs quadruped crawl)  GAIT: Gait pattern: step to pattern, step through pattern, decreased arm swing- Right, decreased arm swing- Left, decreased stride length, shuffling, and narrow BOS Distance walked: various clinic distances Assistive device utilized: Quad cane small  base Level of assistance: SBA and CGA Comments: Patient demonstrates progressive decrease in step size, but not like that of a festination pattern.  He tends to carry the cane vs using it creating trip hazard.  PT attempted to cue patient for gait pattern at end of session w/ inconsistent response to intervention.  Did discuss with patient and daughter the cognitive load of using a  cane and sometimes creating a fall risk due to these difficulties, but did not discourage trying to utilize safe gait pattern with NBQC option at home until further assessment and gait training can be completed.  FUNCTIONAL TESTS:  5 times sit to stand: 21.90 seconds w/ BUE support, pt requires repeat simple commands Timed up and go (TUG): To be assessed. 10 meter walk test: To be assessed. Berg Balance Scale: To be assessed.  PATIENT SURVEYS:  None completed due to cognitive demand.  TODAY'S TREATMENT:                                                                                                                              DATE: 04/24/2023 -TUG w/ SBQC:  28.53 seconds - w/ SBQC:  18.15 seconds = 0.55 m/sec OR 1.82 ft/sec -BERG:  [facility] PT Assessment - 04/24/23 1514       Standardized Balance Assessment   Standardized Balance Assessment Berg Balance Test      Berg Balance Test   Sit to Stand Able to stand  independently using hands    Standing Unsupported Able to stand 2 minutes with supervision    Sitting with Back Unsupported but Feet Supported on Floor or Stool Able to sit safely and securely 2 minutes    Stand to Sit Controls descent by using hands    Transfers Able to transfer with verbal cueing and /or supervision    Standing Unsupported with Eyes Closed Able to stand 10 seconds with supervision    Standing Unsupported with Feet Together Needs help to attain position and unable to hold for 15 seconds    From Standing, Reach Forward with Outstretched Arm Can reach forward >12 cm safely (5")     From Standing Position, Pick up Object from Floor Able to pick up shoe, needs supervision    From Standing Position, Turn to Look Behind Over each Shoulder Looks behind from both sides and weight shifts well    Turn 360 Degrees Needs close supervision or verbal cueing    Standing Unsupported, Alternately Place Feet on Step/Stool Able to complete >2 steps/needs minimal assist    Standing Unsupported, One Foot in Front Needs help to step but can hold 15 seconds    Standing on One Leg Tries to lift leg/unable to hold 3 seconds but remains standing independently    Total Score 32    Berg comment: 32/56 = significant fall risk            Initial HEP: Access Code: [email or web address] URL: [email or web address] Date: 04/24/2023 Prepared by: Camille Bal  Exercises - Sit to Stand with Armchair  - 1 x daily - 7 x weekly - 2 sets - 6 reps - Standing March with Counter Support  - 1 x daily - 7 x weekly - 2 sets - 20 reps   PATIENT EDUCATION: Education details: Initial HEP and goal baselines with interpretations. Person educated: Patient and Caregiver daughter-in-law Arline Asp Education  method: Explanation and Handouts Education comprehension: verbalized understanding and needs further education  HOME EXERCISE PROGRAM: Access Code: [email or web address] URL: [email or web address] Date: 04/24/2023 Prepared by: Camille Bal  Exercises - Sit to Stand with Armchair  - 1 x daily - 7 x weekly - 2 sets - 6 reps - Standing March with Counter Support  - 1 x daily - 7 x weekly - 2 sets - 20 reps  GOALS: Goals reviewed with patient? Yes  SHORT TERM GOALS: Target date: 05/18/2023  Patient and caregiver to be able to verbalize fall prevention strategies in order to promote safety in home environment. Baseline:  Initiate conversation at eval w/ handout to be provided. Goal status: INITIAL  2.  Patient to perform floor recovery using quadruped method vs posterior bump at no more than  modA level in order to improve management at home in the event of a fall. Baseline: Assist varies, pt prefers posterior bump method. Goal status: INITIAL  3.  Pt will ambulate >/=250 feet over level surfaces with proper sequencing of appropriate LRAD and no more than SBA level of assist to promote improved independence with household and community access. Baseline: Various clinic distances w/ mostly CGA due to improper use of cane Goal status: INITIAL  4.  Pt will decrease 5xSTS to </= 17.90 seconds in order to demonstrate decreased risk for falls and improved functional bilateral LE strength and power. Baseline: 21.90 seconds w/ BUE support Goal status: INITIAL  LONG TERM GOALS: Target date: 06/15/2023  Pt will be independent and compliant with BLE strengthening and balance focused HEP in order to maintain functional progress and improve mobility. Baseline: To be established. Goal status: INITIAL  2.  Patient will be compliant to formal walking program >/= 4 days per week w/ supervision from family to improve aerobic tolerance and ambulatory mechanics w/ appropriate AD. Baseline: To be established. Goal status: INITIAL  3.  Pt will decrease 5xSTS to </= 13.90 seconds in order to demonstrate decreased risk for falls and improved functional bilateral LE strength and power. Baseline: 21.90 seconds w/ BUE support Goal status: INITIAL  4.  Pt will demonstrate TUG of </=23.53 seconds in order to decrease risk of falls and improve functional mobility using LRAD. Baseline: 28.53 seconds w/ SBQC (7/30) Goal status: INITIAL  5.  Pt will demonstrate a gait speed of >/=2.02 feet/sec in order to decrease risk for falls. Baseline: 0.55 m/sec OR 1.82 ft/sec (7/30) Goal status: INITIAL  6.  Pt will increase BERG balance score to >/=36/56 to demonstrate improved static balance. Baseline: 32/56 (7/30) Goal status: INITIAL  ASSESSMENT:  CLINICAL IMPRESSION: Assessed TUG, , and BERG this  visit with goals set to reflect current functional level.  He requires [facility] for all testing and demonstrated improved sequencing this visit.  His tug time was 28.53 seconds and his gait speed 1.82 ft/sec both indicative of fall risk.  Did educate patient and family member on trade off between speed and stability with goal being a natural progression vs simply cuing to increase speed from a safety standpoint.  His BERG demonstrates static instability with a score of 32/56 which supports use of 2-handed assistive device.  Will trial options in the future, but cognitive load of learning a new AD in regards to dementia may prove difficult.  He continues to benefit from skilled PT services to address impairments and decrease fall risk as able.  OBJECTIVE IMPAIRMENTS: Abnormal gait, decreased activity tolerance, decreased balance, decreased cognition, difficulty walking, impaired sensation, improper  body mechanics, and postural dysfunction.   ACTIVITY LIMITATIONS: carrying, lifting, bending, standing, squatting, stairs, transfers, reach over head, and locomotion level  PARTICIPATION LIMITATIONS: meal prep, cleaning, laundry, driving, shopping, and community activity  PERSONAL FACTORS: Age, Fitness, Past/current experiences, Time since onset of injury/illness/exacerbation, and 1-2 comorbidities: dementia, neuropathy  are also affecting patient's functional outcome.   REHAB POTENTIAL: Fair See PMH and personal factors  CLINICAL DECISION MAKING: Evolving/moderate complexity  EVALUATION COMPLEXITY: Moderate  PLAN:  PT FREQUENCY: 1x/week  PT DURATION: 8 weeks  PLANNED INTERVENTIONS: Therapeutic exercises, Therapeutic activity, Neuromuscular re-education, Balance training, Gait training, Patient/Family education, Self Care, Stair training, Vestibular training, DME instructions, and Re-evaluation  PLAN FOR NEXT SESSION:  Fall prevention handout.  Add to HEP for bilateral LE strength and balance.  Initiate  supervised walking program.  Gait training w/ NBQC (pt's personal cane) vs RW vs none?  Looking, bending, and turning/direction changing.  Sadie Haber, PT, DPT 04/24/2023, 3:46 PM

## 2023-04-24 NOTE — Patient Instructions (Signed)
Access Code: [email or web address] URL: [email or web address] Date: 04/24/2023 Prepared by: Camille Bal  Exercises - Sit to Stand with Armchair  - 1 x daily - 7 x weekly - 2 sets - 6 reps - Standing March with Counter Support  - 1 x daily - 7 x weekly - 2 sets - 20 reps

## 2023-04-30 ENCOUNTER — Encounter: Payer: Self-pay | Admitting: Physical Therapy

## 2023-04-30 ENCOUNTER — Ambulatory Visit: Payer: PPO | Attending: Podiatry | Admitting: Physical Therapy

## 2023-04-30 DIAGNOSIS — R2681 Unsteadiness on feet: Secondary | ICD-10-CM | POA: Diagnosis not present

## 2023-04-30 DIAGNOSIS — R2689 Other abnormalities of gait and mobility: Secondary | ICD-10-CM | POA: Insufficient documentation

## 2023-04-30 DIAGNOSIS — M6281 Muscle weakness (generalized): Secondary | ICD-10-CM | POA: Insufficient documentation

## 2023-04-30 DIAGNOSIS — Z9181 History of falling: Secondary | ICD-10-CM | POA: Diagnosis not present

## 2023-04-30 DIAGNOSIS — R200 Anesthesia of skin: Secondary | ICD-10-CM | POA: Diagnosis not present

## 2023-04-30 NOTE — Patient Instructions (Addendum)
-   Standing with Head Rotation  - 1 x daily - 7 x weekly - 3 sets - 10 reps - Feet Together Balance at [employer] Eyes Open  - 1 x daily - 7 x weekly - 1 sets - 3 reps - 20-30 seconds hold - Corner Balance Feet Together With Eyes Closed  - 1 x daily - 7 x weekly - 1 sets - 3 reps - 15-20 seconds hold - Standing Tandem Balance with Counter Support  - 1 x daily - 7 x weekly - 1 sets - 4 reps - 30 seconds hold  You Can Walk For A Certain Length Of Time Each Day (with family and use AD for safe endurance)                          Walk 4 minutes 3 times per day.             Increase 1-2  minutes every week.             Work up to 15 minutes (1-2 times per day).               Example:                         Day 1-2           4-5 minutes     3 times per day                         Day 7-8           10-12 minutes 2-3 times per day                         Day 13-14       20-22 minutes 1-2 times per day

## 2023-04-30 NOTE — Therapy (Signed)
OUTPATIENT PHYSICAL THERAPY NEURO TREATMENT   Patient Name: Cameron King MRN: [id number] DOB:07-05-1946, 77 y.o., male Today's Date: 04/30/2023   PCP: Cameron Limes, MD REFERRING PROVIDER: Elinor King, [place]  END OF SESSION:   PT End of Session - 04/30/23 0934     Visit Number 3    Number of Visits 9   8 + eval   Date for PT Re-Evaluation 06/22/23   pushed out due to scheduling delay   Authorization Type [employer]    PT Start Time 0930    PT Stop Time 1015    PT Time Calculation (min) 45 min    Equipment Utilized During Treatment Gait belt    Activity Tolerance Patient tolerated treatment well    Behavior During Therapy [facility] for tasks assessed/performed            Past Medical History:  Diagnosis Date   Depression    High cholesterol    Hypertension    Meniere disease    Ocular Migraines    Shingles 07/16/2015   of eye    Past Surgical History:  Procedure Laterality Date   COLONOSCOPY     COLONOSCOPY WITH PROPOFOL N/A 10/29/2018   Procedure: COLONOSCOPY WITH PROPOFOL;  Surgeon: Midge Minium, MD;  Location: [facility] ENDOSCOPY;  Service: Endoscopy;  Laterality: N/A;   SIGMOIDOSCOPY  2008   Dr. Evette Cristal. Normal per patient report   Patient Active Problem List   Diagnosis Date Noted   Anxiety, mild 10/04/2021   Difficulty walking 10/04/2021   Hearing deficit, bilateral 10/04/2021   Numbness and tingling 10/04/2021   Sensory ataxia 10/04/2021   Numbness and tingling of both feet 02/08/2021   GAD (generalized anxiety disorder) 02/04/2020   Confusion 02/04/2020   History of colonic polyps    Chronic pain of left ankle 10/07/2018   Bilateral hearing loss 08/21/2018   Cerebral ventriculomegaly 08/21/2018   Chronic pain of right knee 10/02/2016   Enthesopathy of hip 08/02/2009   Rotator cuff syndrome 10/04/2008   Chronic infection of sinus 03/30/2008   HLD (hyperlipidemia) 04/09/2007   Former smoker, stopped smoking in distant past 11/23/2005   Allergic  rhinitis 08/23/2005   Depressive disorder 08/23/2005   Impotence of organic origin 08/23/2005   Migraine without status migrainosus 08/23/2005    ONSET DATE: a couple years ago  REFERRING DIAG: R20.0,R20.2 (ICD-10-CM) - Numbness and tingling of both feet R26.9 (ICD-10-CM) - Abnormality of gait R26.89 (ICD-10-CM) - Balance problem Z91.81 (ICD-10-CM) - At high risk for falls  THERAPY DIAG:  Anesthesia of skin  History of falling  Unsteadiness on feet  Muscle weakness (generalized)  Other abnormalities of gait and mobility  Rationale for Evaluation and Treatment: Rehabilitation  SUBJECTIVE:  SUBJECTIVE STATEMENT: He denies falls and caregiver confirms no known falls.  He presents with NBQC using cane for support with intermittent use.  His feet are bothering him more today.  He has been doing his HEP and states "they are good and then they're bad". Pt accompanied by: self and family member-daughter-in-law [name]  PERTINENT HISTORY: Depression, cerebral ventriculomegaly, HLD, dementia, bilateral hearing loss, GAD, neuropathy  PAIN:  Are you having pain? Yes: NPRS scale: 5/10 Pain location: left ankle and bilateral feet Pain description: sore Aggravating factors: standing up or walking Relieving factors: sitting still  PRECAUTIONS: Fall  RED FLAGS: None - pt has urge incontinence  WEIGHT BEARING RESTRICTIONS: No  FALLS: Has patient fallen in last 6 months? Yes. 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  Daughter-in-law endorses move to her home is long-term.  OBJECTIVE:   DIAGNOSTIC FINDINGS: No recent relevant imaging.  COGNITION: Overall cognitive status: History of cognitive impairments - at baseline-pt has STM deficits, increased processing time, and delayed recall related to his dementia   SENSATION: Light touch: WFL and N/T from ankle line down into toes  COORDINATION: LE RAMS:  WFL Heel-to-shin:  WFL bilaterally  EDEMA:  None noted in BLE  MUSCLE TONE: None noted in BLE, no clonus present bilaterally  POSTURE: forward head and weight shift right  LOWER EXTREMITY ROM:     Active  Right Eval Left Eval  Hip flexion Grossly WFL  Hip extension   Hip abduction   Hip adduction   Hip internal rotation   Hip external rotation   Knee flexion   Knee extension   Ankle dorsiflexion   Ankle plantarflexion   Ankle inversion    Ankle eversion     (Blank rows = not tested)  LOWER EXTREMITY MMT:    MMT Right Eval Left Eval  Hip flexion    Hip extension    Hip abduction    Hip adduction    Hip internal rotation    Hip external rotation    Knee flexion    Knee extension    Ankle dorsiflexion    Ankle plantarflexion    Ankle inversion    Ankle eversion    (Blank rows = not tested)  BED MOBILITY:  Sit to supine Complete Independence Supine to sit Complete Independence Rolling to Right Complete Independence Rolling to Left Complete Independence  TRANSFERS: Assistive device utilized: Quad cane small base  Sit to stand: SBA and CGA Stand to sit: SBA and CGA Chair to chair: SBA and CGA Floor:  varies based on patient cognition and positional preference (pt prefers posterior bump vs quadruped crawl)  GAIT: Gait pattern: step to pattern, step through pattern, decreased arm swing- Right, decreased arm swing- Left, decreased stride length, shuffling, and narrow  BOS Distance walked: various clinic distances Assistive device utilized: Quad cane small base Level of assistance: SBA and CGA Comments: Patient demonstrates progressive decrease in step size, but not like that of a festination pattern.  He tends to carry the cane vs using it creating trip hazard.  PT attempted to cue patient for gait pattern at end of session  w/ inconsistent response to intervention.  Did discuss with patient and daughter the cognitive load of using a cane and sometimes creating a fall risk due to these difficulties, but did not discourage trying to utilize safe gait pattern with NBQC option at home until further assessment and gait training can be completed.  FUNCTIONAL TESTS:  5 times sit to stand: 21.90 seconds w/ BUE support, pt requires repeat simple commands Timed up and go (TUG): To be assessed. 10 meter walk test: To be assessed. Berg Balance Scale: To be assessed.  PATIENT SURVEYS:  None completed due to cognitive demand.  TODAY'S TREATMENT:                                                                                                                              DATE: 04/30/2023 -Reviewed handout for fall prevention recommendations, pt has not had a vision screening in several years so time spent discussing balance systems and need to make sure visual acuity is reliable to help prevent falls (daughter-in-law states he has not wanted to see the eye doctor or dentist).  -Pt has trouble standing from mat table so several reps performed allowing for part-whole practice and error recognition by patient with attempts at self-correction with minimal cuing.  -Added to HEP: - Standing with Head Rotation  - 1 x daily - 7 x weekly - 3 sets - 10 reps - Feet Together Balance at [employer] Eyes Open  - 1 x daily - 7 x weekly - 1 sets - 3 reps - 20-30 seconds hold - Corner Balance Feet Together With Eyes Closed  - 1 x daily - 7 x weekly - 1 sets - 3 reps - 15-20 seconds hold -  Standing Tandem Balance with Counter Support  - 1 x daily - 7 x weekly - 1 sets - 4 reps - 30 seconds hold  You Can Walk For A Certain Length Of Time Each Day (with family and use AD for safe endurance)                          Walk 4 minutes 3 times per day.             Increase 1-2  minutes every week.             Work up to 15 minutes (1-2 times per day).               Example:                         Day 1-2           4-5 minutes     3 times per day                         Day 7-8  10-12 minutes 2-3 times per day                         Day 13-14       20-22 minutes 1-2 times per day   PATIENT EDUCATION: Education details: Fall prevention handout.  Keep area that scabbed over on right ankle from fall prior to PT eval clean and dry.  Supervised walking program.  Encouraged continued use of the cane at home for continuity and safety. Person educated: Patient and Caregiver daughter-in-law Arline Asp Education method: Explanation and Handouts Education comprehension: verbalized understanding and needs further education  HOME EXERCISE PROGRAM: Access Code: [email or web address] URL: [email or web address] Date: 04/30/2023 Prepared by: Camille Bal  Exercises - Sit to Stand with Armchair  - 1 x daily - 7 x weekly - 2 sets - 6 reps - Standing March with Counter Support  - 1 x daily - 7 x weekly - 2 sets - 20 reps - Standing with Head Rotation  - 1 x daily - 7 x weekly - 3 sets - 10 reps - Feet Together Balance at [employer] Eyes Open  - 1 x daily - 7 x weekly - 1 sets - 3 reps - 20-30 seconds hold - Corner Balance Feet Together With Eyes Closed  - 1 x daily - 7 x weekly - 1 sets - 3 reps - 15-20 seconds hold - Standing Tandem Balance with Counter Support  - 1 x daily - 7 x weekly - 1 sets - 4 reps - 30 seconds hold  Provided fall prevention handout 04/30/2023.  You Can Walk For A Certain Length Of Time Each Day (with family and use AD for safe endurance)                           Walk 4 minutes 3 times per day.             Increase 1-2  minutes every week.             Work up to 15 minutes (1-2 times per day).               Example:                         Day 1-2           4-5 minutes     3 times per day                         Day 7-8           10-12 minutes 2-3 times per day                         Day 13-14       20-22 minutes 1-2 times per day  GOALS: Goals reviewed with patient? Yes  SHORT TERM GOALS: Target date: 05/18/2023  Patient and caregiver to be able to verbalize fall prevention strategies in order to promote safety in home environment. Baseline:  Initiate conversation at eval w/ handout to be provided. Goal status: INITIAL  2.  Patient to perform floor recovery using quadruped method vs posterior bump at no more than modA level in order to improve management at home in the event of a fall. Baseline: Assist varies, pt prefers posterior bump method. Goal status: INITIAL  3.  Pt will ambulate >/=250 feet over level surfaces with proper sequencing of appropriate LRAD and no more than SBA level of assist to promote improved independence with household and community access. Baseline: Various clinic distances w/ mostly CGA due to improper use of cane Goal status: INITIAL  4.  Pt will decrease 5xSTS to </= 17.90 seconds in order to demonstrate decreased risk for falls and improved functional bilateral LE strength and power. Baseline: 21.90 seconds w/ BUE support Goal status: INITIAL  LONG TERM GOALS: Target date: 06/15/2023  Pt will be independent and compliant with BLE strengthening and balance focused HEP in order to maintain functional progress and improve mobility. Baseline: To be established. Goal status: INITIAL  2.  Patient will be compliant to formal walking program >/= 4 days per week w/ supervision from family to improve aerobic tolerance and ambulatory mechanics w/ appropriate AD. Baseline: To be established. Goal status:  INITIAL  3.  Pt will decrease 5xSTS to </= 13.90 seconds in order to demonstrate decreased risk for falls and improved functional bilateral LE strength and power. Baseline: 21.90 seconds w/ BUE support Goal status: INITIAL  4.  Pt will demonstrate TUG of </=23.53 seconds in order to decrease risk of falls and improve functional mobility using LRAD. Baseline: 28.53 seconds w/ SBQC (7/30) Goal status: INITIAL  5.  Pt will demonstrate a gait speed of >/=2.02 feet/sec in order to decrease risk for falls. Baseline: 0.55 m/sec OR 1.82 ft/sec (7/30) Goal status: INITIAL  6.  Pt will increase BERG balance score to >/=36/56 to demonstrate improved static balance. Baseline: 32/56 (7/30) Goal status: INITIAL  ASSESSMENT:  CLINICAL IMPRESSION: Made additions to HEP today to address static standing balance with varied BOS.  Patient requires supervision for both HEP and walking program initiated today and caregiver in agreement to this.  He continues to benefit from skilled PT to progress balance strategies and practice gait with most beneficial AD as able.  Will continue per POC.  OBJECTIVE IMPAIRMENTS: Abnormal gait, decreased activity tolerance, decreased balance, decreased cognition, difficulty walking, impaired sensation, improper body mechanics, and postural dysfunction.   ACTIVITY LIMITATIONS: carrying, lifting, bending, standing, squatting, stairs, transfers, reach over head, and locomotion level  PARTICIPATION LIMITATIONS: meal prep, cleaning, laundry, driving, shopping, and community activity  PERSONAL FACTORS: Age, Fitness, Past/current experiences, Time since onset of injury/illness/exacerbation, and 1-2 comorbidities: dementia, neuropathy  are also affecting patient's functional outcome.   REHAB POTENTIAL: Fair See PMH and personal factors  CLINICAL DECISION MAKING: Evolving/moderate complexity  EVALUATION COMPLEXITY: Moderate  PLAN:  PT FREQUENCY: 1x/week  PT DURATION: 8  weeks  PLANNED INTERVENTIONS: Therapeutic exercises, Therapeutic activity, Neuromuscular re-education, Balance training, Gait training, Patient/Family education, Self Care, Stair training, Vestibular training, DME instructions, and Re-evaluation  PLAN FOR NEXT SESSION:  Add to HEP for bilateral LE strength and balance.  Gait training w/ NBQC (pt's personal cane) vs RW vs none-unlevel, obstacle and hurdle navigation.  Looking, bending, and turning/direction changing.  Sadie Haber, PT, DPT 04/30/2023, 10:15 AM

## 2023-05-11 ENCOUNTER — Ambulatory Visit: Payer: PPO | Admitting: Physical Therapy

## 2023-05-14 ENCOUNTER — Ambulatory Visit: Payer: PPO | Admitting: Physical Therapy

## 2023-05-14 DIAGNOSIS — F419 Anxiety disorder, unspecified: Secondary | ICD-10-CM | POA: Diagnosis not present

## 2023-05-14 DIAGNOSIS — R200 Anesthesia of skin: Secondary | ICD-10-CM | POA: Diagnosis not present

## 2023-05-14 DIAGNOSIS — R296 Repeated falls: Secondary | ICD-10-CM | POA: Diagnosis not present

## 2023-05-14 DIAGNOSIS — G9389 Other specified disorders of brain: Secondary | ICD-10-CM | POA: Diagnosis not present

## 2023-05-14 DIAGNOSIS — R413 Other amnesia: Secondary | ICD-10-CM | POA: Diagnosis not present

## 2023-05-14 DIAGNOSIS — R202 Paresthesia of skin: Secondary | ICD-10-CM | POA: Diagnosis not present

## 2023-05-17 ENCOUNTER — Ambulatory Visit: Payer: PPO | Admitting: Physical Therapy

## 2023-05-17 ENCOUNTER — Encounter: Payer: Self-pay | Admitting: Physical Therapy

## 2023-05-17 DIAGNOSIS — R200 Anesthesia of skin: Secondary | ICD-10-CM | POA: Diagnosis not present

## 2023-05-17 DIAGNOSIS — R2681 Unsteadiness on feet: Secondary | ICD-10-CM

## 2023-05-17 DIAGNOSIS — R2689 Other abnormalities of gait and mobility: Secondary | ICD-10-CM

## 2023-05-17 DIAGNOSIS — M6281 Muscle weakness (generalized): Secondary | ICD-10-CM

## 2023-05-17 DIAGNOSIS — Z9181 History of falling: Secondary | ICD-10-CM

## 2023-05-17 NOTE — Therapy (Signed)
OUTPATIENT PHYSICAL THERAPY NEURO TREATMENT   Patient Name: Cameron King MRN: [id number] DOB:[birth year], 77 y.o., male Today's Date: 05/17/2023   PCP: Malva Limes, MD REFERRING PROVIDER: Elinor Parkinson, [place]  END OF SESSION:   PT End of Session - 05/17/23 1409     Visit Number 4    Number of Visits 9   8 + eval   Date for PT Re-Evaluation 06/22/23   pushed out due to scheduling delay   Authorization Type [employer]    PT Start Time 1406    PT Stop Time 1453    PT Time Calculation (min) 47 min    Equipment Utilized During Treatment Gait belt    Activity Tolerance Patient tolerated treatment well    Behavior During Therapy WFL for tasks assessed/performed            Past Medical History:  Diagnosis Date   Depression    High cholesterol    Hypertension    Meniere disease    Ocular Migraines    Shingles 07/16/2015   of eye    Past Surgical History:  Procedure Laterality Date   COLONOSCOPY     COLONOSCOPY WITH PROPOFOL N/A 10/29/2018   Procedure: COLONOSCOPY WITH PROPOFOL;  Surgeon: Midge Minium, MD;  [facility];  Service: Endoscopy;  Laterality: N/A;   SIGMOIDOSCOPY  2008   Dr. Evette Cristal. Normal per patient report   Patient Active Problem List   Diagnosis Date Noted   Anxiety, mild 10/04/2021   Difficulty walking 10/04/2021   Hearing deficit, bilateral 10/04/2021   Numbness and tingling 10/04/2021   Sensory ataxia 10/04/2021   Numbness and tingling of both feet 02/08/2021   GAD (generalized anxiety disorder) 02/04/2020   Confusion 02/04/2020   History of colonic polyps    Chronic pain of left ankle 10/07/2018   Bilateral hearing loss 08/21/2018   Cerebral ventriculomegaly 08/21/2018   Chronic pain of right knee 10/02/2016   Enthesopathy of hip 08/02/2009   Rotator cuff syndrome 10/04/2008   Chronic infection of sinus 03/30/2008   HLD (hyperlipidemia) 04/09/2007   Former smoker, stopped smoking in distant past 11/23/2005    Allergic rhinitis 08/23/2005   Depressive disorder 08/23/2005   Impotence of organic origin 08/23/2005   Migraine without status migrainosus 08/23/2005    ONSET DATE: a couple years ago  REFERRING DIAG: R20.0,R20.2 (ICD-10-CM) - Numbness and tingling of both feet R26.9 (ICD-10-CM) - Abnormality of gait R26.89 (ICD-10-CM) - Balance problem Z91.81 (ICD-10-CM) - At high risk for falls  THERAPY DIAG:  History of falling  Unsteadiness on feet  Muscle weakness (generalized)  Other abnormalities of gait and mobility  Rationale for Evaluation and Treatment: Rehabilitation  SUBJECTIVE:  SUBJECTIVE STATEMENT: Patient had a single fall the day after he had his most recent MD appt.  At this appt his Nam[place] was doubled.  When he fell he went on a 20 minute outdoor walk without his cane, family at a distance.  He went over to see some yellowbirds and just fell.  He reports landing on his left knee and hip and has not had issues since then.  He denies hitting head. Pt accompanied by: self and family member-daughter-in-law [name]  PERTINENT HISTORY: Depression, cerebral ventriculomegaly, HLD, dementia, bilateral hearing loss, GAD, neuropathy  PAIN:  Are you having pain? No  PRECAUTIONS: Fall  RED FLAGS: None - pt has urge incontinence  WEIGHT BEARING RESTRICTIONS: No  FALLS: Has patient fallen in last 6 months? Yes. 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  Daughter-in-law endorses move to her home is long-term.  OBJECTIVE:   DIAGNOSTIC FINDINGS: No recent relevant imaging.  COGNITION: Overall cognitive status: History of cognitive impairments - at baseline-pt has STM deficits, increased processing time, and delayed recall related to his dementia   SENSATION: Light touch: WFL and N/T from ankle line down into toes  COORDINATION: LE RAMS:  WFL Heel-to-shin:  WFL bilaterally  EDEMA:  None noted in BLE  MUSCLE TONE: None noted in BLE, no clonus present bilaterally  POSTURE: forward head and weight shift right  LOWER EXTREMITY ROM:     Active  Right Eval Left Eval  Hip flexion Grossly WFL  Hip extension   Hip abduction   Hip adduction   Hip internal rotation   Hip external rotation   Knee flexion   Knee extension   Ankle dorsiflexion   Ankle plantarflexion   Ankle inversion    Ankle eversion     (Blank rows = not tested)  LOWER EXTREMITY MMT:    MMT Right Eval Left Eval  Hip flexion    Hip extension    Hip abduction    Hip adduction    Hip internal rotation    Hip external rotation    Knee flexion    Knee extension    Ankle dorsiflexion    Ankle plantarflexion    Ankle inversion    Ankle eversion    (Blank rows = not tested)  BED MOBILITY:  Sit to supine Complete Independence Supine to sit Complete Independence Rolling to Right Complete Independence Rolling to Left Complete Independence  TRANSFERS: Assistive device utilized: Quad cane small base  Sit to stand: SBA and CGA Stand to sit: SBA and CGA Chair to chair: SBA and CGA Floor:  varies based on patient cognition and positional preference (pt prefers posterior bump vs quadruped crawl)  GAIT: Gait pattern: step to pattern, step through pattern, decreased arm swing- Right, decreased arm swing- Left, decreased stride length, shuffling, and narrow BOS Distance walked: various clinic distances Assistive  device utilized: Quad cane small base Level of assistance: SBA and CGA Comments: Patient demonstrates progressive decrease in step size, but not like that of a festination pattern.  He tends to carry the cane vs using it creating trip hazard.  PT attempted to cue patient  for gait pattern at end of session w/ inconsistent response to intervention.  Did discuss with patient and daughter the cognitive load of using a cane and sometimes creating a fall risk due to these difficulties, but did not discourage trying to utilize safe gait pattern with NBQC option at home until further assessment and gait training can be completed.  FUNCTIONAL TESTS:  5 times sit to stand: 21.90 seconds w/ BUE support, pt requires repeat simple commands Timed up and go (TUG): To be assessed. 10 meter walk test: To be assessed. Berg Balance Scale: To be assessed.  PATIENT SURVEYS:  None completed due to cognitive demand.  TODAY'S TREATMENT:                                                                                                                              DATE: 05/17/2023 -Discussed having close supervision and using cane for walks when outside of home.  Discussed fall recovery with patient able to get off ground with minA of family member stabilizing cane so he could pull up using the cane.  Discussed sequencing of floor recovery with goal of reviewing this next session in favor of practicing cane over unlevel ground today.  GAIT: Gait pattern: step through pattern, decreased arm swing- Left, decreased stride length, shuffling, narrow BOS, poor foot clearance- Right, and poor foot clearance- Left Distance walked: 250 ft + 225 ft Assistive device utilized: Single point cane and Quad cane small base Level of assistance: SBA and CGA Comments: First bout of gait over unlevel outdoor sidewalk and grass using SBQC.  Patient uses cane for balance in grass, but needs cues and facilitation to properly sequence and prevent  pushing on cane in posterior position.  Indoors over level flooring practiced with SPC with patient not displaying increased levels of fatigue or imbalance compared to [facility] and he appeared to use the [facility] more consistently w/ good sequencing vs lifting the SBQC up and not using it to "get it out of the way".   Practiced 4" hurdles w/ SBQC 2x10' > SPC 2x10', CGA progressed to SBA w/ good sequencing w/ min cues for approximation to obstacle   PATIENT EDUCATION: Education details: Floor sequencing and having supervision on outdoor walks.  Using SPC vs SBQC for routine or short distance tasks to practice with SPC if purchased.  Discussed purchasing options with daughter-in-law and plan to continue gait training w/ SPC in future due to improvements noted with use today. Person educated: Patient and Caregiver daughter-in-law Arline Asp Education method: Explanation and Handouts Education comprehension: verbalized understanding and needs further education  HOME EXERCISE PROGRAM: Access Code: [email or web address] URL: [email or web address] Date: 04/30/2023 Prepared by: Camille Bal  Exercises - Sit to Stand with Armchair  - 1 x daily - 7 x weekly - 2 sets - 6 reps - Standing March with Counter Support  - 1 x daily - 7 x weekly - 2 sets - 20 reps - Standing with Head  Rotation  - 1 x daily - 7 x weekly - 3 sets - 10 reps - Feet Together Balance at [employer] Open  - 1 x daily - 7 x weekly - 1 sets - 3 reps - 20-30 seconds hold - Corner Balance Feet Together With Eyes Closed  - 1 x daily - 7 x weekly - 1 sets - 3 reps - 15-20 seconds hold - Standing Tandem Balance with Counter Support  - 1 x daily - 7 x weekly - 1 sets - 4 reps - 30 seconds hold  Provided fall prevention handout 04/30/2023.  You Can Walk For A Certain Length Of Time Each Day (with family and use AD for safe endurance)                          Walk 4 minutes 3 times per day.             Increase 1-2  minutes every week.              Work up to 15 minutes (1-2 times per day).               Example:                         Day 1-2           4-5 minutes     3 times per day                         Day 7-8           10-12 minutes 2-3 times per day                         Day 13-14       20-22 minutes 1-2 times per day  GOALS: Goals reviewed with patient? Yes  SHORT TERM GOALS: Target date: 05/18/2023  Patient and caregiver to be able to verbalize fall prevention strategies in order to promote safety in home environment. Baseline:  Initiate conversation at eval w/ handout to be provided. Goal status: INITIAL  2.  Patient to perform floor recovery using quadruped method vs posterior bump at no more than modA level in order to improve management at home in the event of a fall. Baseline: Assist varies, pt prefers posterior bump method. Goal status: INITIAL  3.  Pt will ambulate >/=250 feet over level surfaces with proper sequencing of appropriate LRAD and no more than SBA level of assist to promote improved independence with household and community access. Baseline: Various clinic distances w/ mostly CGA due to improper use of cane Goal status: INITIAL  4.  Pt will decrease 5xSTS to </= 17.90 seconds in order to demonstrate decreased risk for falls and improved functional bilateral LE strength and power. Baseline: 21.90 seconds w/ BUE support Goal status: INITIAL  LONG TERM GOALS: Target date: 06/15/2023  Pt will be independent and compliant with BLE strengthening and balance focused HEP in order to maintain functional progress and improve mobility. Baseline: To be established. Goal status: INITIAL  2.  Patient will be compliant to formal walking program >/= 4 days per week w/ supervision from family to improve aerobic tolerance and ambulatory mechanics w/ appropriate AD. Baseline: To be established. Goal status: INITIAL  3.  Pt will decrease 5xSTS to </= 13.90 seconds in  order to demonstrate decreased risk for  falls and improved functional bilateral LE strength and power. Baseline: 21.90 seconds w/ BUE support Goal status: INITIAL  4.  Pt will demonstrate TUG of </=23.53 seconds in order to decrease risk of falls and improve functional mobility using LRAD. Baseline: 28.53 seconds w/ SBQC (7/30) Goal status: INITIAL  5.  Pt will demonstrate a gait speed of >/=2.02 feet/sec in order to decrease risk for falls. Baseline: 0.55 m/sec OR 1.82 ft/sec (7/30) Goal status: INITIAL  6.  Pt will increase BERG balance score to >/=36/56 to demonstrate improved static balance. Baseline: 32/56 (7/30) Goal status: INITIAL  ASSESSMENT:  CLINICAL IMPRESSION: Made additions to HEP today to address static standing balance with varied BOS.  Patient requires supervision for both HEP and walking program initiated today and caregiver in agreement to this.  He continues to benefit from skilled PT to progress balance strategies and practice gait with most beneficial AD as able.  Will continue per POC.  OBJECTIVE IMPAIRMENTS: Abnormal gait, decreased activity tolerance, decreased balance, decreased cognition, difficulty walking, impaired sensation, improper body mechanics, and postural dysfunction.   ACTIVITY LIMITATIONS: carrying, lifting, bending, standing, squatting, stairs, transfers, reach over head, and locomotion level  PARTICIPATION LIMITATIONS: meal prep, cleaning, laundry, driving, shopping, and community activity  PERSONAL FACTORS: Age, Fitness, Past/current experiences, Time since onset of injury/illness/exacerbation, and 1-2 comorbidities: dementia, neuropathy  are also affecting patient's functional outcome.   REHAB POTENTIAL: Fair See PMH and personal factors  CLINICAL DECISION MAKING: Evolving/moderate complexity  EVALUATION COMPLEXITY: Moderate  PLAN:  PT FREQUENCY: 1x/week  PT DURATION: 8 weeks  PLANNED INTERVENTIONS: Therapeutic exercises, Therapeutic activity, Neuromuscular re-education,  Balance training, Gait training, Patient/Family education, Self Care, Stair training, Vestibular training, DME instructions, and Re-evaluation  PLAN FOR NEXT SESSION:  Add to HEP for bilateral LE strength and balance.  Gait training w/ SBQC vs SPC, obstacle and high hurdle navigation.  Looking, bending, and turning/direction changing.  Floor recovery.  ASSESS STGs!  Sadie Haber, PT, DPT 05/17/2023, 4:58 PM

## 2023-05-21 ENCOUNTER — Encounter: Payer: Self-pay | Admitting: Physical Therapy

## 2023-05-21 ENCOUNTER — Ambulatory Visit: Payer: PPO | Admitting: Physical Therapy

## 2023-05-21 DIAGNOSIS — R2689 Other abnormalities of gait and mobility: Secondary | ICD-10-CM

## 2023-05-21 DIAGNOSIS — R2681 Unsteadiness on feet: Secondary | ICD-10-CM

## 2023-05-21 DIAGNOSIS — R200 Anesthesia of skin: Secondary | ICD-10-CM | POA: Diagnosis not present

## 2023-05-21 DIAGNOSIS — M6281 Muscle weakness (generalized): Secondary | ICD-10-CM

## 2023-05-21 DIAGNOSIS — Z9181 History of falling: Secondary | ICD-10-CM

## 2023-05-21 NOTE — Therapy (Signed)
OUTPATIENT PHYSICAL THERAPY NEURO TREATMENT   Patient Name: Cameron King MRN: [id number] DOB:12-03-1945, 77 y.o., male Today's Date: 05/21/2023   PCP: Malva Limes, MD REFERRING PROVIDER: Elinor Parkinson, [place]  END OF SESSION:   PT End of Session - 05/21/23 1751     Visit Number 5    Number of Visits 9   8 + eval   Date for PT Re-Evaluation 06/22/23   pushed out due to scheduling delay   Authorization Type [employer]    PT Start Time 1318    PT Stop Time 1403    PT Time Calculation (min) 45 min    Equipment Utilized During Treatment Gait belt    Activity Tolerance Patient tolerated treatment well    Behavior During Therapy WFL for tasks assessed/performed            Past Medical History:  Diagnosis Date   Depression    High cholesterol    Hypertension    Meniere disease    Ocular Migraines    Shingles 07/16/2015   of eye    Past Surgical History:  Procedure Laterality Date   COLONOSCOPY     COLONOSCOPY WITH PROPOFOL N/A 10/29/2018   Procedure: COLONOSCOPY WITH PROPOFOL;  Surgeon: Midge Minium, MD;  Location: [facility] ENDOSCOPY;  Service: Endoscopy;  Laterality: N/A;   SIGMOIDOSCOPY  2008   Dr. Evette Cristal. Normal per patient report   Patient Active Problem List   Diagnosis Date Noted   Anxiety, mild 10/04/2021   Difficulty walking 10/04/2021   Hearing deficit, bilateral 10/04/2021   Numbness and tingling 10/04/2021   Sensory ataxia 10/04/2021   Numbness and tingling of both feet 02/08/2021   GAD (generalized anxiety disorder) 02/04/2020   Confusion 02/04/2020   History of colonic polyps    Chronic pain of left ankle 10/07/2018   Bilateral hearing loss 08/21/2018   Cerebral ventriculomegaly 08/21/2018   Chronic pain of right knee 10/02/2016   Enthesopathy of hip 08/02/2009   Rotator cuff syndrome 10/04/2008   Chronic infection of sinus 03/30/2008   HLD (hyperlipidemia) 04/09/2007   Former smoker, stopped smoking in distant past 11/23/2005    Allergic rhinitis 08/23/2005   Depressive disorder 08/23/2005   Impotence of organic origin 08/23/2005   Migraine without status migrainosus 08/23/2005    ONSET DATE: a couple years ago  REFERRING DIAG: R20.0,R20.2 (ICD-10-CM) - Numbness and tingling of both feet R26.9 (ICD-10-CM) - Abnormality of gait R26.89 (ICD-10-CM) - Balance problem Z91.81 (ICD-10-CM) - At high risk for falls  THERAPY DIAG:  History of falling  Unsteadiness on feet  Muscle weakness (generalized)  Other abnormalities of gait and mobility  Rationale for Evaluation and Treatment: Rehabilitation  SUBJECTIVE:  SUBJECTIVE STATEMENT: He ambulates in using new SPC w/ better sequencing and overall use of cane noted.  Patient has not had further falls, but he is having some left ankle pain today that he attributes to his most recent fall prior to last visit.   Pt accompanied by: self and family member-daughter-in-law [name]  PERTINENT HISTORY: Depression, cerebral ventriculomegaly, HLD, dementia, bilateral hearing loss, GAD, neuropathy  PAIN:  Are you having pain? Yes: NPRS scale: 5/10 Pain location: left ankle (anterior portion) Pain description: "it just hurts" Aggravating factors: standing Relieving factors: nothing  PRECAUTIONS: Fall  RED FLAGS: None - pt has urge incontinence  WEIGHT BEARING RESTRICTIONS: No  FALLS: Has patient fallen in last 6 months? Yes. 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  Daughter-in-law endorses move to her home is long-term.  OBJECTIVE:   DIAGNOSTIC FINDINGS: No recent relevant imaging.  COGNITION: Overall cognitive status: History of cognitive impairments - at baseline-pt has STM deficits, increased processing time, and delayed recall related to his dementia   SENSATION: Light touch: WFL and N/T from ankle line down into toes  COORDINATION: LE RAMS:  WFL Heel-to-shin:  WFL bilaterally  EDEMA:  None noted in BLE  MUSCLE TONE: None noted in BLE, no clonus present bilaterally  POSTURE: forward head and weight shift right  LOWER EXTREMITY ROM:     Active  Right Eval Left Eval  Hip flexion Grossly WFL  Hip extension   Hip abduction   Hip adduction   Hip internal rotation   Hip external rotation   Knee flexion   Knee extension   Ankle dorsiflexion   Ankle plantarflexion   Ankle inversion    Ankle eversion     (Blank rows = not tested)  LOWER EXTREMITY MMT:    MMT Right Eval Left Eval  Hip flexion    Hip extension    Hip abduction    Hip adduction    Hip internal rotation    Hip external rotation    Knee flexion    Knee extension    Ankle dorsiflexion    Ankle plantarflexion    Ankle inversion    Ankle eversion    (Blank rows = not tested)  BED MOBILITY:  Sit to supine Complete Independence Supine to sit Complete Independence Rolling to Right Complete Independence Rolling to Left Complete Independence  TRANSFERS: Assistive device utilized: Quad cane small base  Sit to stand: SBA and CGA Stand to sit: SBA and CGA Chair to chair: SBA and CGA Floor:  varies based on patient cognition and positional preference (pt prefers posterior bump vs quadruped crawl)  GAIT: Gait pattern: step to pattern, step through pattern, decreased arm swing- Right, decreased arm swing- Left, decreased stride length, shuffling, and narrow BOS Distance walked: various  clinic distances Assistive device utilized: Quad cane small base Level of assistance: SBA and CGA Comments: Patient demonstrates progressive decrease in step size, but not like that of a festination pattern.  He tends to carry the cane vs using it creating trip hazard.  PT attempted to cue patient for gait pattern at end of session w/ inconsistent response  to intervention.  Did discuss with patient and daughter the cognitive load of using a cane and sometimes creating a fall risk due to these difficulties, but did not discourage trying to utilize safe gait pattern with NBQC option at home until further assessment and gait training can be completed.  FUNCTIONAL TESTS:  5 times sit to stand: 21.90 seconds w/ BUE support, pt requires repeat simple commands Timed up and go (TUG): To be assessed. 10 meter walk test: To be assessed. Berg Balance Scale: To be assessed.  PATIENT SURVEYS:  None completed due to cognitive demand.  TODAY'S TREATMENT:                                                                                                                              DATE: 05/21/2023 -Briefly assessed left ankle by having patient doff shoes and socks.  He has some light bruising in the lower medial malleolar area that is not tender to palpation and patient and daughter-in-law state they were unaware this was there.  No other discoloration noted.  Patient is able to actively move the foot into inversion/eversion with anterior ankle pain with eversion, and DF/PF without issue.  He can ambulate without notable antalgic gait or stance instability on left.  6 Blaze pods on random one color taps setting for improved reaction time and visual scanning.  Performed on 2 minute intervals with 15 second rest periods.  Pt requires SBA guarding. Round 1:  Using BUE support pt performs lateral stepping on firm surface in // bars.  53 hits. Round 2:  Same as prior, but forward and backward stepping using LLE to tap.   41 hits. Round 3:  Same as initial, but forward and backward stepping using RLE to tap.  47 hits. Notable errors/deficits:  Cued to promote obstacle approximation for better SLS control.   6 Blaze pods on visual scanning and distraction setting for improved dual tasking.  Performed on 2 minute intervals with 15 second rest periods.  Pt requires SBA guarding. Round 1:  Lateral stepping using BUE support in // bars on firm surface w/ single distracting color and single target color setup.  60 hits. Round 2:  Same setup as prior, but added blue mat surface.  56 hits. Round 3:  Same as prior setup.  54 hits. Notable errors/deficits:  Cued for improved approximation to target to prevent LOB and to promote repetition with increased LE clearance.  Prompted for target color once at onset of each round with patient unable to recall color, but once informed he retains without error for each round.  -Clutter cleanup using SPC to ambulate into cluttered area and pick up birdies, large and small cones, and cloths to simulate home tasks.  Patient tries to use cane to slide objects closer to him and into piles, PT corrects to target task for floor retrieval.  No LOB with task at SBA level.  PATIENT EDUCATION: Education details: Encouraged  PCP follow-up if bruising or pain worsens over the next couple of days.  Continue HEP/walking program if able to safely in respect to recent ankle pain. Person educated: Patient and Caregiver daughter-in-law Arline Asp Education method: Explanation and Handouts Education comprehension: verbalized understanding and needs further education  HOME EXERCISE PROGRAM: Access Code: [email or web address] URL: [email or web address] Date: 04/30/2023 Prepared by: Camille Bal  Exercises - Sit to Stand with Armchair  - 1 x daily - 7 x weekly - 2 sets - 6 reps - Standing March with Counter Support  - 1 x daily - 7 x weekly - 2 sets - 20 reps - Standing with Head Rotation  - 1 x daily  - 7 x weekly - 3 sets - 10 reps - Feet Together Balance at [employer] Eyes Open  - 1 x daily - 7 x weekly - 1 sets - 3 reps - 20-30 seconds hold - Corner Balance Feet Together With Eyes Closed  - 1 x daily - 7 x weekly - 1 sets - 3 reps - 15-20 seconds hold - Standing Tandem Balance with Counter Support  - 1 x daily - 7 x weekly - 1 sets - 4 reps - 30 seconds hold  Provided fall prevention handout 04/30/2023.  You Can Walk For A Certain Length Of Time Each Day (with family and use AD for safe endurance)                          Walk 4 minutes 3 times per day.             Increase 1-2  minutes every week.             Work up to 15 minutes (1-2 times per day).               Example:                         Day 1-2           4-5 minutes     3 times per day                         Day 7-8           10-12 minutes 2-3 times per day                         Day 13-14       20-22 minutes 1-2 times per day  GOALS: Goals reviewed with patient? Yes  SHORT TERM GOALS: Target date: 05/18/2023  Patient and caregiver to be able to verbalize fall prevention strategies in order to promote safety in home environment. Baseline:  Initiate conversation at eval w/ handout to be provided. Goal status: INITIAL  2.  Patient to perform floor recovery using quadruped method vs posterior bump at no more than modA level in order to improve management at home in the event of a fall. Baseline: Assist varies, pt prefers posterior bump method. Goal status: INITIAL  3.  Pt will ambulate >/=250 feet over level surfaces with proper sequencing of appropriate LRAD and no more than SBA level of assist to promote improved independence with household and community access. Baseline: Various clinic distances w/ mostly CGA due to improper use of cane Goal status: INITIAL  4.  Pt will decrease 5xSTS to </= 17.90 seconds in  order to demonstrate decreased risk for falls and improved functional bilateral LE strength and  power. Baseline: 21.90 seconds w/ BUE support Goal status: INITIAL  LONG TERM GOALS: Target date: 06/15/2023  Pt will be independent and compliant with BLE strengthening and balance focused HEP in order to maintain functional progress and improve mobility. Baseline: To be established. Goal status: INITIAL  2.  Patient will be compliant to formal walking program >/= 4 days per week w/ supervision from family to improve aerobic tolerance and ambulatory mechanics w/ appropriate AD. Baseline: To be established. Goal status: INITIAL  3.  Pt will decrease 5xSTS to </= 13.90 seconds in order to demonstrate decreased risk for falls and improved functional bilateral LE strength and power. Baseline: 21.90 seconds w/ BUE support Goal status: INITIAL  4.  Pt will demonstrate TUG of </=23.53 seconds in order to decrease risk of falls and improve functional mobility using LRAD. Baseline: 28.53 seconds w/ SBQC (7/30) Goal status: INITIAL  5.  Pt will demonstrate a gait speed of >/=2.02 feet/sec in order to decrease risk for falls. Baseline: 0.55 m/sec OR 1.82 ft/sec (7/30) Goal status: INITIAL  6.  Pt will increase BERG balance score to >/=36/56 to demonstrate improved static balance. Baseline: 32/56 (7/30) Goal status: INITIAL  ASSESSMENT:  CLINICAL IMPRESSION: Was unable to assess STGs this session as EPIC was down during session.  Time spent addressing functional safety, dual tasking, direction changing and reaction time in dynamic context.  Patient remains challenged by decreased step length and clearance.  He does ambulate better with a SPC compared to prior [facility] so recommended continued use of this option.  He continues to benefit from skilled PT to address safety to promote maintained independence with upright mobility.  OBJECTIVE IMPAIRMENTS: Abnormal gait, decreased activity tolerance, decreased balance, decreased cognition, difficulty walking, impaired sensation, improper body mechanics,  and postural dysfunction.   ACTIVITY LIMITATIONS: carrying, lifting, bending, standing, squatting, stairs, transfers, reach over head, and locomotion level  PARTICIPATION LIMITATIONS: meal prep, cleaning, laundry, driving, shopping, and community activity  PERSONAL FACTORS: Age, Fitness, Past/current experiences, Time since onset of injury/illness/exacerbation, and 1-2 comorbidities: dementia, neuropathy  are also affecting patient's functional outcome.   REHAB POTENTIAL: Fair See PMH and personal factors  CLINICAL DECISION MAKING: Evolving/moderate complexity  EVALUATION COMPLEXITY: Moderate  PLAN:  PT FREQUENCY: 1x/week  PT DURATION: 8 weeks  PLANNED INTERVENTIONS: Therapeutic exercises, Therapeutic activity, Neuromuscular re-education, Balance training, Gait training, Patient/Family education, Self Care, Stair training, Vestibular training, DME instructions, and Re-evaluation  PLAN FOR NEXT SESSION:  Add to HEP for bilateral LE strength and balance.  Gait training w/ SPC, Looking, bending, and turning/direction changing - TUG trials w/ distracting blaze pods.  Floor recovery.  ASSESS STGs!  Sadie Haber, PT, DPT 05/21/2023, 5:52 PM

## 2023-05-29 ENCOUNTER — Encounter: Payer: Self-pay | Admitting: Physical Therapy

## 2023-05-29 ENCOUNTER — Ambulatory Visit: Payer: PPO | Attending: Podiatry | Admitting: Physical Therapy

## 2023-05-29 VITALS — BP 129/77 | HR 74

## 2023-05-29 DIAGNOSIS — Z9181 History of falling: Secondary | ICD-10-CM | POA: Diagnosis not present

## 2023-05-29 DIAGNOSIS — R200 Anesthesia of skin: Secondary | ICD-10-CM | POA: Diagnosis not present

## 2023-05-29 DIAGNOSIS — R2681 Unsteadiness on feet: Secondary | ICD-10-CM | POA: Diagnosis not present

## 2023-05-29 DIAGNOSIS — R2689 Other abnormalities of gait and mobility: Secondary | ICD-10-CM | POA: Diagnosis not present

## 2023-05-29 DIAGNOSIS — M6281 Muscle weakness (generalized): Secondary | ICD-10-CM | POA: Insufficient documentation

## 2023-05-29 NOTE — Therapy (Signed)
OUTPATIENT PHYSICAL THERAPY NEURO TREATMENT   Patient Name: Cameron King MRN: [id number] DOB:12-29-1945, 77 y.o., male Today's Date: 05/29/2023   PCP: Malva Limes, MD REFERRING PROVIDER: Elinor Parkinson, [place]  END OF SESSION:   PT End of Session - 05/29/23 1419     Visit Number 6    Number of Visits 9   8 + eval   Date for PT Re-Evaluation 06/22/23   pushed out due to scheduling delay   Authorization Type [employer]    PT Start Time 1411   PT ran over with pt prior   PT Stop Time 1455    PT Time Calculation (min) 44 min    Equipment Utilized During Treatment Gait belt    Activity Tolerance Patient tolerated treatment well    Behavior During Therapy [facility] for tasks assessed/performed            Past Medical History:  Diagnosis Date   Depression    High cholesterol    Hypertension    Meniere disease    Ocular Migraines    Shingles 07/16/2015   of eye    Past Surgical History:  Procedure Laterality Date   COLONOSCOPY     COLONOSCOPY WITH PROPOFOL N/A 10/29/2018   Procedure: COLONOSCOPY WITH PROPOFOL;  Surgeon: Midge Minium, MD;  Location: [facility] ENDOSCOPY;  Service: Endoscopy;  Laterality: N/A;   SIGMOIDOSCOPY  2008   Dr. Evette Cristal. Normal per patient report   Patient Active Problem List   Diagnosis Date Noted   Anxiety, mild 10/04/2021   Difficulty walking 10/04/2021   Hearing deficit, bilateral 10/04/2021   Numbness and tingling 10/04/2021   Sensory ataxia 10/04/2021   Numbness and tingling of both feet 02/08/2021   GAD (generalized anxiety disorder) 02/04/2020   Confusion 02/04/2020   History of colonic polyps    Chronic pain of left ankle 10/07/2018   Bilateral hearing loss 08/21/2018   Cerebral ventriculomegaly 08/21/2018   Chronic pain of right knee 10/02/2016   Enthesopathy of hip 08/02/2009   Rotator cuff syndrome 10/04/2008   Chronic infection of sinus 03/30/2008   HLD (hyperlipidemia) 04/09/2007   Former smoker, stopped smoking in distant  past 11/23/2005   Allergic rhinitis 08/23/2005   Depressive disorder 08/23/2005   Impotence of organic origin 08/23/2005   Migraine without status migrainosus 08/23/2005    ONSET DATE: a couple years ago  REFERRING DIAG: R20.0,R20.2 (ICD-10-CM) - Numbness and tingling of both feet R26.9 (ICD-10-CM) - Abnormality of gait R26.89 (ICD-10-CM) - Balance problem Z91.81 (ICD-10-CM) - At high risk for falls  THERAPY DIAG:  History of falling  Unsteadiness on feet  Muscle weakness (generalized)  Other abnormalities of gait and mobility  Rationale for Evaluation and Treatment: Rehabilitation  SUBJECTIVE:  SUBJECTIVE STATEMENT: He ambulates in using new SPC somewhat slower today than last.  Daughter-in-law endorses some increased hearing difficulty recently as pt does not respond much to therapist speaking to him initially.  She denies falls recently.  Pt accompanied by: self and family member-daughter-in-law [name]  PERTINENT HISTORY: Depression, cerebral ventriculomegaly, HLD, dementia, bilateral hearing loss, GAD, neuropathy  PAIN:  Are you having pain? Yes: NPRS scale: 3/10 Pain location: bottoms of both feet Pain description: "my feet are mushy"; tingly Aggravating factors: all the time Relieving factors: nothing  PRECAUTIONS: Fall  RED FLAGS: None - pt has urge incontinence  WEIGHT BEARING RESTRICTIONS: No  FALLS: Has patient fallen in last 6 months? Yes. 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  Daughter-in-law endorses move to her home is long-term.  OBJECTIVE:   DIAGNOSTIC FINDINGS: No recent relevant imaging.  COGNITION: Overall cognitive status: History of cognitive impairments - at baseline-pt has STM deficits, increased processing time, and delayed recall related to his dementia   SENSATION: Light touch: WFL and N/T from ankle line down into toes  COORDINATION: LE RAMS:  WFL Heel-to-shin:  WFL bilaterally  EDEMA:  None noted in BLE  MUSCLE TONE: None noted in BLE, no clonus present bilaterally  POSTURE: forward head and weight shift right  LOWER EXTREMITY ROM:     Active  Right Eval Left Eval  Hip flexion Grossly WFL  Hip extension   Hip abduction   Hip adduction   Hip internal rotation   Hip external rotation   Knee flexion   Knee extension   Ankle dorsiflexion   Ankle plantarflexion   Ankle inversion    Ankle eversion     (Blank rows = not tested)  LOWER EXTREMITY MMT:    MMT Right Eval Left Eval  Hip flexion    Hip extension    Hip abduction    Hip adduction    Hip internal rotation    Hip external rotation    Knee flexion    Knee extension    Ankle dorsiflexion    Ankle plantarflexion    Ankle inversion    Ankle eversion    (Blank rows = not tested)  BED MOBILITY:  Sit to supine Complete Independence Supine to sit Complete Independence Rolling to Right Complete Independence Rolling to Left Complete Independence  TRANSFERS: Assistive device utilized: Quad cane small base  Sit to stand: SBA and CGA Stand to sit: SBA and CGA Chair to chair: SBA and CGA Floor:  varies based on patient cognition and positional preference (pt prefers posterior bump vs quadruped crawl)  GAIT: Gait pattern: step to pattern, step through pattern, decreased arm swing- Right, decreased arm swing- Left, decreased stride length, shuffling, and narrow  BOS Distance walked: various clinic distances Assistive device utilized: Quad cane small base Level of assistance: SBA and CGA Comments: Patient demonstrates progressive decrease in step size, but not like that of a festination pattern.  He tends to carry the cane vs using it creating trip hazard.  PT attempted to cue patient for gait pattern at end of session w/ inconsistent response to intervention.  Did  discuss with patient and daughter the cognitive load of using a cane and sometimes creating a fall risk due to these difficulties, but did not discourage trying to utilize safe gait pattern with NBQC option at home until further assessment and gait training can be completed.  FUNCTIONAL TESTS:  5 times sit to stand: 21.90 seconds w/ BUE support, pt requires repeat simple commands Timed up and go (TUG): To be assessed. 10 meter walk test: To be assessed. Berg Balance Scale: To be assessed.  PATIENT SURVEYS:  None completed due to cognitive demand.  TODAY'S TREATMENT:                                                                                                                              DATE: 05/29/2023 -Caregiver has paper and has tried to follow-up on annual vision check and has completed other items as suggested on checklist provided (de-cluttering pathways, medication review, night lights, etc.) Floor Recovery: Patient educated in floor recovery this visit using return demo and teach-back for injury assessment and sequencing of task in clinic setting.  Discussion of transfer of skills to variable scenarios outside the clinic.  Patient has most difficulty with fluidity of sequencing.  Performed 1 times. Caregiver Training:  Caregiver present: Daughter-in-law; discussed various methods of having patient recover from fall (butt bump vs quadruped vs resorting to +2 to lift), non-emergency numbers to contact when needing assistance if pt fell in difficult place like bathtub, discussed floor  recovery from tub using towel for knee comfort and cuing patient for sequencing into quadruped with turn to sit on shower bench or side of tub - did encourage them to call 911 in event of injury or if pt is refusing their help but cannot recover safely at independent level and he has been down for prolonged period of time .   Level of Assist:  Verbal/tactile cues and SBA.   -5xSTS:  14.31 sec no UE support SBA-CGA due to quick anterior translation into standing, but no severe sway or LOB -Pt ambulate 345 ft SBA-CGA w/ 3 instances of alternating foot scuff, last scuff resulting in mild forward stumble with tiny steps and CGA to regain balance.  Pt appears to be having a worse day for ambulation than last session and discussed with patient and caregiver the likely need to trial rollator vs RW for use on worse days or unfamiliar or severely unlevel environments.  Pt is agreeable to this plan.  PATIENT EDUCATION: Education details: Progress towards goals.  Discussed potential benefit of visual charts for schedules during the day to establish medication routine and improve medication compliance (pt not always taking gabapentin as scheduled) and for having pt remember to bring cane/AD and airtag (family already using this due to patient's prior falls and leaving home without notifying family) so family can locate him in the event he wanders off.  Discussed likelihood of patient not being able to recall injury assessment or calling  for help in event of a fall, but how they could try life alert or simpler version of notification that patient may be able to automatically perform. Person educated: Patient and Caregiver daughter-in-law Arline Asp Education method: Explanation and Handouts Education comprehension: verbalized understanding and needs further education  HOME EXERCISE PROGRAM: Access Code: [email or web address] URL: [email or web address] Date: 04/30/2023 Prepared by: Camille Bal  Exercises - Sit  to Stand with Armchair  - 1 x daily - 7 x weekly - 2 sets - 6 reps - Standing March with Counter Support  - 1 x daily - 7 x weekly - 2 sets - 20 reps - Standing with Head Rotation  - 1 x daily - 7 x weekly - 3 sets - 10 reps - Feet Together Balance at [employer] Eyes Open  - 1 x daily - 7 x weekly - 1 sets - 3 reps - 20-30 seconds hold - Corner Balance Feet Together With Eyes Closed  - 1 x daily - 7 x weekly - 1 sets - 3 reps - 15-20 seconds hold - Standing Tandem Balance with Counter Support  - 1 x daily - 7 x weekly - 1 sets - 4 reps - 30 seconds hold  Provided fall prevention handout 04/30/2023.  You Can Walk For A Certain Length Of Time Each Day (with family and use AD for safe endurance)                          Walk 4 minutes 3 times per day.             Increase 1-2  minutes every week.             Work up to 15 minutes (1-2 times per day).               Example:                         Day 1-2           4-5 minutes     3 times per day                         Day 7-8           10-12 minutes 2-3 times per day                         Day 13-14       20-22 minutes 1-2 times per day  GOALS: Goals reviewed with patient? Yes  SHORT TERM GOALS: Target date: 05/18/2023  Patient and caregiver to be able to verbalize fall prevention strategies in order to promote safety in home environment. Baseline:  Caregiver has paper and is able to verbalize completed checklist at home (9/3) Goal status: MET  2.  Patient to perform floor recovery using quadruped method vs posterior bump at no more than modA level in order to improve management at home in the event of a fall. Baseline: SBA w/ cues for sequencing (9/3) Goal status: PARTIALLY MET  3.  Pt will ambulate >/=250 feet over level surfaces with proper sequencing of appropriate LRAD and no more than SBA level of assist to promote improved independence with household and community access. Baseline: Various clinic distances w/ mostly CGA due to  improper use of cane; 345' SPC SBA-CGA (9/3) Goal status: PARTIALLY MET  4.  Pt will decrease 5xSTS to </= 17.90 seconds in order to demonstrate decreased risk for falls and improved functional bilateral LE strength and power. Baseline: 21.90 seconds w/ BUE support; 14.31 seconds w/o UE support (9/3) Goal status: MET  LONG TERM GOALS: Target date: 06/15/2023  Pt will be independent and compliant with BLE strengthening and balance focused HEP in order to maintain functional progress and improve mobility. Baseline: To be established. Goal status: INITIAL  2.  Patient will be compliant to formal walking program >/= 4 days per week w/ supervision from family to improve aerobic tolerance and ambulatory mechanics w/ appropriate AD. Baseline: To be established. Goal status: INITIAL  3.  Pt will decrease 5xSTS to </= 13.90 seconds in order to demonstrate decreased risk for falls and improved functional bilateral LE strength and power. Baseline: 21.90 seconds w/ BUE support Goal status: INITIAL  4.  Pt will demonstrate TUG of </=23.53 seconds in order to decrease risk of falls and improve functional mobility using LRAD. Baseline: 28.53 seconds w/ SBQC (7/30) Goal status: INITIAL  5.  Pt will demonstrate a gait speed of >/=2.02 feet/sec in order to decrease risk for falls. Baseline: 0.55 m/sec OR 1.82 ft/sec (7/30) Goal status: INITIAL  6.  Pt will increase BERG balance score to >/=36/56 to demonstrate improved static balance. Baseline: 32/56 (7/30) Goal status: INITIAL  ASSESSMENT:  CLINICAL IMPRESSION: Focus of skilled session today on assessing STGs with patient meeting or partially meeting all goals.  Extensive education provided on fall prevention and recovery today with emphasis on ways to improve patient independence with task in various scenarios and setups.  He would likely benefit from trial of rollator and RW at coming sessions to establish routine with alternate AD on days where  cognition is more impacting his safety with current SPC such as today.  Will continue per POC.  OBJECTIVE IMPAIRMENTS: Abnormal gait, decreased activity tolerance, decreased balance, decreased cognition, difficulty walking, impaired sensation, improper body mechanics, and postural dysfunction.   ACTIVITY LIMITATIONS: carrying, lifting, bending, standing, squatting, stairs, transfers, reach over head, and locomotion level  PARTICIPATION LIMITATIONS: meal prep, cleaning, laundry, driving, shopping, and community activity  PERSONAL FACTORS: Age, Fitness, Past/current experiences, Time since onset of injury/illness/exacerbation, and 1-2 comorbidities: dementia, neuropathy  are also affecting patient's functional outcome.   REHAB POTENTIAL: Fair See PMH and personal factors  CLINICAL DECISION MAKING: Evolving/moderate complexity  EVALUATION COMPLEXITY: Moderate  PLAN:  PT FREQUENCY: 1x/week  PT DURATION: 8 weeks  PLANNED INTERVENTIONS: Therapeutic exercises, Therapeutic activity, Neuromuscular re-education, Balance training, Gait training, Patient/Family education, Self Care, Stair training, Vestibular training, DME instructions, and Re-evaluation  PLAN FOR NEXT SESSION:  Add to HEP for bilateral LE strength and balance.  Gait training w/ rollator vs RW - place order request as needed, Looking, bending, and turning/direction changing - TUG trials w/ distracting blaze pods.    Sadie Haber, PT, DPT 05/29/2023, 3:24 PM

## 2023-06-04 ENCOUNTER — Other Ambulatory Visit: Payer: Self-pay | Admitting: Family Medicine

## 2023-06-04 DIAGNOSIS — E7800 Pure hypercholesterolemia, unspecified: Secondary | ICD-10-CM

## 2023-06-05 ENCOUNTER — Ambulatory Visit: Payer: PPO | Admitting: Physical Therapy

## 2023-06-12 ENCOUNTER — Encounter: Payer: Self-pay | Admitting: Physical Therapy

## 2023-06-12 ENCOUNTER — Ambulatory Visit: Payer: PPO | Admitting: Physical Therapy

## 2023-06-12 DIAGNOSIS — Z9181 History of falling: Secondary | ICD-10-CM

## 2023-06-12 DIAGNOSIS — M6281 Muscle weakness (generalized): Secondary | ICD-10-CM

## 2023-06-12 DIAGNOSIS — R2681 Unsteadiness on feet: Secondary | ICD-10-CM

## 2023-06-12 DIAGNOSIS — R2689 Other abnormalities of gait and mobility: Secondary | ICD-10-CM

## 2023-06-12 NOTE — Therapy (Unsigned)
OUTPATIENT PHYSICAL THERAPY NEURO TREATMENT   Patient Name: Cameron King MRN: [id number] DOB:1946-03-06, 77 y.o., male Today's Date: 06/12/2023   PCP: Malva Limes, MD REFERRING PROVIDER: Elinor Parkinson, [place]  END OF SESSION:   PT End of Session - 06/12/23 1409     Visit Number 7    Number of Visits 9   8 + eval   Date for PT Re-Evaluation 06/22/23   pushed out due to scheduling delay   Authorization Type [employer]    PT Start Time 1404    PT Stop Time 1449    PT Time Calculation (min) 45 min    Equipment Utilized During Treatment Gait belt    Activity Tolerance Patient tolerated treatment well    Behavior During Therapy WFL for tasks assessed/performed            Past Medical History:  Diagnosis Date   Depression    High cholesterol    Hypertension    Meniere disease    Ocular Migraines    Shingles 07/16/2015   of eye    Past Surgical History:  Procedure Laterality Date   COLONOSCOPY     COLONOSCOPY WITH PROPOFOL N/A 10/29/2018   Procedure: COLONOSCOPY WITH PROPOFOL;  Surgeon: Midge Minium, MD;  Location: [facility] ENDOSCOPY;  Service: Endoscopy;  Laterality: N/A;   SIGMOIDOSCOPY  2008   Dr. Evette Cristal. Normal per patient report   Patient Active Problem List   Diagnosis Date Noted   Anxiety, mild 10/04/2021   Difficulty walking 10/04/2021   Hearing deficit, bilateral 10/04/2021   Numbness and tingling 10/04/2021   Sensory ataxia 10/04/2021   Numbness and tingling of both feet 02/08/2021   GAD (generalized anxiety disorder) 02/04/2020   Confusion 02/04/2020   History of colonic polyps    Chronic pain of left ankle 10/07/2018   Bilateral hearing loss 08/21/2018   Cerebral ventriculomegaly 08/21/2018   Chronic pain of right knee 10/02/2016   Enthesopathy of hip 08/02/2009   Rotator cuff syndrome 10/04/2008   Chronic infection of sinus 03/30/2008   HLD (hyperlipidemia) 04/09/2007   Former smoker, stopped smoking in distant past 11/23/2005    Allergic rhinitis 08/23/2005   Depressive disorder 08/23/2005   Impotence of organic origin 08/23/2005   Migraine without status migrainosus 08/23/2005    ONSET DATE: a couple years ago  REFERRING DIAG: R20.0,R20.2 (ICD-10-CM) - Numbness and tingling of both feet R26.9 (ICD-10-CM) - Abnormality of gait R26.89 (ICD-10-CM) - Balance problem Z91.81 (ICD-10-CM) - At high risk for falls  THERAPY DIAG:  History of falling  Unsteadiness on feet  Muscle weakness (generalized)  Other abnormalities of gait and mobility  Rationale for Evaluation and Treatment: Rehabilitation  SUBJECTIVE:  SUBJECTIVE STATEMENT: He had a recent illness last week that made him briefly dizzy and tired and he continues to feel somewhat fatigued as he states he didn't want to move much during this.  Daughter-in-law states she difficulty remains with eyes closed exercises.  She denies falls recently.  Pt accompanied by: self and family member-daughter-in-law [name]  PERTINENT HISTORY: Depression, cerebral ventriculomegaly, HLD, dementia, bilateral hearing loss, GAD, neuropathy  PAIN:  Are you having pain? No  PRECAUTIONS: Fall  RED FLAGS: None - pt has urge incontinence  WEIGHT BEARING RESTRICTIONS: No  FALLS: Has patient fallen in last 6 months? Yes. 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  Daughter-in-law endorses move to her home is  long-term.  OBJECTIVE:   DIAGNOSTIC FINDINGS: No recent relevant imaging.  COGNITION: Overall cognitive status: History of cognitive impairments - at baseline-pt has STM deficits, increased processing time, and delayed recall related to his dementia   SENSATION: Light touch: WFL and N/T from ankle line down into toes  COORDINATION: LE RAMS:  WFL Heel-to-shin:  WFL bilaterally  EDEMA:  None noted in BLE  MUSCLE TONE: None noted in BLE, no clonus present bilaterally  POSTURE: forward head and weight shift right  LOWER EXTREMITY ROM:     Active  Right Eval Left Eval  Hip flexion Grossly WFL  Hip extension   Hip abduction   Hip adduction   Hip internal rotation   Hip external rotation   Knee flexion   Knee extension   Ankle dorsiflexion   Ankle plantarflexion   Ankle inversion    Ankle eversion     (Blank rows = not tested)  LOWER EXTREMITY MMT:    MMT Right Eval Left Eval  Hip flexion    Hip extension    Hip abduction    Hip adduction    Hip internal rotation    Hip external rotation    Knee flexion    Knee extension    Ankle dorsiflexion    Ankle plantarflexion    Ankle inversion    Ankle eversion    (Blank rows = not tested)  BED MOBILITY:  Sit to supine Complete Independence Supine to sit Complete Independence Rolling to Right Complete Independence Rolling to Left Complete Independence  TRANSFERS: Assistive device utilized: Quad cane small base  Sit to stand: SBA and CGA Stand to sit: SBA and CGA Chair to chair: SBA and CGA Floor:  varies based on patient cognition and positional preference (pt prefers posterior bump vs quadruped crawl)  GAIT: Gait pattern: step to pattern, step through pattern, decreased arm swing- Right, decreased arm swing- Left, decreased stride length, shuffling, and narrow BOS Distance walked: various clinic distances Assistive device utilized: Quad cane small base Level of assistance: SBA and CGA Comments: Patient  demonstrates progressive decrease in step size, but not like that of a festination pattern.  He tends to carry the cane vs using it creating trip hazard.  PT attempted to cue patient for gait pattern at end of session w/ inconsistent response to intervention.  Did discuss with patient and daughter the cognitive load of using a cane and  sometimes creating a fall risk due to these difficulties, but did not discourage trying to utilize safe gait pattern with NBQC option at home until further assessment and gait training can be completed.  FUNCTIONAL TESTS:  5 times sit to stand: 21.90 seconds w/ BUE support, pt requires repeat simple commands Timed up and go (TUG): To be assessed. 10 meter walk test: To be assessed. Berg Balance Scale: To be assessed.  PATIENT SURVEYS:  None completed due to cognitive demand.  TODAY'S TREATMENT:                                                                                                                              DATE: 06/12/2023 -Discussed re-cert vs discharge w/ pt and caregiver in agreement to revisit discussion upon goal assessment. GAIT: Gait pattern: step to pattern, step through pattern, decreased arm swing- Right, decreased arm swing- Left, decreased stride length, shuffling, and narrow BOS Distance walked: 430 ft + 200 ft + 230 ft Assistive device utilized: Rollator, RW Level of assistance: SBA Comments: Patient repeatedly running into left oriented obstacles w/ 50% need for cues to correct to path when using RW.  He tends to pick up RW to back up and increase speed vs with rollator more fluidity of gait noted, improved turn radius, and good carryover of brake management during second trial.  PATIENT EDUCATION: Education details: Discussed likelihood of re-cert at next visit, benefit of further rollator trials and order placed at future visit after assessing more scenarios.  Continue using cane and likely benefit of using cane vs rollator in certain  scenarios at home depending on task demand.  Goal of automatic gait w/ AD and noted deficits w/ RW vs rollator and possible reasons for this. Person educated: Patient and Caregiver daughter-in-law Arline Asp Education method: Explanation and Handouts Education comprehension: verbalized understanding and needs further education  HOME EXERCISE PROGRAM: Access Code: [email or web address] URL: [email or web address] Date: 04/30/2023 Prepared by: Camille Bal  Exercises - Sit to Stand with Armchair  - 1 x daily - 7 x weekly - 2 sets - 6 reps - Standing March with Counter Support  - 1 x daily - 7 x weekly - 2 sets - 20 reps - Standing with Head Rotation  - 1 x daily - 7 x weekly - 3 sets - 10 reps - Feet Together Balance at [employer] Eyes Open  - 1 x daily - 7 x weekly - 1 sets - 3 reps - 20-30 seconds hold - Corner Balance Feet Together With Eyes Closed  - 1 x daily - 7 x weekly - 1 sets - 3 reps - 15-20 seconds hold - Standing Tandem Balance with Counter Support  - 1 x daily - 7 x weekly - 1 sets - 4 reps - 30 seconds hold  Provided fall prevention handout 04/30/2023.  You Can Walk For A Certain Length Of Time Each Day (with family and use AD for safe endurance)  Walk 4 minutes 3 times per day.             Increase 1-2  minutes every week.             Work up to 15 minutes (1-2 times per day).               Example:                         Day 1-2           4-5 minutes     3 times per day                         Day 7-8           10-12 minutes 2-3 times per day                         Day 13-14       20-22 minutes 1-2 times per day  GOALS: Goals reviewed with patient? Yes  SHORT TERM GOALS: Target date: 05/18/2023  Patient and caregiver to be able to verbalize fall prevention strategies in order to promote safety in home environment. Baseline:  Caregiver has paper and is able to verbalize completed checklist at home (9/3) Goal status: MET  2.  Patient to  perform floor recovery using quadruped method vs posterior bump at no more than modA level in order to improve management at home in the event of a fall. Baseline: SBA w/ cues for sequencing (9/3) Goal status: PARTIALLY MET  3.  Pt will ambulate >/=250 feet over level surfaces with proper sequencing of appropriate LRAD and no more than SBA level of assist to promote improved independence with household and community access. Baseline: Various clinic distances w/ mostly CGA due to improper use of cane; 345' SPC SBA-CGA (9/3) Goal status: PARTIALLY MET  4.  Pt will decrease 5xSTS to </= 17.90 seconds in order to demonstrate decreased risk for falls and improved functional bilateral LE strength and power. Baseline: 21.90 seconds w/ BUE support; 14.31 seconds w/o UE support (9/3) Goal status: MET  LONG TERM GOALS: Target date: 06/15/2023  Pt will be independent and compliant with BLE strengthening and balance focused HEP in order to maintain functional progress and improve mobility. Baseline: To be established. Goal status: INITIAL  2.  Patient will be compliant to formal walking program >/= 4 days per week w/ supervision from family to improve aerobic tolerance and ambulatory mechanics w/ appropriate AD. Baseline: To be established. Goal status: INITIAL  3.  Pt will decrease 5xSTS to </= 13.90 seconds in order to demonstrate decreased risk for falls and improved functional bilateral LE strength and power. Baseline: 21.90 seconds w/ BUE support Goal status: INITIAL  4.  Pt will demonstrate TUG of </=23.53 seconds in order to decrease risk of falls and improve functional mobility using LRAD. Baseline: 28.53 seconds w/ SBQC (7/30) Goal status: INITIAL  5.  Pt will demonstrate a gait speed of >/=2.02 feet/sec in order to decrease risk for falls. Baseline: 0.55 m/sec OR 1.82 ft/sec (7/30) Goal status: INITIAL  6.  Pt will increase BERG balance score to >/=36/56 to demonstrate improved static  balance. Baseline: 32/56 (7/30) Goal status: INITIAL  ASSESSMENT:  CLINICAL IMPRESSION: Focus of skilled session today on assessing STGs with patient meeting or partially meeting all goals.  Extensive education provided  on fall prevention and recovery today with emphasis on ways to improve patient independence with task in various scenarios and setups.  He would likely benefit from trial of rollator and RW at coming sessions to establish routine with alternate AD on days where cognition is more impacting his safety with current SPC such as today.  Will continue per POC.  OBJECTIVE IMPAIRMENTS: Abnormal gait, decreased activity tolerance, decreased balance, decreased cognition, difficulty walking, impaired sensation, improper body mechanics, and postural dysfunction.   ACTIVITY LIMITATIONS: carrying, lifting, bending, standing, squatting, stairs, transfers, reach over head, and locomotion level  PARTICIPATION LIMITATIONS: meal prep, cleaning, laundry, driving, shopping, and community activity  PERSONAL FACTORS: Age, Fitness, Past/current experiences, Time since onset of injury/illness/exacerbation, and 1-2 comorbidities: dementia, neuropathy  are also affecting patient's functional outcome.   REHAB POTENTIAL: Fair See PMH and personal factors  CLINICAL DECISION MAKING: Evolving/moderate complexity  EVALUATION COMPLEXITY: Moderate  PLAN:  PT FREQUENCY: 1x/week  PT DURATION: 8 weeks  PLANNED INTERVENTIONS: Therapeutic exercises, Therapeutic activity, Neuromuscular re-education, Balance training, Gait training, Patient/Family education, Self Care, Stair training, Vestibular training, DME instructions, and Re-evaluation  PLAN FOR NEXT SESSION:  ASSESS LTGs- re-cert likely 1x/wk for 4 more weeks.  Practice w/ rollator outside/curb - place order request as needed.  Add to HEP for bilateral LE strength and balance.  Looking, bending, and turning/direction changing - TUG trials w/ distracting  blaze pods.  Eyes closed.  Sadie Haber, PT, DPT 06/12/2023, 2:50 PM

## 2023-06-19 ENCOUNTER — Ambulatory Visit: Payer: PPO | Admitting: Physical Therapy

## 2023-06-19 ENCOUNTER — Encounter: Payer: Self-pay | Admitting: Physical Therapy

## 2023-06-19 DIAGNOSIS — R2689 Other abnormalities of gait and mobility: Secondary | ICD-10-CM

## 2023-06-19 DIAGNOSIS — M6281 Muscle weakness (generalized): Secondary | ICD-10-CM

## 2023-06-19 DIAGNOSIS — Z9181 History of falling: Secondary | ICD-10-CM | POA: Diagnosis not present

## 2023-06-19 DIAGNOSIS — R2681 Unsteadiness on feet: Secondary | ICD-10-CM

## 2023-06-19 NOTE — Therapy (Signed)
OUTPATIENT PHYSICAL THERAPY NEURO TREATMENT/RE-CERT   Patient Name: Cameron King MRN: [id number] DOB:[birth year], 77 y.o., male Today's Date: 06/19/2023   PCP: Malva Limes, MD REFERRING PROVIDER: Elinor Parkinson, [place]  END OF SESSION:   PT End of Session - 06/19/23 1408     Visit Number 8    Number of Visits 13   9 + 4   Date for PT Re-Evaluation 08/03/23   pushed out due to scheduling delay   Authorization Type [employer]    PT Start Time 1404    PT Stop Time 1445    PT Time Calculation (min) 41 min    Equipment Utilized During Treatment Gait belt    Activity Tolerance Patient tolerated treatment well    Behavior During Therapy WFL for tasks assessed/performed            Past Medical History:  Diagnosis Date   Depression    High cholesterol    Hypertension    Meniere disease    Ocular Migraines    Shingles 07/16/2015   of eye    Past Surgical History:  Procedure Laterality Date   COLONOSCOPY     COLONOSCOPY WITH PROPOFOL N/A 10/29/2018   Procedure: COLONOSCOPY WITH PROPOFOL;  Surgeon: Midge Minium, MD;  Location: [facility] ENDOSCOPY;  Service: Endoscopy;  Laterality: N/A;   SIGMOIDOSCOPY  2008   Dr. Evette Cristal. Normal per patient report   Patient Active Problem List   Diagnosis Date Noted   Anxiety, mild 10/04/2021   Difficulty walking 10/04/2021   Hearing deficit, bilateral 10/04/2021   Numbness and tingling 10/04/2021   Sensory ataxia 10/04/2021   Numbness and tingling of both feet 02/08/2021   GAD (generalized anxiety disorder) 02/04/2020   Confusion 02/04/2020   History of colonic polyps    Chronic pain of left ankle 10/07/2018   Bilateral hearing loss 08/21/2018   Cerebral ventriculomegaly 08/21/2018   Chronic pain of right knee 10/02/2016   Enthesopathy of hip 08/02/2009   Rotator cuff syndrome 10/04/2008   Chronic infection of sinus 03/30/2008   HLD (hyperlipidemia) 04/09/2007   Former smoker, stopped smoking in distant past 11/23/2005    Allergic rhinitis 08/23/2005   Depressive disorder 08/23/2005   Impotence of organic origin 08/23/2005   Migraine without status migrainosus 08/23/2005    ONSET DATE: a couple years ago  REFERRING DIAG: R20.0,R20.2 (ICD-10-CM) - Numbness and tingling of both feet R26.9 (ICD-10-CM) - Abnormality of gait R26.89 (ICD-10-CM) - Balance problem Z91.81 (ICD-10-CM) - At high risk for falls  THERAPY DIAG:  History of falling  Unsteadiness on feet  Muscle weakness (generalized)  Other abnormalities of gait and mobility  Rationale for Evaluation and Treatment: Rehabilitation  SUBJECTIVE:  SUBJECTIVE STATEMENT: He reports trouble hearing on right side today.  He denies falls or pain recently.  Pt accompanied by: self and family member-daughter-in-law [name]  PERTINENT HISTORY: Depression, cerebral ventriculomegaly, HLD, dementia, bilateral hearing loss, GAD, neuropathy  PAIN:  Are you having pain? No  PRECAUTIONS: Fall  RED FLAGS: None - pt has urge incontinence  WEIGHT BEARING RESTRICTIONS: No  FALLS: Has patient fallen in last 6 months? Yes. 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  Daughter-in-law endorses move to her home is long-term.  OBJECTIVE:   DIAGNOSTIC FINDINGS: No recent relevant imaging.  COGNITION: Overall cognitive status: History of cognitive impairments - at baseline-pt has STM deficits,  increased processing time, and delayed recall related to his dementia   SENSATION: Light touch: WFL and N/T from ankle line down into toes  COORDINATION: LE RAMS:  WFL Heel-to-shin:  WFL bilaterally  EDEMA:  None noted in BLE  MUSCLE TONE: None noted in BLE, no clonus present bilaterally  POSTURE: forward head and weight shift right  LOWER EXTREMITY ROM:     Active  Right Eval Left Eval  Hip flexion Grossly WFL  Hip extension   Hip abduction   Hip adduction   Hip internal rotation   Hip external rotation   Knee flexion   Knee extension   Ankle dorsiflexion   Ankle plantarflexion   Ankle inversion    Ankle eversion     (Blank rows = not tested)  LOWER EXTREMITY MMT:    MMT Right Eval Left Eval  Hip flexion    Hip extension    Hip abduction    Hip adduction    Hip internal rotation    Hip external rotation    Knee flexion    Knee extension    Ankle dorsiflexion    Ankle plantarflexion    Ankle inversion    Ankle eversion    (Blank rows = not tested)  BED MOBILITY:  Sit to supine Complete Independence Supine to sit Complete Independence Rolling to Right Complete Independence Rolling to Left Complete Independence  TRANSFERS: Assistive device utilized: Quad cane small base  Sit to stand: SBA and CGA Stand to sit: SBA and CGA Chair to chair: SBA and CGA Floor:  varies based on patient cognition and positional preference (pt prefers posterior bump vs quadruped crawl)  GAIT: Gait pattern: step to pattern, step through pattern, decreased arm swing- Right, decreased arm swing- Left, decreased stride length, shuffling, and narrow BOS Distance walked: various clinic distances Assistive device utilized: Quad cane small base Level of assistance: SBA and CGA Comments: Patient demonstrates progressive decrease in step size, but not like that of a festination pattern.  He tends to carry the cane vs using it creating trip hazard.  PT attempted to cue patient for  gait pattern at end of session w/ inconsistent response to intervention.  Did discuss with patient and daughter the cognitive load of using a cane and sometimes creating a fall risk due to these difficulties, but did not discourage trying to utilize safe gait pattern with NBQC option at home until further assessment and gait training can be  completed.  FUNCTIONAL TESTS:  5 times sit to stand: 21.90 seconds w/ BUE support, pt requires repeat simple commands Timed up and go (TUG): To be assessed. 10 meter walk test: To be assessed. Berg Balance Scale: To be assessed.  PATIENT SURVEYS:  None completed due to cognitive demand.  TODAY'S TREATMENT:                                                                                                                              DATE: 06/19/2023 -Verbally reviewed fall prevention, HEP, and walking program (limited due to illness) - caregiver confirms current printouts and compliance/understanding. -5xSTS:  12.62 seconds no UE support w/ single posterior LOB -TUG:  17.15 seconds w/ SPC SBA - :  15.94 seconds w/ SPC = 0.63 m/sec OR 2.07 ft/sec -BERG:  [facility] PT Assessment - 06/19/23 1426       Berg Balance Test   Sit to Stand Able to stand without using hands and stabilize independently   some increased forward translation before standing when not using hands, no notable LOB   Standing Unsupported Able to stand safely 2 minutes    Sitting with Back Unsupported but Feet Supported on Floor or Stool Able to sit safely and securely 2 minutes    Stand to Sit Sits safely with minimal use of hands    Transfers Able to transfer safely, definite need of hands    Standing Unsupported with Eyes Closed Able to stand 10 seconds safely    Standing Unsupported with Feet Together Able to place feet together independently and stand 1 minute safely    From Standing, Reach Forward with Outstretched Arm Can reach forward >12 cm safely (5")   8"   From Standing Position,  Pick up Object from Floor Able to pick up shoe, needs supervision    From Standing Position, Turn to Look Behind Over each Shoulder Looks behind from both sides and weight shifts well    Turn 360 Degrees Able to turn 360 degrees safely but slowly   5 seconds each side   Standing Unsupported, Alternately Place Feet on Step/Stool Able to complete 4 steps without aid or supervision    Standing Unsupported, One Foot in Front Able to take small step independently and hold 30 seconds    Standing on One Leg Tries to lift leg/unable to hold 3 seconds but remains standing independently    Total Score 44    Berg comment: 44/56 = significant fall risk            PATIENT EDUCATION: Education details: Process for re-cert.  Progress towards goals.  Continue walking program and HEP. Person educated: Patient and Caregiver daughter-in-law Arline Asp Education method: Explanation and Handouts Education comprehension: verbalized understanding and needs further education  HOME EXERCISE PROGRAM: Access Code: [email or web address] URL: [email or web address] Date: 04/30/2023 Prepared by: Camille Bal  Exercises - Sit to Stand with Armchair  - 1 x daily - 7 x weekly - 2  sets - 6 reps - Standing March with Counter Support  - 1 x daily - 7 x weekly - 2 sets - 20 reps - Standing with Head Rotation  - 1 x daily - 7 x weekly - 3 sets - 10 reps - Feet Together Balance at [employer] Eyes Open  - 1 x daily - 7 x weekly - 1 sets - 3 reps - 20-30 seconds hold - Corner Balance Feet Together With Eyes Closed  - 1 x daily - 7 x weekly - 1 sets - 3 reps - 15-20 seconds hold - Standing Tandem Balance with Counter Support  - 1 x daily - 7 x weekly - 1 sets - 4 reps - 30 seconds hold  Provided fall prevention handout 04/30/2023.  You Can Walk For A Certain Length Of Time Each Day (with family and use AD for safe endurance)                          Walk 4 minutes 3 times per day.             Increase 1-2  minutes every  week.             Work up to 15 minutes (1-2 times per day).               Example:                         Day 1-2           4-5 minutes     3 times per day                         Day 7-8           10-12 minutes 2-3 times per day                         Day 13-14       20-22 minutes 1-2 times per day  GOALS: Goals reviewed with patient? Yes  SHORT TERM GOALS: Target date: 05/18/2023  Patient and caregiver to be able to verbalize fall prevention strategies in order to promote safety in home environment. Baseline:  Caregiver has paper and is able to verbalize completed checklist at home (9/3) Goal status: MET  2.  Patient to perform floor recovery using quadruped method vs posterior bump at no more than modA level in order to improve management at home in the event of a fall. Baseline: SBA w/ cues for sequencing (9/3) Goal status: PARTIALLY MET  3.  Pt will ambulate >/=250 feet over level surfaces with proper sequencing of appropriate LRAD and no more than SBA level of assist to promote improved independence with household and community access. Baseline: Various clinic distances w/ mostly CGA due to improper use of cane; 345' SPC SBA-CGA (9/3) Goal status: PARTIALLY MET  4.  Pt will decrease 5xSTS to </= 17.90 seconds in order to demonstrate decreased risk for falls and improved functional bilateral LE strength and power. Baseline: 21.90 seconds w/ BUE support; 14.31 seconds w/o UE support (9/3) Goal status: MET  LONG TERM GOALS: Target date: 06/15/2023  Pt will be independent and compliant with BLE strengthening and balance focused HEP in order to maintain functional progress and improve mobility. Baseline: Compliant w/ supervision (9/24) Goal status: IN PROGRESS  2.  Patient will be compliant to formal walking program >/= 4 days per week w/ supervision from family to improve aerobic tolerance and ambulatory mechanics w/ appropriate AD. Baseline: Patient has not been compliant  recently due to caregiver illness (9/24) Goal status: IN PROGRESS  3.  Pt will decrease 5xSTS to </= 13.90 seconds in order to demonstrate decreased risk for falls and improved functional bilateral LE strength and power. Baseline: 21.90 seconds w/ BUE support; 12.62 seconds no UE support w/ single posterior LOB (9/24) Goal status: MET  4.  Pt will demonstrate TUG of </=23.53 seconds in order to decrease risk of falls and improve functional mobility using LRAD. Baseline: 28.53 seconds w/ SBQC (7/30); 17.15 seconds w/ SPC (9/24) Goal status: MET  5.  Pt will demonstrate a gait speed of >/=2.02 feet/sec in order to decrease risk for falls. Baseline: 0.55 m/sec OR 1.82 ft/sec (7/30); 2.07 ft/sec (9/24) Goal status: MET  6.  Pt will increase BERG balance score to >/=36/56 to demonstrate improved static balance. Baseline: 32/56 (7/30);  44/56 (9/24) Goal status: MET  LONG TERM GOALS (at re-cert): Target date: 07/20/2023  Pt will be independent and compliant with BLE strengthening and balance focused HEP in order to maintain functional progress and improve mobility. Baseline: Compliant w/ supervision (9/24) Goal status: IN PROGRESS  2.  Patient will be compliant to formal walking program >/= 4 days per week w/ supervision from family to improve aerobic tolerance and ambulatory mechanics w/ appropriate AD. Baseline: Patient has not been compliant recently due to caregiver illness (9/24) Goal status: IN PROGRESS  3.  PT to further assess rollator use outdoors over unlevel surfaces w/ order request placed as appropriate. Baseline: To be further assessed. Goal status: INITIAL  4.  Pt will demonstrate TUG of </=13 seconds in order to decrease risk of falls and improve functional mobility using LRAD. Baseline: 28.53 seconds w/ SBQC (7/30); 17.15 seconds w/ SPC (9/24) Goal status: REVISED  5.  Pt will demonstrate a gait speed of >/=2.27 feet/sec in order to decrease risk for falls. Baseline:  0.55 m/sec OR 1.82 ft/sec (7/30); 2.07 ft/sec (9/24) Goal status: REVISED  6.  Pt will increase BERG balance score to >/=49/56 to demonstrate improved static balance. Baseline: 32/56 (7/30);  44/56 (9/24) Goal status: REVISED  ASSESSMENT:  CLINICAL IMPRESSION: Assessed LTGs this visit with pt meeting all but HEP and walking program goals due to limited supervision due to recent household illnesses.  He improved his TUG time to 17.15 seconds w/ SPC and his ambulation speed increased to 2.07 ft/sec without unsteadiness.  His BERG most significantly improved to 44/56 today.  Goals were revised as appropriate to reflect current functional level.  PT plans to continue addressing use of rollator for pt safety and benefit in coming sessions.  OBJECTIVE IMPAIRMENTS: Abnormal gait, decreased activity tolerance, decreased balance, decreased cognition, difficulty walking, impaired sensation, improper body mechanics, and postural dysfunction.   ACTIVITY LIMITATIONS: carrying, lifting, bending, standing, squatting, stairs, transfers, reach over head, and locomotion level  PARTICIPATION LIMITATIONS: meal prep, cleaning, laundry, driving, shopping, and community activity  PERSONAL FACTORS: Age, Fitness, Past/current experiences, Time since onset of injury/illness/exacerbation, and 1-2 comorbidities: dementia, neuropathy  are also affecting patient's functional outcome.   REHAB POTENTIAL: [place] PMH and personal factors  CLINICAL DECISION MAKING: Evolving/moderate complexity  EVALUATION COMPLEXITY: Moderate  PLAN:  PT FREQUENCY: 1x/week  PT DURATION: 8 weeks + 4 weeks (at re-cert)  PLANNED INTERVENTIONS: Therapeutic exercises, Therapeutic activity, Neuromuscular re-education, Balance  training, Gait training, Patient/Family education, Self Care, Stair training, Vestibular training, DME instructions, and Re-evaluation  PLAN FOR NEXT SESSION:  Practice w/ rollator outside/curb - place order request  as needed.  Add to HEP for bilateral LE strength and balance, bending, and turning/direction changing - TUG trials w/ distracting blaze pods.  Sadie Haber, PT, DPT 06/19/2023, 6:02 PM

## 2023-06-25 ENCOUNTER — Ambulatory Visit: Payer: PPO | Admitting: Physical Therapy

## 2023-06-25 ENCOUNTER — Encounter: Payer: Self-pay | Admitting: Physical Therapy

## 2023-06-25 DIAGNOSIS — Z9181 History of falling: Secondary | ICD-10-CM | POA: Diagnosis not present

## 2023-06-25 DIAGNOSIS — R2681 Unsteadiness on feet: Secondary | ICD-10-CM

## 2023-06-25 DIAGNOSIS — M6281 Muscle weakness (generalized): Secondary | ICD-10-CM

## 2023-06-25 DIAGNOSIS — R2689 Other abnormalities of gait and mobility: Secondary | ICD-10-CM

## 2023-06-25 DIAGNOSIS — R200 Anesthesia of skin: Secondary | ICD-10-CM

## 2023-06-25 NOTE — Therapy (Signed)
OUTPATIENT PHYSICAL THERAPY NEURO TREATMENT   Patient Name: Cameron King MRN: [id number] DOB:December 13, 1945, 77 y.o., male Today's Date: 06/25/2023   PCP: Malva Limes, MD REFERRING PROVIDER: Elinor Parkinson, [place]  END OF SESSION:   PT End of Session - 06/25/23 1534     Visit Number 9    Number of Visits 13   9 + 4   Date for PT Re-Evaluation 08/03/23   pushed out due to scheduling delay   Authorization Type [employer]    PT Start Time 1532    PT Stop Time 1614    PT Time Calculation (min) 42 min    Equipment Utilized During Treatment Gait belt    Activity Tolerance Patient tolerated treatment well    Behavior During Therapy WFL for tasks assessed/performed            Past Medical History:  Diagnosis Date   Depression    High cholesterol    Hypertension    Meniere disease    Ocular Migraines    Shingles 07/16/2015   of eye    Past Surgical History:  Procedure Laterality Date   COLONOSCOPY     COLONOSCOPY WITH PROPOFOL N/A 10/29/2018   Procedure: COLONOSCOPY WITH PROPOFOL;  Surgeon: Midge Minium, MD;  Location: [facility] ENDOSCOPY;  Service: Endoscopy;  Laterality: N/A;   SIGMOIDOSCOPY  2008   Dr. Evette Cristal. Normal per patient report   Patient Active Problem List   Diagnosis Date Noted   Anxiety, mild 10/04/2021   Difficulty walking 10/04/2021   Hearing deficit, bilateral 10/04/2021   Numbness and tingling 10/04/2021   Sensory ataxia 10/04/2021   Numbness and tingling of both feet 02/08/2021   GAD (generalized anxiety disorder) 02/04/2020   Confusion 02/04/2020   History of colonic polyps    Chronic pain of left ankle 10/07/2018   Bilateral hearing loss 08/21/2018   Cerebral ventriculomegaly 08/21/2018   Chronic pain of right knee 10/02/2016   Enthesopathy of hip 08/02/2009   Rotator cuff syndrome 10/04/2008   Chronic infection of sinus 03/30/2008   HLD (hyperlipidemia) 04/09/2007   Former smoker, stopped smoking in distant past 11/23/2005   Allergic  rhinitis 08/23/2005   Depressive disorder 08/23/2005   Impotence of organic origin 08/23/2005   Migraine without status migrainosus 08/23/2005    ONSET DATE: a couple years ago  REFERRING DIAG: R20.0,R20.2 (ICD-10-CM) - Numbness and tingling of both feet R26.9 (ICD-10-CM) - Abnormality of gait R26.89 (ICD-10-CM) - Balance problem Z91.81 (ICD-10-CM) - At high risk for falls  THERAPY DIAG:  History of falling  Unsteadiness on feet  Muscle weakness (generalized)  Other abnormalities of gait and mobility  Anesthesia of skin  Rationale for Evaluation and Treatment: Rehabilitation  SUBJECTIVE:  SUBJECTIVE STATEMENT: He states he is having some fullness in the right ear and they think it might be an ear infection.  Arline Asp states it seems to come and go.  He denies falls or pain recently.  Pt accompanied by: self and family member-daughter-in-law [name]  PERTINENT HISTORY: Depression, cerebral ventriculomegaly, HLD, dementia, bilateral hearing loss, GAD, neuropathy  PAIN:  Are you having pain? No  PRECAUTIONS: Fall  RED FLAGS: None - pt has urge incontinence  WEIGHT BEARING RESTRICTIONS: No  FALLS: Has patient fallen in last 6 months? Yes. 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  Daughter-in-law endorses move to her home is long-term.  OBJECTIVE:   DIAGNOSTIC FINDINGS: No recent relevant  imaging.  COGNITION: Overall cognitive status: History of cognitive impairments - at baseline-pt has STM deficits, increased processing time, and delayed recall related to his dementia   SENSATION: Light touch: WFL and N/T from ankle line down into toes  COORDINATION: LE RAMS:  WFL Heel-to-shin:  WFL bilaterally  EDEMA:  None noted in BLE  MUSCLE TONE: None noted in BLE, no clonus present bilaterally  POSTURE: forward head and weight shift right  LOWER EXTREMITY ROM:     Active  Right Eval Left Eval  Hip flexion Grossly WFL  Hip extension   Hip abduction   Hip adduction   Hip internal rotation   Hip external rotation   Knee flexion   Knee extension   Ankle dorsiflexion   Ankle plantarflexion   Ankle inversion    Ankle eversion     (Blank rows = not tested)  LOWER EXTREMITY MMT:    MMT Right Eval Left Eval  Hip flexion    Hip extension    Hip abduction    Hip adduction    Hip internal rotation    Hip external rotation    Knee flexion    Knee extension    Ankle dorsiflexion    Ankle plantarflexion    Ankle inversion    Ankle eversion    (Blank rows = not tested)  BED MOBILITY:  Sit to supine Complete Independence Supine to sit Complete Independence Rolling to Right Complete Independence Rolling to Left Complete Independence  TRANSFERS: Assistive device utilized: Quad cane small base  Sit to stand: SBA and CGA Stand to sit: SBA and CGA Chair to chair: SBA and CGA Floor:  varies based on patient cognition and positional preference (pt prefers posterior bump vs quadruped crawl)  GAIT: Gait pattern: step to pattern, step through pattern, decreased arm swing- Right, decreased arm swing- Left, decreased stride length, shuffling, and narrow BOS Distance walked: various clinic distances Assistive device utilized: Quad cane small base Level of assistance: SBA and CGA Comments: Patient demonstrates progressive decrease in step size, but not like that of  a festination pattern.  He tends to carry the cane vs using it creating trip hazard.  PT attempted to cue patient for gait pattern at end of session w/ inconsistent response to intervention.  Did discuss with patient and daughter the cognitive load of using a cane and sometimes creating a fall risk due to these difficulties, but did  not discourage trying to utilize safe gait pattern with NBQC option at home until further assessment and gait training can be completed.  FUNCTIONAL TESTS:  5 times sit to stand: 21.90 seconds w/ BUE support, pt requires repeat simple commands Timed up and go (TUG): To be assessed. 10 meter walk test: To be assessed. Berg Balance Scale: To be assessed.  PATIENT SURVEYS:  None completed due to cognitive demand.  TODAY'S TREATMENT:                                                                                                                              DATE: 06/25/2023 RAMP:  Level of Assistance: SBA and CGA Assistive device utilized: Walker - 4 wheeled Ramp Comments: Several reps performed both inside and out.  Goal of slowing descent to improve rollator management.  CURB:  Level of Assistance: SBA and CGA Assistive device utilized: Walker - 4 wheeled Curb Comments: 3 reps performed progressing to SBA using inside curb only.  Pt requires cues for sequencing.  GAIT: Gait pattern: decreased stride length, shuffling, poor foot clearance- Right, and poor foot clearance- Left Distance walked: 500 ft + various indoor distances Assistive device utilized: Walker - 4 wheeled Level of assistance: SBA and CGA Comments: Pt does well over outdoor wet grass demonstrating good navigation of device with cues to bump AD over edge initially.  He has increased momentum on decline requiring cues and practice to improve.  He has 3 instances of removing hands from handles, but one instance of carryover of safety cues after initial correction.  Moderated cuing provided to improve  shuffle with pt unable to maintain for prolonged periods.  Time spent correcting brake management throughout session and for use during declines.  PATIENT EDUCATION: Education details:  Continue walking program and HEP.  Discussed stairs management if using rollator having someone else bring AD up and down stairs for him.  Arline Asp confirms he will have supervision 24/7 and further discussion of ongoing work on English as a second language teacher, maintaining both UE on AD, and slowing down on decline.  Will place order if pt continues to do well with device next session. Person educated: Patient and Caregiver daughter-in-law Arline Asp Education method: Explanation and Handouts Education comprehension: verbalized understanding and needs further education  HOME EXERCISE PROGRAM: Access Code: [email or web address] URL: [email or web address] Date: 04/30/2023 Prepared by: Camille Bal  Exercises - Sit to Stand with Armchair  - 1 x daily - 7 x weekly - 2 sets - 6 reps - Standing March with Counter Support  - 1 x daily - 7 x weekly - 2 sets - 20 reps - Standing with Head Rotation  - 1 x daily - 7 x weekly - 3 sets - 10 reps - Feet Together Balance at [employer] Eyes Open  - 1 x daily - 7 x weekly - 1 sets - 3 reps - 20-30 seconds hold - Corner Balance Feet Together With Eyes Closed  - 1 x  daily - 7 x weekly - 1 sets - 3 reps - 15-20 seconds hold - Standing Tandem Balance with Counter Support  - 1 x daily - 7 x weekly - 1 sets - 4 reps - 30 seconds hold  Provided fall prevention handout 04/30/2023.  You Can Walk For A Certain Length Of Time Each Day (with family and use AD for safe endurance)                          Walk 4 minutes 3 times per day.             Increase 1-2  minutes every week.             Work up to 15 minutes (1-2 times per day).               Example:                         Day 1-2           4-5 minutes     3 times per day                         Day 7-8           10-12 minutes 2-3 times per  day                         Day 13-14       20-22 minutes 1-2 times per day  GOALS: Goals reviewed with patient? Yes  SHORT TERM GOALS: Target date: 05/18/2023  Patient and caregiver to be able to verbalize fall prevention strategies in order to promote safety in home environment. Baseline:  Caregiver has paper and is able to verbalize completed checklist at home (9/3) Goal status: MET  2.  Patient to perform floor recovery using quadruped method vs posterior bump at no more than modA level in order to improve management at home in the event of a fall. Baseline: SBA w/ cues for sequencing (9/3) Goal status: PARTIALLY MET  3.  Pt will ambulate >/=250 feet over level surfaces with proper sequencing of appropriate LRAD and no more than SBA level of assist to promote improved independence with household and community access. Baseline: Various clinic distances w/ mostly CGA due to improper use of cane; 345' SPC SBA-CGA (9/3) Goal status: PARTIALLY MET  4.  Pt will decrease 5xSTS to </= 17.90 seconds in order to demonstrate decreased risk for falls and improved functional bilateral LE strength and power. Baseline: 21.90 seconds w/ BUE support; 14.31 seconds w/o UE support (9/3) Goal status: MET  LONG TERM GOALS: Target date: 06/15/2023  Pt will be independent and compliant with BLE strengthening and balance focused HEP in order to maintain functional progress and improve mobility. Baseline: Compliant w/ supervision (9/24) Goal status: IN PROGRESS  2.  Patient will be compliant to formal walking program >/= 4 days per week w/ supervision from family to improve aerobic tolerance and ambulatory mechanics w/ appropriate AD. Baseline: Patient has not been compliant recently due to caregiver illness (9/24) Goal status: IN PROGRESS  3.  Pt will decrease 5xSTS to </= 13.90 seconds in order to demonstrate decreased risk for falls and improved functional bilateral LE strength and power. Baseline:  21.90 seconds w/ BUE support; 12.62 seconds no UE support w/ single posterior  LOB (9/24) Goal status: MET  4.  Pt will demonstrate TUG of </=23.53 seconds in order to decrease risk of falls and improve functional mobility using LRAD. Baseline: 28.53 seconds w/ SBQC (7/30); 17.15 seconds w/ SPC (9/24) Goal status: MET  5.  Pt will demonstrate a gait speed of >/=2.02 feet/sec in order to decrease risk for falls. Baseline: 0.55 m/sec OR 1.82 ft/sec (7/30); 2.07 ft/sec (9/24) Goal status: MET  6.  Pt will increase BERG balance score to >/=36/56 to demonstrate improved static balance. Baseline: 32/56 (7/30);  44/56 (9/24) Goal status: MET  LONG TERM GOALS (at re-cert): Target date: 07/20/2023  Pt will be independent and compliant with BLE strengthening and balance focused HEP in order to maintain functional progress and improve mobility. Baseline: Compliant w/ supervision (9/24) Goal status: IN PROGRESS  2.  Patient will be compliant to formal walking program >/= 4 days per week w/ supervision from family to improve aerobic tolerance and ambulatory mechanics w/ appropriate AD. Baseline: Patient has not been compliant recently due to caregiver illness (9/24) Goal status: IN PROGRESS  3.  PT to further assess rollator use outdoors over unlevel surfaces w/ order request placed as appropriate. Baseline: To be further assessed. Goal status: INITIAL  4.  Pt will demonstrate TUG of </=13 seconds in order to decrease risk of falls and improve functional mobility using LRAD. Baseline: 28.53 seconds w/ SBQC (7/30); 17.15 seconds w/ SPC (9/24) Goal status: REVISED  5.  Pt will demonstrate a gait speed of >/=2.27 feet/sec in order to decrease risk for falls. Baseline: 0.55 m/sec OR 1.82 ft/sec (7/30); 2.07 ft/sec (9/24) Goal status: REVISED  6.  Pt will increase BERG balance score to >/=49/56 to demonstrate improved static balance. Baseline: 32/56 (7/30);  44/56 (9/24) Goal status:  REVISED  ASSESSMENT:  CLINICAL IMPRESSION: Focus of skilled session on gait training to improve rollator management.  PT still planning to place order request, but would like further practice with brake management, decline control, and maintaining bimanual contact to AD.  He performed well today and is demonstrating some carryover with practice.  Will continue per POC.  OBJECTIVE IMPAIRMENTS: Abnormal gait, decreased activity tolerance, decreased balance, decreased cognition, difficulty walking, impaired sensation, improper body mechanics, and postural dysfunction.   ACTIVITY LIMITATIONS: carrying, lifting, bending, standing, squatting, stairs, transfers, reach over head, and locomotion level  PARTICIPATION LIMITATIONS: meal prep, cleaning, laundry, driving, shopping, and community activity  PERSONAL FACTORS: Age, Fitness, Past/current experiences, Time since onset of injury/illness/exacerbation, and 1-2 comorbidities: dementia, neuropathy  are also affecting patient's functional outcome.   REHAB POTENTIAL: [place] PMH and personal factors  CLINICAL DECISION MAKING: Evolving/moderate complexity  EVALUATION COMPLEXITY: Moderate  PLAN:  PT FREQUENCY: 1x/week  PT DURATION: 8 weeks + 4 weeks (at re-cert)  PLANNED INTERVENTIONS: Therapeutic exercises, Therapeutic activity, Neuromuscular re-education, Balance training, Gait training, Patient/Family education, Self Care, Stair training, Vestibular training, DME instructions, and Re-evaluation  PLAN FOR NEXT SESSION:  Continue rollator management- keeping hands on rollator, decline control, and brake management - place order request.  Add to HEP for bilateral LE strength and balance, bending, and turning/direction changing - TUG trials w/ distracting blaze pods.  Sadie Haber, PT, DPT 06/25/2023, 5:15 PM

## 2023-07-16 ENCOUNTER — Ambulatory Visit: Payer: PPO | Attending: Podiatry | Admitting: Physical Therapy

## 2023-07-16 ENCOUNTER — Encounter: Payer: Self-pay | Admitting: Physical Therapy

## 2023-07-16 DIAGNOSIS — R200 Anesthesia of skin: Secondary | ICD-10-CM | POA: Diagnosis not present

## 2023-07-16 DIAGNOSIS — R2681 Unsteadiness on feet: Secondary | ICD-10-CM | POA: Insufficient documentation

## 2023-07-16 DIAGNOSIS — M6281 Muscle weakness (generalized): Secondary | ICD-10-CM | POA: Insufficient documentation

## 2023-07-16 DIAGNOSIS — R2689 Other abnormalities of gait and mobility: Secondary | ICD-10-CM | POA: Insufficient documentation

## 2023-07-16 DIAGNOSIS — Z9181 History of falling: Secondary | ICD-10-CM | POA: Diagnosis not present

## 2023-07-16 NOTE — Therapy (Signed)
OUTPATIENT PHYSICAL THERAPY NEURO TREATMENT   Patient Name: ELIYAS NOOR MRN: [id number] DOB:Mar 05, 1946, 77 y.o., male Today's Date: 07/16/2023   PCP: Malva Limes, MD REFERRING PROVIDER: Elinor Parkinson, [place]  END OF SESSION:   PT End of Session - 07/16/23 1454     Visit Number 10    Number of Visits 13   9 + 4   Date for PT Re-Evaluation 08/03/23   pushed out due to scheduling delay   Authorization Type [employer]    PT Start Time 1450    PT Stop Time 1536    PT Time Calculation (min) 46 min    Equipment Utilized During Treatment Gait belt    Activity Tolerance Patient tolerated treatment well    Behavior During Therapy WFL for tasks assessed/performed            Past Medical History:  Diagnosis Date   Depression    High cholesterol    Hypertension    Meniere disease    Ocular Migraines    Shingles 07/16/2015   of eye    Past Surgical History:  Procedure Laterality Date   COLONOSCOPY     COLONOSCOPY WITH PROPOFOL N/A 10/29/2018   Procedure: COLONOSCOPY WITH PROPOFOL;  Surgeon: Midge Minium, MD;  Location: [facility] ENDOSCOPY;  Service: Endoscopy;  Laterality: N/A;   SIGMOIDOSCOPY  2008   Dr. Evette Cristal. Normal per patient report   Patient Active Problem List   Diagnosis Date Noted   Anxiety, mild 10/04/2021   Difficulty walking 10/04/2021   Hearing deficit, bilateral 10/04/2021   Numbness and tingling 10/04/2021   Sensory ataxia 10/04/2021   Numbness and tingling of both feet 02/08/2021   GAD (generalized anxiety disorder) 02/04/2020   Confusion 02/04/2020   History of colonic polyps    Chronic pain of left ankle 10/07/2018   Bilateral hearing loss 08/21/2018   Cerebral ventriculomegaly 08/21/2018   Chronic pain of right knee 10/02/2016   Enthesopathy of hip 08/02/2009   Rotator cuff syndrome 10/04/2008   Chronic infection of sinus 03/30/2008   HLD (hyperlipidemia) 04/09/2007   Former smoker, stopped smoking in distant past 11/23/2005    Allergic rhinitis 08/23/2005   Depressive disorder 08/23/2005   Impotence of organic origin 08/23/2005   Migraine without status migrainosus 08/23/2005    ONSET DATE: a couple years ago  REFERRING DIAG: R20.0,R20.2 (ICD-10-CM) - Numbness and tingling of both feet R26.9 (ICD-10-CM) - Abnormality of gait R26.89 (ICD-10-CM) - Balance problem Z91.81 (ICD-10-CM) - At high risk for falls  THERAPY DIAG:  History of falling  Unsteadiness on feet  Muscle weakness (generalized)  Other abnormalities of gait and mobility  Anesthesia of skin  Rationale for Evaluation and Treatment: Rehabilitation  SUBJECTIVE:  SUBJECTIVE STATEMENT: He is scheduled to see ENT tomorrow morning regarding ear infection.  He ambulates in with SPC.  Patient and Arline Asp deny recent falls.  He denies pain today. Pt accompanied by: self and family member-daughter-in-law [name]  PERTINENT HISTORY: Depression, cerebral ventriculomegaly, HLD, dementia, bilateral hearing loss, GAD, neuropathy  PAIN:  Are you having pain? No  PRECAUTIONS: Fall  RED FLAGS: None - pt has urge incontinence  WEIGHT BEARING RESTRICTIONS: No  FALLS: Has patient fallen in last 6 months? Yes. 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  Daughter-in-law endorses move to her home is long-term.  OBJECTIVE:   DIAGNOSTIC FINDINGS: No recent relevant  imaging.  COGNITION: Overall cognitive status: History of cognitive impairments - at baseline-pt has STM deficits, increased processing time, and delayed recall related to his dementia   SENSATION: Light touch: WFL and N/T from ankle line down into toes  COORDINATION: LE RAMS:  WFL Heel-to-shin:  WFL bilaterally  EDEMA:  None noted in BLE  MUSCLE TONE: None noted in BLE, no clonus present bilaterally  POSTURE: forward head and weight shift right  LOWER EXTREMITY ROM:     Active  Right Eval Left Eval  Hip flexion Grossly WFL  Hip extension   Hip abduction   Hip adduction   Hip internal rotation   Hip external rotation   Knee flexion   Knee extension   Ankle dorsiflexion   Ankle plantarflexion   Ankle inversion    Ankle eversion     (Blank rows = not tested)  LOWER EXTREMITY MMT:    MMT Right Eval Left Eval  Hip flexion    Hip extension    Hip abduction    Hip adduction    Hip internal rotation    Hip external rotation    Knee flexion    Knee extension    Ankle dorsiflexion    Ankle plantarflexion    Ankle inversion    Ankle eversion    (Blank rows = not tested)  BED MOBILITY:  Sit to supine Complete Independence Supine to sit Complete Independence Rolling to Right Complete Independence Rolling to Left Complete Independence  TRANSFERS: Assistive device utilized: Quad cane small base  Sit to stand: SBA and CGA Stand to sit: SBA and CGA Chair to chair: SBA and CGA Floor:  varies based on patient cognition and positional preference (pt prefers posterior bump vs quadruped crawl)  GAIT: Gait pattern: step to pattern, step through pattern, decreased arm swing- Right, decreased arm swing- Left, decreased stride length, shuffling, and narrow BOS Distance walked: various clinic distances Assistive device utilized: Quad cane small base Level of assistance: SBA and CGA Comments: Patient demonstrates progressive decrease in step size, but not like that of  a festination pattern.  He tends to carry the cane vs using it creating trip hazard.  PT attempted to cue patient for gait pattern at end of session w/ inconsistent response to intervention.  Did discuss with patient and daughter the cognitive load of using a cane and sometimes creating a fall risk due to these difficulties, but did not discourage trying to utilize safe gait pattern  with NBQC option at home until further assessment and gait training can be completed.  FUNCTIONAL TESTS:  5 times sit to stand: 21.90 seconds w/ BUE support, pt requires repeat simple commands Timed up and go (TUG): To be assessed. 10 meter walk test: To be assessed. Berg Balance Scale: To be assessed.  PATIENT SURVEYS:  None completed due to cognitive demand.  TODAY'S TREATMENT:                                                                                                                              DATE: 07/16/2023  GAIT: Gait pattern: decreased stride length, shuffling, poor foot clearance- Right, and poor foot clearance- Left Distance walked: 800 ft + various indoor distances Assistive device utilized: Walker - 4 wheeled Level of assistance: SBA and CGA Comments: Pt continues to struggle with increased momentum on decline requiring cues and practice to improve.  He has better brake management and turning today.  Moderated cuing provided to improve shuffle with pt unable to maintain for prolonged periods.  Light cuing for proximity to device over more unlevel transitions.  GAIT: Gait pattern: decreased stride length, shuffling, poor foot clearance- Right, and poor foot clearance- Left Distance walked: 500 ft + various indoor distances Assistive device utilized: Walker - 2 wheeled Level of assistance: SBA and CGA Comments: Pt struggles with continuity and fluidity of movement demonstrating significantly slowed gait speed.  He has difficulty maintaining contact of RW to ground especially during turns and  demonstrates several instances of left veering into grass requiring CGA on RW to assist with correction.  Ongoing cues to diminish shuffling pattern.  Min cues to maintain proximity to device.  PATIENT EDUCATION: Education details:  Continue walking program and HEP.  Encouraged ongoing 24/7 supervision and cues provided for AD options today - discussed deficits seen.  Discussed rollator as having slightly more benefit to patient but needed repetition of safety practice in home following receipt.  Arline Asp confirms he will have supervision 24/7, patient and caregiver would like to pursue rollator vs RW as compared today.  Will place order.  Pt is okay to continue using SPC for household and short community and level distances. Person educated: Patient and Caregiver daughter-in-law Arline Asp Education method: Explanation Education comprehension: verbalized understanding and needs further education  HOME EXERCISE PROGRAM: Access Code: [email or web address] URL: [email or web address] Date: 04/30/2023 Prepared by: Camille Bal  Exercises - Sit to Stand with Armchair  - 1 x daily - 7 x weekly - 2 sets - 6 reps - Standing March with Counter Support  - 1 x daily - 7 x weekly - 2 sets - 20 reps - Standing with Head Rotation  - 1 x daily - 7 x weekly - 3 sets - 10 reps - Feet Together Balance at [employer] Eyes Open  - 1 x daily - 7 x weekly - 1 sets - 3 reps - 20-30 seconds hold - Oncologist Feet Together With  Eyes Closed  - 1 x daily - 7 x weekly - 1 sets - 3 reps - 15-20 seconds hold - Standing Tandem Balance with Counter Support  - 1 x daily - 7 x weekly - 1 sets - 4 reps - 30 seconds hold  Provided fall prevention handout 04/30/2023.  You Can Walk For A Certain Length Of Time Each Day (with family and use AD for safe endurance)                          Walk 4 minutes 3 times per day.             Increase 1-2  minutes every week.             Work up to 15 minutes (1-2 times per day).                Example:                         Day 1-2           4-5 minutes     3 times per day                         Day 7-8           10-12 minutes 2-3 times per day                         Day 13-14       20-22 minutes 1-2 times per day  GOALS: Goals reviewed with patient? Yes  SHORT TERM GOALS: Target date: 05/18/2023  Patient and caregiver to be able to verbalize fall prevention strategies in order to promote safety in home environment. Baseline:  Caregiver has paper and is able to verbalize completed checklist at home (9/3) Goal status: MET  2.  Patient to perform floor recovery using quadruped method vs posterior bump at no more than modA level in order to improve management at home in the event of a fall. Baseline: SBA w/ cues for sequencing (9/3) Goal status: PARTIALLY MET  3.  Pt will ambulate >/=250 feet over level surfaces with proper sequencing of appropriate LRAD and no more than SBA level of assist to promote improved independence with household and community access. Baseline: Various clinic distances w/ mostly CGA due to improper use of cane; 345' SPC SBA-CGA (9/3) Goal status: PARTIALLY MET  4.  Pt will decrease 5xSTS to </= 17.90 seconds in order to demonstrate decreased risk for falls and improved functional bilateral LE strength and power. Baseline: 21.90 seconds w/ BUE support; 14.31 seconds w/o UE support (9/3) Goal status: MET  LONG TERM GOALS: Target date: 06/15/2023  Pt will be independent and compliant with BLE strengthening and balance focused HEP in order to maintain functional progress and improve mobility. Baseline: Compliant w/ supervision (9/24) Goal status: IN PROGRESS  2.  Patient will be compliant to formal walking program >/= 4 days per week w/ supervision from family to improve aerobic tolerance and ambulatory mechanics w/ appropriate AD. Baseline: Patient has not been compliant recently due to caregiver illness (9/24) Goal status: IN PROGRESS  3.   Pt will decrease 5xSTS to </= 13.90 seconds in order to demonstrate decreased risk for falls and improved functional bilateral LE strength and power. Baseline: 21.90 seconds w/ BUE support; 12.62 seconds  no UE support w/ single posterior LOB (9/24) Goal status: MET  4.  Pt will demonstrate TUG of </=23.53 seconds in order to decrease risk of falls and improve functional mobility using LRAD. Baseline: 28.53 seconds w/ SBQC (7/30); 17.15 seconds w/ SPC (9/24) Goal status: MET  5.  Pt will demonstrate a gait speed of >/=2.02 feet/sec in order to decrease risk for falls. Baseline: 0.55 m/sec OR 1.82 ft/sec (7/30); 2.07 ft/sec (9/24) Goal status: MET  6.  Pt will increase BERG balance score to >/=36/56 to demonstrate improved static balance. Baseline: 32/56 (7/30);  44/56 (9/24) Goal status: MET  LONG TERM GOALS (at re-cert): Target date: 07/20/2023  Pt will be independent and compliant with BLE strengthening and balance focused HEP in order to maintain functional progress and improve mobility. Baseline: Compliant w/ supervision (9/24) Goal status: IN PROGRESS  2.  Patient will be compliant to formal walking program >/= 4 days per week w/ supervision from family to improve aerobic tolerance and ambulatory mechanics w/ appropriate AD. Baseline: Patient has not been compliant recently due to caregiver illness (9/24) Goal status: IN PROGRESS  3.  PT to further assess rollator use outdoors over unlevel surfaces w/ order request placed as appropriate. Baseline: To be further assessed. Goal status: INITIAL  4.  Pt will demonstrate TUG of </=13 seconds in order to decrease risk of falls and improve functional mobility using LRAD. Baseline: 28.53 seconds w/ SBQC (7/30); 17.15 seconds w/ SPC (9/24) Goal status: REVISED  5.  Pt will demonstrate a gait speed of >/=2.27 feet/sec in order to decrease risk for falls. Baseline: 0.55 m/sec OR 1.82 ft/sec (7/30); 2.07 ft/sec (9/24) Goal status:  REVISED  6.  Pt will increase BERG balance score to >/=49/56 to demonstrate improved static balance. Baseline: 32/56 (7/30);  44/56 (9/24) Goal status: REVISED  ASSESSMENT:  CLINICAL IMPRESSION: Focus of skilled session on gait training to improve rollator management and determine safety potential vs 2WW.  Pt continues to have some trouble with controlling momentum on decline, but had no major LOB.  He exhibitis similar difficulty when using cane or no AD and this was communicated to caregiver regarding ongoing supervision and practice of safe ambulatory mechanics to improve over time.  Rollator is still the preferred device for this patient at current due to ongoing difficulty fluidly navigating 2WW.  Compared to rollator he has difficulty maintaining approximation to device and navigating a straight path.  Will place order request and plan to continue practice with device in coming sessions.  OBJECTIVE IMPAIRMENTS: Abnormal gait, decreased activity tolerance, decreased balance, decreased cognition, difficulty walking, impaired sensation, improper body mechanics, and postural dysfunction.   ACTIVITY LIMITATIONS: carrying, lifting, bending, standing, squatting, stairs, transfers, reach over head, and locomotion level  PARTICIPATION LIMITATIONS: meal prep, cleaning, laundry, driving, shopping, and community activity  PERSONAL FACTORS: Age, Fitness, Past/current experiences, Time since onset of injury/illness/exacerbation, and 1-2 comorbidities: dementia, neuropathy  are also affecting patient's functional outcome.   REHAB POTENTIAL: [place] PMH and personal factors  CLINICAL DECISION MAKING: Evolving/moderate complexity  EVALUATION COMPLEXITY: Moderate  PLAN:  PT FREQUENCY: 1x/week  PT DURATION: 8 weeks + 4 weeks (at re-cert)  PLANNED INTERVENTIONS: Therapeutic exercises, Therapeutic activity, Neuromuscular re-education, Balance training, Gait training, Patient/Family education, Self  Care, Stair training, Vestibular training, DME instructions, and Re-evaluation  PLAN FOR NEXT SESSION:  Continue rollator management- decline control and brake management.  Add to HEP for bilateral LE strength and balance, bending, and turning/direction changing - TUG  trials w/ distracting blaze pods - use rollator.  Sadie Haber, PT, DPT 07/16/2023, 3:38 PM

## 2023-07-17 DIAGNOSIS — H90A21 Sensorineural hearing loss, unilateral, right ear, with restricted hearing on the contralateral side: Secondary | ICD-10-CM | POA: Diagnosis not present

## 2023-07-17 DIAGNOSIS — H8101 Meniere's disease, right ear: Secondary | ICD-10-CM | POA: Diagnosis not present

## 2023-07-23 ENCOUNTER — Telehealth: Payer: Self-pay | Admitting: Physical Therapy

## 2023-07-23 NOTE — Telephone Encounter (Signed)
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  They will benefit from use of a rollator in order to improve safety with functional mobility.   Please submit order in epic or fax to [phone].   Thank you, Camille Bal, PT, DPT

## 2023-07-24 ENCOUNTER — Ambulatory Visit: Payer: PPO | Admitting: Physical Therapy

## 2023-07-24 ENCOUNTER — Encounter: Payer: Self-pay | Admitting: Physical Therapy

## 2023-07-24 DIAGNOSIS — Z9181 History of falling: Secondary | ICD-10-CM

## 2023-07-24 DIAGNOSIS — R2689 Other abnormalities of gait and mobility: Secondary | ICD-10-CM

## 2023-07-24 DIAGNOSIS — M6281 Muscle weakness (generalized): Secondary | ICD-10-CM

## 2023-07-24 DIAGNOSIS — R2681 Unsteadiness on feet: Secondary | ICD-10-CM

## 2023-07-24 NOTE — Therapy (Signed)
OUTPATIENT PHYSICAL THERAPY NEURO TREATMENT   Patient Name: Cameron King MRN: [id number] DOB:02-24-1946, 77 y.o., male Today's Date: 07/24/2023   PCP: Malva Limes, MD REFERRING PROVIDER: Elinor Parkinson, [place]  END OF SESSION:   PT End of Session - 07/24/23 1322     Visit Number 11    Number of Visits 13   9 + 4   Date for PT Re-Evaluation 08/03/23   pushed out due to scheduling delay   Authorization Type [employer]    PT Start Time 1317    PT Stop Time 1402    PT Time Calculation (min) 45 min    Equipment Utilized During Treatment Gait belt    Activity Tolerance Patient tolerated treatment well    Behavior During Therapy WFL for tasks assessed/performed            Past Medical History:  Diagnosis Date   Depression    High cholesterol    Hypertension    Meniere disease    Ocular Migraines    Shingles 07/16/2015   of eye    Past Surgical History:  Procedure Laterality Date   COLONOSCOPY     COLONOSCOPY WITH PROPOFOL N/A 10/29/2018   Procedure: COLONOSCOPY WITH PROPOFOL;  Surgeon: Midge Minium, MD;  Location: [facility] ENDOSCOPY;  Service: Endoscopy;  Laterality: N/A;   SIGMOIDOSCOPY  2008   Dr. Evette Cristal. Normal per patient report   Patient Active Problem List   Diagnosis Date Noted   Anxiety, mild 10/04/2021   Difficulty walking 10/04/2021   Hearing deficit, bilateral 10/04/2021   Numbness and tingling 10/04/2021   Sensory ataxia 10/04/2021   Numbness and tingling of both feet 02/08/2021   GAD (generalized anxiety disorder) 02/04/2020   Confusion 02/04/2020   History of colonic polyps    Chronic pain of left ankle 10/07/2018   Bilateral hearing loss 08/21/2018   Cerebral ventriculomegaly 08/21/2018   Chronic pain of right knee 10/02/2016   Enthesopathy of hip 08/02/2009   Rotator cuff syndrome 10/04/2008   Chronic infection of sinus 03/30/2008   HLD (hyperlipidemia) 04/09/2007   Former smoker, stopped smoking in distant past 11/23/2005    Allergic rhinitis 08/23/2005   Depressive disorder 08/23/2005   Impotence of organic origin 08/23/2005   Migraine without status migrainosus 08/23/2005    ONSET DATE: a couple years ago  REFERRING DIAG: R20.0,R20.2 (ICD-10-CM) - Numbness and tingling of both feet R26.9 (ICD-10-CM) - Abnormality of gait R26.89 (ICD-10-CM) - Balance problem Z91.81 (ICD-10-CM) - At high risk for falls  THERAPY DIAG:  History of falling  Unsteadiness on feet  Muscle weakness (generalized)  Other abnormalities of gait and mobility  Rationale for Evaluation and Treatment: Rehabilitation  SUBJECTIVE:  SUBJECTIVE STATEMENT: He has recently seen ENT regarding ear issues and is on a prednisone taper.  He feels he can hear a little better today.  He denies falls or acute changes.  Arline Asp reports the doctor felt it was related to Meniere's Disease and they will be following up with the ENT in about a week. Pt accompanied by: self and family member-daughter-in-law [name]  PERTINENT HISTORY: Depression, cerebral ventriculomegaly, HLD, dementia, bilateral hearing loss, GAD, neuropathy  PAIN:  Are you having pain? No - arch of left foot is achy off and on  PRECAUTIONS: Fall  RED FLAGS: None - pt has urge incontinence  WEIGHT BEARING RESTRICTIONS: No  FALLS: Has patient fallen in last 6 months? Yes. 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  Daughter-in-law endorses move to her home is long-term.  OBJECTIVE:   DIAGNOSTIC FINDINGS: No recent relevant imaging.  COGNITION: Overall cognitive status: History of cognitive impairments - at baseline-pt has STM deficits, increased processing time, and delayed recall related to his dementia   SENSATION: Light touch: WFL and N/T from ankle line down into toes  COORDINATION: LE RAMS:  WFL Heel-to-shin:  WFL bilaterally  EDEMA:  None noted in BLE  MUSCLE TONE: None noted in BLE, no clonus present bilaterally  POSTURE: forward head and weight shift right  LOWER EXTREMITY ROM:     Active  Right Eval Left Eval  Hip flexion Grossly WFL  Hip extension   Hip abduction   Hip adduction   Hip internal rotation   Hip external rotation   Knee flexion   Knee extension   Ankle dorsiflexion   Ankle plantarflexion   Ankle inversion    Ankle eversion     (Blank rows = not tested)  LOWER EXTREMITY MMT:    MMT Right Eval Left Eval  Hip flexion    Hip extension    Hip abduction    Hip adduction    Hip internal rotation    Hip external rotation    Knee flexion    Knee extension    Ankle dorsiflexion    Ankle plantarflexion    Ankle inversion    Ankle eversion    (Blank rows = not tested)  BED MOBILITY:  Sit to supine Complete Independence Supine to sit Complete Independence Rolling to Right Complete Independence Rolling to Left Complete Independence  TRANSFERS: Assistive device utilized: Quad cane small base  Sit to stand: SBA and CGA Stand to sit: SBA and CGA Chair to chair: SBA and CGA Floor:  varies based on patient cognition and positional preference (pt prefers posterior bump vs quadruped crawl)  GAIT: Gait pattern: step to pattern, step through pattern, decreased arm swing- Right, decreased arm swing- Left, decreased stride length, shuffling, and narrow BOS Distance walked: various clinic distances Assistive device utilized: Quad cane small  base Level of assistance: SBA and CGA Comments: Patient demonstrates progressive decrease in step size, but not like that of a festination pattern.  He tends to carry the cane vs using it creating trip hazard.  PT attempted to cue patient for gait pattern at end of session w/ inconsistent  response to intervention.  Did discuss with patient and daughter the cognitive load of using a cane and sometimes creating a fall risk due to these difficulties, but did not discourage trying to utilize safe gait pattern with NBQC option at home until further assessment and gait training can be completed.  FUNCTIONAL TESTS:  5 times sit to stand: 21.90 seconds w/ BUE support, pt requires repeat simple commands Timed up and go (TUG): To be assessed. 10 meter walk test: To be assessed. Berg Balance Scale: To be assessed.  PATIENT SURVEYS:  None completed due to cognitive demand.  TODAY'S TREATMENT:                                                                                                                              DATE: 07/24/2023  TA: 1 Blaze pod on color tap setting for improved TUG trials and rollator management with turning and brakes.  Performed on 1 minute intervals with 1 minute rest periods.  Pt requires SBA guarding. Round 1:  TUG trial setup.  3 hits. Round 2:  " setup.  3 hits. Round 3:  " setup.  2 hits. Notable errors/deficits:  Pt is distractible, brake management improves each trial, cues and education on when to wait for traffic vs risky pivot turns creating instability. 3 Blaze pods on distraction and scanning setting for improved dual tasking, obstacle negotiation, and AD management.  Performed on 2 minute intervals with 30 second rest periods.  Pt requires SBA guarding. Round 1:  linear firm ground setup with single target color and 2 distracting colors.  6 hits. Round 2:  " setup.  5 hits. Round 3:  " setup.  5 hits. Notable errors/deficits:  Pt has difficulty with sustained  attention to task, but demonstrates improved brake management with repetition.  Cues for approximation to rollator during taps to prevent momentum when brakes not engaged. 4 Blaze pods on distraction and scanning setting for improved rollator management and processing speed/task initiation.  Performed on 2 minute intervals with 30 second rest periods.  Pt requires SBA-CGA guarding. Round 1:  firm ground semi-circle setup w/ 2 target colors and 2 distracting colors.  4 hits. Round 2:  " setup.  3 hits. Round 3:  " setup.  3 hits. Notable errors/deficits:  Patient makes single color error and runs over pods x2, increased distractibility upon making error and decreased sustained attention with increased stimuli.    PATIENT EDUCATION: Education details:  Continue walking program and HEP.  Encouraged ongoing 24/7 supervision.  Informed of rollator request and plan for goal assessment and likely placing pt on 30 day hold until rollator received and resume short POC as needed.  Pt is okay to continue using SPC for household and short community and level distances. Person educated: Patient and Caregiver daughter-in-law Arline Asp Education method: Explanation Education comprehension: verbalized understanding and needs further education  HOME EXERCISE PROGRAM: Access Code: [email or web address] URL: [email or web address] Date: 04/30/2023  Prepared by: Camille Bal  Exercises - Sit to Stand with Armchair  - 1 x daily - 7 x weekly - 2 sets - 6 reps - Standing March with Counter Support  - 1 x daily - 7 x weekly - 2 sets - 20 reps - Standing with Head Rotation  - 1 x daily - 7 x weekly - 3 sets - 10 reps - Feet Together Balance at [employer] Eyes Open  - 1 x daily - 7 x weekly - 1 sets - 3 reps - 20-30 seconds hold - Corner Balance Feet Together With Eyes Closed  - 1 x daily - 7 x weekly - 1 sets - 3 reps - 15-20 seconds hold - Standing Tandem Balance with Counter Support  - 1 x daily - 7 x weekly -  1 sets - 4 reps - 30 seconds hold  Provided fall prevention handout 04/30/2023.  You Can Walk For A Certain Length Of Time Each Day (with family and use AD for safe endurance)                          Walk 4 minutes 3 times per day.             Increase 1-2  minutes every week.             Work up to 15 minutes (1-2 times per day).               Example:                         Day 1-2           4-5 minutes     3 times per day                         Day 7-8           10-12 minutes 2-3 times per day                         Day 13-14       20-22 minutes 1-2 times per day  GOALS: Goals reviewed with patient? Yes  SHORT TERM GOALS: Target date: 05/18/2023  Patient and caregiver to be able to verbalize fall prevention strategies in order to promote safety in home environment. Baseline:  Caregiver has paper and is able to verbalize completed checklist at home (9/3) Goal status: MET  2.  Patient to perform floor recovery using quadruped method vs posterior bump at no more than modA level in order to improve management at home in the event of a fall. Baseline: SBA w/ cues for sequencing (9/3) Goal status: PARTIALLY MET  3.  Pt will ambulate >/=250 feet over level surfaces with proper sequencing of appropriate LRAD and no more than SBA level of assist to promote improved independence with household and community access. Baseline: Various clinic distances w/ mostly CGA due to improper use of cane; 345' SPC SBA-CGA (9/3) Goal status: PARTIALLY MET  4.  Pt will decrease 5xSTS to </= 17.90 seconds in order to demonstrate decreased risk for falls and improved functional bilateral LE strength and power. Baseline: 21.90 seconds w/ BUE support; 14.31 seconds w/o UE support (9/3) Goal status: MET  LONG TERM GOALS: Target date: 06/15/2023  Pt will be independent and compliant with BLE strengthening and balance  focused HEP in order to maintain functional progress and improve mobility. Baseline:  Compliant w/ supervision (9/24) Goal status: IN PROGRESS  2.  Patient will be compliant to formal walking program >/= 4 days per week w/ supervision from family to improve aerobic tolerance and ambulatory mechanics w/ appropriate AD. Baseline: Patient has not been compliant recently due to caregiver illness (9/24) Goal status: IN PROGRESS  3.  Pt will decrease 5xSTS to </= 13.90 seconds in order to demonstrate decreased risk for falls and improved functional bilateral LE strength and power. Baseline: 21.90 seconds w/ BUE support; 12.62 seconds no UE support w/ single posterior LOB (9/24) Goal status: MET  4.  Pt will demonstrate TUG of </=23.53 seconds in order to decrease risk of falls and improve functional mobility using LRAD. Baseline: 28.53 seconds w/ SBQC (7/30); 17.15 seconds w/ SPC (9/24) Goal status: MET  5.  Pt will demonstrate a gait speed of >/=2.02 feet/sec in order to decrease risk for falls. Baseline: 0.55 m/sec OR 1.82 ft/sec (7/30); 2.07 ft/sec (9/24) Goal status: MET  6.  Pt will increase BERG balance score to >/=36/56 to demonstrate improved static balance. Baseline: 32/56 (7/30);  44/56 (9/24) Goal status: MET  LONG TERM GOALS (at re-cert): Target date: 07/20/2023  Pt will be independent and compliant with BLE strengthening and balance focused HEP in order to maintain functional progress and improve mobility. Baseline: Compliant w/ supervision (9/24) Goal status: IN PROGRESS  2.  Patient will be compliant to formal walking program >/= 4 days per week w/ supervision from family to improve aerobic tolerance and ambulatory mechanics w/ appropriate AD. Baseline: Patient has not been compliant recently due to caregiver illness (9/24) Goal status: IN PROGRESS  3.  PT to further assess rollator use outdoors over unlevel surfaces w/ order request placed as appropriate. Baseline: To be further assessed. Goal status: INITIAL  4.  Pt will demonstrate TUG of </=13 seconds  in order to decrease risk of falls and improve functional mobility using LRAD. Baseline: 28.53 seconds w/ SBQC (7/30); 17.15 seconds w/ SPC (9/24) Goal status: REVISED  5.  Pt will demonstrate a gait speed of >/=2.27 feet/sec in order to decrease risk for falls. Baseline: 0.55 m/sec OR 1.82 ft/sec (7/30); 2.07 ft/sec (9/24) Goal status: REVISED  6.  Pt will increase BERG balance score to >/=49/56 to demonstrate improved static balance. Baseline: 32/56 (7/30);  44/56 (9/24) Goal status: REVISED  ASSESSMENT:  CLINICAL IMPRESSION: Entirety of session focused on performing TUG style ambulatory trials using blaze pods for engagement to rollator use and proper management.  Pt shows improved safety with repetitions performed today.  He is due for goal assessment next visit with potential re-cert and placing on hold until he receives rollator for further assessment and practice.  Will continue per POC.  OBJECTIVE IMPAIRMENTS: Abnormal gait, decreased activity tolerance, decreased balance, decreased cognition, difficulty walking, impaired sensation, improper body mechanics, and postural dysfunction.   ACTIVITY LIMITATIONS: carrying, lifting, bending, standing, squatting, stairs, transfers, reach over head, and locomotion level  PARTICIPATION LIMITATIONS: meal prep, cleaning, laundry, driving, shopping, and community activity  PERSONAL FACTORS: Age, Fitness, Past/current experiences, Time since onset of injury/illness/exacerbation, and 1-2 comorbidities: dementia, neuropathy  are also affecting patient's functional outcome.   REHAB POTENTIAL: Fair See PMH and personal factors  CLINICAL DECISION MAKING: Evolving/moderate complexity  EVALUATION COMPLEXITY: Moderate  PLAN:  PT FREQUENCY: 1x/week  PT DURATION: 8 weeks + 4 weeks (at re-cert)  PLANNED INTERVENTIONS: Therapeutic exercises, Therapeutic activity, Neuromuscular  re-education, Balance training, Gait training, Patient/Family education,  Self Care, Stair training, Vestibular training, DME instructions, and Re-evaluation  PLAN FOR NEXT SESSION:  Continue rollator management- decline control and brake management.  Add to HEP for bilateral LE strength and balance, bending, and turning/direction changing - TUG trials w/ distracting blaze pods - use rollator.  ASSESS LTGs- re-cert/30 day hold?  Sadie Haber, PT, DPT 07/24/2023, 2:10 PM

## 2023-07-31 ENCOUNTER — Ambulatory Visit: Payer: PPO | Admitting: Physical Therapy

## 2023-07-31 DIAGNOSIS — H903 Sensorineural hearing loss, bilateral: Secondary | ICD-10-CM | POA: Diagnosis not present

## 2023-07-31 DIAGNOSIS — H8101 Meniere's disease, right ear: Secondary | ICD-10-CM | POA: Diagnosis not present

## 2023-07-31 DIAGNOSIS — H90A21 Sensorineural hearing loss, unilateral, right ear, with restricted hearing on the contralateral side: Secondary | ICD-10-CM | POA: Diagnosis not present

## 2023-08-03 ENCOUNTER — Ambulatory Visit: Payer: PPO | Attending: Podiatry | Admitting: Physical Therapy

## 2023-08-03 ENCOUNTER — Encounter: Payer: Self-pay | Admitting: Physical Therapy

## 2023-08-03 DIAGNOSIS — M6281 Muscle weakness (generalized): Secondary | ICD-10-CM

## 2023-08-03 DIAGNOSIS — Z9181 History of falling: Secondary | ICD-10-CM | POA: Diagnosis not present

## 2023-08-03 DIAGNOSIS — R200 Anesthesia of skin: Secondary | ICD-10-CM | POA: Insufficient documentation

## 2023-08-03 DIAGNOSIS — R2681 Unsteadiness on feet: Secondary | ICD-10-CM | POA: Diagnosis not present

## 2023-08-03 DIAGNOSIS — R2689 Other abnormalities of gait and mobility: Secondary | ICD-10-CM | POA: Diagnosis not present

## 2023-08-03 NOTE — Therapy (Signed)
OUTPATIENT PHYSICAL THERAPY NEURO TREATMENT - RE-CERT   Patient Name: Cameron King MRN: [id number] DOB:1946/01/05, 77 y.o., male Today's Date: 08/03/2023   PCP: Malva Limes, MD REFERRING PROVIDER: Elinor Parkinson, [place]  END OF SESSION:   PT End of Session - 08/03/23 1454     Visit Number 12    Number of Visits 17   13 + 4   Date for PT Re-Evaluation 09/07/23   pushed out due to scheduling delay   Authorization Type [employer]    PT Start Time 1450    PT Stop Time 1530    PT Time Calculation (min) 40 min    Equipment Utilized During Treatment Gait belt    Activity Tolerance Patient tolerated treatment well    Behavior During Therapy WFL for tasks assessed/performed            Past Medical History:  Diagnosis Date   Depression    High cholesterol    Hypertension    Meniere disease    Ocular Migraines    Shingles 07/16/2015   of eye    Past Surgical History:  Procedure Laterality Date   COLONOSCOPY     COLONOSCOPY WITH PROPOFOL N/A 10/29/2018   Procedure: COLONOSCOPY WITH PROPOFOL;  Surgeon: Midge Minium, MD;  Location: [facility] ENDOSCOPY;  Service: Endoscopy;  Laterality: N/A;   SIGMOIDOSCOPY  2008   Dr. Evette Cristal. Normal per patient report   Patient Active Problem List   Diagnosis Date Noted   Anxiety, mild 10/04/2021   Difficulty walking 10/04/2021   Hearing deficit, bilateral 10/04/2021   Numbness and tingling 10/04/2021   Sensory ataxia 10/04/2021   Numbness and tingling of both feet 02/08/2021   GAD (generalized anxiety disorder) 02/04/2020   Confusion 02/04/2020   History of colonic polyps    Chronic pain of left ankle 10/07/2018   Bilateral hearing loss 08/21/2018   Cerebral ventriculomegaly 08/21/2018   Chronic pain of right knee 10/02/2016   Enthesopathy of hip 08/02/2009   Rotator cuff syndrome 10/04/2008   Chronic infection of sinus 03/30/2008   HLD (hyperlipidemia) 04/09/2007   Former smoker, stopped smoking in distant past 11/23/2005    Allergic rhinitis 08/23/2005   Depressive disorder 08/23/2005   Impotence of organic origin 08/23/2005   Migraine without status migrainosus 08/23/2005    ONSET DATE: a couple years ago  REFERRING DIAG: R20.0,R20.2 (ICD-10-CM) - Numbness and tingling of both feet R26.9 (ICD-10-CM) - Abnormality of gait R26.89 (ICD-10-CM) - Balance problem Z91.81 (ICD-10-CM) - At high risk for falls  THERAPY DIAG:  History of falling  Unsteadiness on feet  Muscle weakness (generalized)  Other abnormalities of gait and mobility  Rationale for Evaluation and Treatment: Rehabilitation  SUBJECTIVE:  SUBJECTIVE STATEMENT: He is still having some right ear troubles - Arline Asp thinks he may be heading towards getting hearing aides.  They deny falls or acute changes.  He feels he is doing well.  Arline Asp states his walking tolerance/distance has progressed to down the street at home. Pt accompanied by: self and family member-daughter-in-law [name]  PERTINENT HISTORY: Depression, cerebral ventriculomegaly, HLD, dementia, bilateral hearing loss, GAD, neuropathy  PAIN:  Are you having pain? No - left foot still intermittently painful  PRECAUTIONS: Fall  RED FLAGS: None - pt has urge incontinence  WEIGHT BEARING RESTRICTIONS: No  FALLS: Has patient fallen in last 6 months? Yes. 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  Daughter-in-law  endorses move to her home is long-term.  OBJECTIVE:   DIAGNOSTIC FINDINGS: No recent relevant imaging.  COGNITION: Overall cognitive status: History of cognitive impairments - at baseline-pt has STM deficits, increased processing time, and delayed recall related to his dementia   SENSATION: Light touch: WFL and N/T from ankle line down into toes  COORDINATION: LE RAMS:  WFL Heel-to-shin:  WFL bilaterally  EDEMA:  None noted in BLE  MUSCLE TONE: None noted in BLE, no clonus present bilaterally  POSTURE: forward head and weight shift right  LOWER EXTREMITY ROM:     Active  Right Eval Left Eval  Hip flexion Grossly WFL  Hip extension   Hip abduction   Hip adduction   Hip internal rotation   Hip external rotation   Knee flexion   Knee extension   Ankle dorsiflexion   Ankle plantarflexion   Ankle inversion    Ankle eversion     (Blank rows = not tested)  LOWER EXTREMITY MMT:    MMT Right Eval Left Eval  Hip flexion    Hip extension    Hip abduction    Hip adduction    Hip internal rotation    Hip external rotation    Knee flexion    Knee extension    Ankle dorsiflexion    Ankle plantarflexion    Ankle inversion    Ankle eversion    (Blank rows = not tested)  BED MOBILITY:  Sit to supine Complete Independence Supine to sit Complete Independence Rolling to Right Complete Independence Rolling to Left Complete Independence  TRANSFERS: Assistive device utilized: Quad cane small base  Sit to stand: SBA and CGA Stand to sit: SBA and CGA Chair to chair: SBA and CGA Floor:  varies based on patient cognition and positional preference (pt prefers posterior bump vs quadruped crawl)  GAIT: Gait pattern: step to pattern, step through pattern, decreased arm swing- Right, decreased arm swing- Left, decreased stride length, shuffling, and narrow BOS Distance walked: various clinic distances Assistive device utilized: Quad cane small base Level of assistance:  SBA and CGA Comments: Patient demonstrates progressive decrease in step size, but not like that of a festination pattern.  He tends to carry the cane vs using it creating trip hazard.  PT attempted to cue patient for gait pattern at end of session w/ inconsistent response to intervention.  Did discuss with patient and daughter the cognitive load  of using a cane and sometimes creating a fall risk due to these difficulties, but did not discourage trying to utilize safe gait pattern with NBQC option at home until further assessment and gait training can be completed.  FUNCTIONAL TESTS:  5 times sit to stand: 21.90 seconds w/ BUE support, pt requires repeat simple commands Timed up and go (TUG): To be assessed. 10 meter walk test: To be assessed. Berg Balance Scale: To be assessed.  PATIENT SURVEYS:  None completed due to cognitive demand.  TODAY'S TREATMENT:                                                                                                                              DATE: 08/03/2023 -Arline Asp states they have current copy and pt does not feel he needs review.  They demonstrate understanding and compliance to walking program and pt is doing well with progression. TUG trials: -1:  16.56 seconds -2:  13.50 seconds -3:  12.82 seconds -AVERAGE:  14.29 sec w/ SPC SBA  - :  11.75 sec = 0.85 m/sec OR 2.81 ft/sec w/ SPC SBA  -BERG:  [facility] PT Assessment - 08/03/23 1509       Berg Balance Test   Sit to Stand Able to stand without using hands and stabilize independently    Standing Unsupported Able to stand safely 2 minutes    Sitting with Back Unsupported but Feet Supported on Floor or Stool Able to sit safely and securely 2 minutes    Stand to Sit Sits safely with minimal use of hands    Transfers Able to transfer safely, minor use of hands    Standing Unsupported with Eyes Closed Able to stand 10 seconds with supervision    Standing Unsupported with Feet Together Able to place  feet together independently and stand for 1 minute with supervision    From Standing, Reach Forward with Outstretched Arm Can reach confidently >25 cm (10")    From Standing Position, Pick up Object from Floor Able to pick up shoe safely and easily    From Standing Position, Turn to Look Behind Over each Shoulder Looks behind from both sides and weight shifts well    Turn 360 Degrees Able to turn 360 degrees safely in 4 seconds or less    Standing Unsupported, Alternately Place Feet on Step/Stool Able to complete >2 steps/needs minimal assist    Standing Unsupported, One Foot in Front Needs help to step but can hold 15 seconds    Standing on One Leg Tries to lift leg/unable to hold 3 seconds but remains standing independently    Total Score 45    Berg comment: 45/56 = significant fall risk              PATIENT EDUCATION: Education details:  Continue walking program and HEP.  Encouraged ongoing 24/7 supervision.  Plan and process for re-cert and order for rollator placed 07/31/2023.  Discussed progress towards goals and  progression of HEP (away from UE support for marching and away from wall with close supervision for eyes closed static balance).  Contact [employer] about rollator in a week if have not heard (provided number to caregiver). Person educated: Patient and Caregiver daughter-in-law Arline Asp Education method: Explanation Education comprehension: verbalized understanding and needs further education  HOME EXERCISE PROGRAM: Access Code: [email or web address] URL: [email or web address] Date: 04/30/2023 Prepared by: Camille Bal  Exercises - Sit to Stand with Armchair  - 1 x daily - 7 x weekly - 2 sets - 6 reps - Standing March with Counter Support  - 1 x daily - 7 x weekly - 2 sets - 20 reps - Standing with Head Rotation  - 1 x daily - 7 x weekly - 3 sets - 10 reps - Feet Together Balance at [employer] Eyes Open  - 1 x daily - 7 x weekly - 1 sets - 3 reps - 20-30 seconds hold -  Corner Balance Feet Together With Eyes Closed  - 1 x daily - 7 x weekly - 1 sets - 3 reps - 15-20 seconds hold - Standing Tandem Balance with Counter Support  - 1 x daily - 7 x weekly - 1 sets - 4 reps - 30 seconds hold  Provided fall prevention handout 04/30/2023.  You Can Walk For A Certain Length Of Time Each Day (with family and use AD for safe endurance)                          Walk 4 minutes 3 times per day.             Increase 1-2  minutes every week.             Work up to 15 minutes (1-2 times per day).               Example:                         Day 1-2           4-5 minutes     3 times per day                         Day 7-8           10-12 minutes 2-3 times per day                         Day 13-14       20-22 minutes 1-2 times per day  GOALS: Goals reviewed with patient? Yes  LONG TERM GOALS (at re-cert): Target date: 07/20/2023  Pt will be independent and compliant with BLE strengthening and balance focused HEP in order to maintain functional progress and improve mobility. Baseline: Compliant w/ supervision (9/24); compliant and IND per caregiver (11/8) Goal status: MET  2.  Patient will be compliant to formal walking program >/= 4 days per week w/ supervision from family to improve aerobic tolerance and ambulatory mechanics w/ appropriate AD. Baseline: Patient has not been compliant recently due to caregiver illness (9/24); at least 5 per caregiver (11/8) Goal status: MET  3.  PT to further assess rollator use outdoors over unlevel surfaces w/ order request placed as appropriate. Baseline: Order placed (11/5) Goal status: MET  4.  Pt will demonstrate TUG of </=13 seconds in order to decrease risk of  falls and improve functional mobility using LRAD. Baseline: 28.53 seconds w/ SBQC (7/30); 17.15 seconds w/ SPC (9/24); 14.29 sec w/ SPC SBA (11/8) Goal status: PARTIALLY MET  5.  Pt will demonstrate a gait speed of >/=2.27 feet/sec in order to decrease risk for  falls. Baseline: 0.55 m/sec OR 1.82 ft/sec (7/30); 2.07 ft/sec (9/24); 2.81 ft/sec w/ SPC SBA (11/8) Goal status: MET  6.  Pt will increase BERG balance score to >/=49/56 to demonstrate improved static balance. Baseline: 32/56 (7/30);  44/56 (9/24); 45/56 (11/8) Goal status: NOT MET  LONG TERM GOALS (at re-cert): Target date: 08/31/2023  1.  Pt will demonstrate TUG of </=13 seconds in order to decrease risk of falls and improve functional mobility using LRAD. Baseline: 14.29 sec w/ SPC SBA - average (11/8) Goal status: ONGOING  2.  Pt will demonstrate a gait speed of >/=3.01 feet/sec in order to decrease risk for falls. Baseline: 2.81 ft/sec w/ SPC SBA (11/8) Goal status: REVISED  3.  Pt will increase BERG balance score to >/=49/56 to demonstrate improved static balance. Baseline: 45/56 (11/8) Goal status: ONGOING  ASSESSMENT:  CLINICAL IMPRESSION: Patient made great progress towards goals assessed this visit and is demonstrating that he would benefit from further skilled PT services for gait and balance.  His BERG showed modest improvement, but patient would likely stand to benefit from further work on narrowed BOS and SLS as well as visually limited conditions to improve vestibular and proprioceptive awareness.  His gait mechanics and use of AD is also improving with pt to receive rollator in coming weeks.  PT to adjust and continue practicing safety with this device in coming visits.  OBJECTIVE IMPAIRMENTS: Abnormal gait, decreased activity tolerance, decreased balance, decreased cognition, difficulty walking, impaired sensation, improper body mechanics, and postural dysfunction.   ACTIVITY LIMITATIONS: carrying, lifting, bending, standing, squatting, stairs, transfers, reach over head, and locomotion level  PARTICIPATION LIMITATIONS: meal prep, cleaning, laundry, driving, shopping, and community activity  PERSONAL FACTORS: Age, Fitness, Past/current experiences, Time since onset  of injury/illness/exacerbation, and 1-2 comorbidities: dementia, neuropathy  are also affecting patient's functional outcome.   REHAB POTENTIAL: [place] PMH and personal factors  CLINICAL DECISION MAKING: Evolving/moderate complexity  EVALUATION COMPLEXITY: Moderate  PLAN:  PT FREQUENCY: 1x/week  PT DURATION: 8 weeks + 4 weeks (at re-cert) + 4 weeks (at re-cert 16/09/958)  PLANNED INTERVENTIONS: Therapeutic exercises, Therapeutic activity, Neuromuscular re-education, Balance training, Gait training, Patient/Family education, Self Care, Stair training, Vestibular training, DME instructions, and Re-evaluation  PLAN FOR NEXT SESSION:  Continue rollator management- decline control and brake management- has pt heard from [employer]?  Add to HEP for bilateral LE strength and balance, bending, and turning/direction changing.  Ball toss on foam beam lateral stepping, bean bag toss. Unsupported step taps.  Tandem walking.  Sadie Haber, PT, DPT 08/03/2023, 3:31 PM

## 2023-08-06 ENCOUNTER — Telehealth: Payer: Self-pay

## 2023-08-06 NOTE — Telephone Encounter (Signed)
Patient has not been seen in over a year. He needs o.v. for pre-op evaluation

## 2023-08-06 NOTE — Telephone Encounter (Signed)
Copied from CRM [id number]. Topic: General - Inquiry >> Aug 06, 2023  8:13 AM De Blanch wrote: Reason for CRM: Diane from [facility]. Calling to f/u on a med clearance request faxed to office 11/06. Will re-fax today.  Please advise.

## 2023-08-07 ENCOUNTER — Telehealth: Payer: Self-pay | Admitting: Family Medicine

## 2023-08-07 DIAGNOSIS — H2513 Age-related nuclear cataract, bilateral: Secondary | ICD-10-CM | POA: Diagnosis not present

## 2023-08-07 DIAGNOSIS — Z0100 Encounter for examination of eyes and vision without abnormal findings: Secondary | ICD-10-CM | POA: Diagnosis not present

## 2023-08-07 NOTE — Telephone Encounter (Signed)
LVM for patient to schedule an office visit. Patient hasn't been seen in over 49yr-per provider's request. ([facility] can schedule if patient calls back) Thank you

## 2023-08-07 NOTE — Telephone Encounter (Signed)
Tried to schedule an appointment with patient and was told all that ENT needed was the ok to put him back on hydrochlorothiazide for Menieres. They are asking if he still needs an appointment just for that. Please advise.

## 2023-08-08 NOTE — Telephone Encounter (Signed)
Cameron King w/ENT advised. Confirmed received forms.

## 2023-08-08 NOTE — Telephone Encounter (Signed)
That's fine, he can start back on hydrochlorothiazide.

## 2023-08-13 ENCOUNTER — Ambulatory Visit: Payer: PPO | Admitting: Physical Therapy

## 2023-08-13 ENCOUNTER — Encounter: Payer: Self-pay | Admitting: Physical Therapy

## 2023-08-13 DIAGNOSIS — R2681 Unsteadiness on feet: Secondary | ICD-10-CM

## 2023-08-13 DIAGNOSIS — Z9181 History of falling: Secondary | ICD-10-CM

## 2023-08-13 DIAGNOSIS — R2689 Other abnormalities of gait and mobility: Secondary | ICD-10-CM

## 2023-08-13 DIAGNOSIS — M6281 Muscle weakness (generalized): Secondary | ICD-10-CM

## 2023-08-13 NOTE — Therapy (Signed)
OUTPATIENT PHYSICAL THERAPY NEURO TREATMENT   Patient Name: Cameron King MRN: [id number] DOB:04/30/1946, 77 y.o., male Today's Date: 08/13/2023   PCP: Malva Limes, MD REFERRING PROVIDER: Elinor Parkinson, [place]  END OF SESSION:   PT End of Session - 08/13/23 1154     Visit Number 13    Number of Visits 17   13 + 4   Date for PT Re-Evaluation 09/07/23   pushed out due to scheduling delay   Authorization Type [employer]    PT Start Time 1140    PT Stop Time 1230    PT Time Calculation (min) 50 min    Equipment Utilized During Treatment Gait belt    Activity Tolerance Patient tolerated treatment well    Behavior During Therapy WFL for tasks assessed/performed            Past Medical History:  Diagnosis Date   Depression    High cholesterol    Hypertension    Meniere disease    Ocular Migraines    Shingles 07/16/2015   of eye    Past Surgical History:  Procedure Laterality Date   COLONOSCOPY     COLONOSCOPY WITH PROPOFOL N/A 10/29/2018   Procedure: COLONOSCOPY WITH PROPOFOL;  Surgeon: Midge Minium, MD;  Location: [facility] ENDOSCOPY;  Service: Endoscopy;  Laterality: N/A;   SIGMOIDOSCOPY  2008   Dr. Evette Cristal. Normal per patient report   Patient Active Problem List   Diagnosis Date Noted   Anxiety, mild 10/04/2021   Difficulty walking 10/04/2021   Hearing deficit, bilateral 10/04/2021   Numbness and tingling 10/04/2021   Sensory ataxia 10/04/2021   Numbness and tingling of both feet 02/08/2021   GAD (generalized anxiety disorder) 02/04/2020   Confusion 02/04/2020   History of colonic polyps    Chronic pain of left ankle 10/07/2018   Bilateral hearing loss 08/21/2018   Cerebral ventriculomegaly 08/21/2018   Chronic pain of right knee 10/02/2016   Enthesopathy of hip 08/02/2009   Rotator cuff syndrome 10/04/2008   Chronic infection of sinus 03/30/2008   HLD (hyperlipidemia) 04/09/2007   Former smoker, stopped smoking in distant past 11/23/2005    Allergic rhinitis 08/23/2005   Depressive disorder 08/23/2005   Impotence of organic origin 08/23/2005   Migraine without status migrainosus 08/23/2005    ONSET DATE: a couple years ago  REFERRING DIAG: R20.0,R20.2 (ICD-10-CM) - Numbness and tingling of both feet R26.9 (ICD-10-CM) - Abnormality of gait R26.89 (ICD-10-CM) - Balance problem Z91.81 (ICD-10-CM) - At high risk for falls  THERAPY DIAG:  History of falling  Unsteadiness on feet  Muscle weakness (generalized)  Other abnormalities of gait and mobility  Rationale for Evaluation and Treatment: Rehabilitation  SUBJECTIVE:  SUBJECTIVE STATEMENT: He states he is having mild right hip/low back soreness today that started yesterday after he took a step out of the passenger side of a car and into the grass falling onto his knees.  This was the first fall he has had in almost 3 months (3 days shy of 3 months fall free).  He states he used the car window ledge and help from a family friend to get up.  Arline Asp states it took him a few minutes to recall sequencing.  She endorses they will keep working on this.  He also had a slip in the shower where he stepped in without using shower bench in an area where the slip-resistant mat does not cover, but he did not hit the shower floor.  He has had his vision checked and does not need glasses.  They did receive a phone call from PCP regarding scheduling for procedure, but Arline Asp is unsure what procedure - possibly related to ENT visit?  He was put on new medication for Meniere's by ENT.  Pt wants to work on floor recovery today. Pt accompanied by: self and family member-daughter-in-law [name]  PERTINENT HISTORY: Depression, cerebral ventriculomegaly, HLD, dementia, bilateral hearing loss, GAD, neuropathy  PAIN:  Are  you having pain? Yes: NPRS scale: 2-3/10 Pain location: right hip/low back Pain description: sore Aggravating factors: twisting to left Relieving factors: sitting still    PRECAUTIONS: Fall  RED FLAGS: None - pt has urge incontinence  WEIGHT BEARING RESTRICTIONS: No  FALLS: Has patient fallen in last 6 months? Yes. 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  Daughter-in-law endorses move to her home is long-term.  OBJECTIVE:   DIAGNOSTIC FINDINGS: No recent relevant imaging.  COGNITION: Overall cognitive status: History of cognitive impairments - at baseline-pt has STM deficits, increased processing time, and delayed recall related to his dementia   SENSATION: Light touch: WFL and N/T from ankle line down into toes  COORDINATION: LE RAMS:  WFL Heel-to-shin:  WFL bilaterally  EDEMA:  None noted in BLE  MUSCLE TONE: None noted in BLE, no clonus present bilaterally  POSTURE: forward head and weight shift right  LOWER EXTREMITY ROM:     Active  Right Eval Left Eval  Hip flexion Grossly WFL  Hip extension   Hip abduction   Hip adduction   Hip internal rotation   Hip external rotation   Knee flexion   Knee extension   Ankle dorsiflexion   Ankle plantarflexion   Ankle inversion    Ankle eversion     (Blank rows = not tested)  LOWER EXTREMITY MMT:    MMT Right Eval Left Eval  Hip flexion    Hip extension    Hip abduction    Hip adduction    Hip internal rotation    Hip external rotation    Knee flexion    Knee extension    Ankle dorsiflexion    Ankle plantarflexion     Ankle inversion    Ankle eversion    (Blank rows = not tested)  BED MOBILITY:  Sit to supine Complete Independence Supine to sit Complete  Independence Rolling to Right Complete Independence Rolling to Left Complete Independence  TRANSFERS: Assistive device utilized: Quad cane small base  Sit to stand: SBA and CGA Stand to sit: SBA and CGA Chair to chair: SBA and CGA Floor:  varies based on patient cognition and positional preference (pt prefers posterior bump vs quadruped crawl)  GAIT: Gait pattern: step to pattern, step through pattern, decreased arm swing- Right, decreased arm swing- Left, decreased stride length, shuffling, and narrow BOS Distance walked: various clinic distances Assistive device utilized: Quad cane small base Level of assistance: SBA and CGA Comments: Patient demonstrates progressive decrease in step size, but not like that of a festination pattern.  He tends to carry the cane vs using it creating trip hazard.  PT attempted to cue patient for gait pattern at end of session w/ inconsistent response to intervention.  Did discuss with patient and daughter the cognitive load of using a cane and sometimes creating a fall risk due to these difficulties, but did not discourage trying to utilize safe gait pattern with NBQC option at home until further assessment and gait training can be completed.  FUNCTIONAL TESTS:  5 times sit to stand: 21.90 seconds w/ BUE support, pt requires repeat simple commands Timed up and go (TUG): To be assessed. 10 meter walk test: To be assessed. Berg Balance Scale: To be assessed.  PATIENT SURVEYS:  None completed due to cognitive demand.  TODAY'S TREATMENT:                                                                                                                              DATE: 08/13/2023 -Discussed scheduling yearly PCP visit and having neurologist and PCP note need for rollator in notes for [employer] if MD in agreement and  following up with [employer]. -PT briefly assessed skin intergrity and appearance of bilateral knees and ankles - nothing of note.  He denies direct hip joint pain and did not fall onto hip or bottom. -Floor Recovery: Patient educated in floor recovery this visit using teach-back for injury assessment and sequencing of task in clinic setting.  Discussion of transfer of skills to variable scenarios outside the clinic.  Patient has most difficulty with turning to sit on EOM.  Performed 2 times - bottom bump (pt preference for when he slides out of low bed) and forward split stance into stand pivot to sit. Caregiver Training:  Caregiver present: verbalized techniques and safety questions - PT educated on concerns and utilizing different techniques for higher heights of support surface as pt prefers bottom bump technique, but this only works for bed height in home .   Level of Assist:  Verbal/tactile cues and SBA/CGA.   -Lateral stepping on foam beam w/ lightweight ball toss -Standing in place on foam beam w/ ball toss, working on anterior weight shifting as pt has little to no righting reactions with posterior LOB even with foot placement adjustments -6lb ball toss standing on  firm ground narrowed BOS, pt needing repetition to work on force production with task -Standing on decline w/ pt demonstrating mild sway during ball toss -Standing on incline w/ pt having difficulty progressing to no UE support, able to get to this point with minA posteriorly over several minutes  PATIENT EDUCATION: Education details:  Continue walking program and HEP.  Contact [employer] about rollator, follow-up with PCP and neurologist as planned - discussed rollator benefits and noting this in note if in agreement. Person educated: Patient and Caregiver daughter-in-law Arline Asp Education method: Explanation Education comprehension: verbalized understanding and needs further education  HOME EXERCISE PROGRAM: Access Code: [email or web address] URL:  [email or web address] Date: 04/30/2023 Prepared by: Camille Bal  Exercises - Sit to Stand with Armchair  - 1 x daily - 7 x weekly - 2 sets - 6 reps - Standing March with Counter Support  - 1 x daily - 7 x weekly - 2 sets - 20 reps - Standing with Head Rotation  - 1 x daily - 7 x weekly - 3 sets - 10 reps - Feet Together Balance at [employer] Eyes Open  - 1 x daily - 7 x weekly - 1 sets - 3 reps - 20-30 seconds hold - Corner Balance Feet Together With Eyes Closed  - 1 x daily - 7 x weekly - 1 sets - 3 reps - 15-20 seconds hold - Standing Tandem Balance with Counter Support  - 1 x daily - 7 x weekly - 1 sets - 4 reps - 30 seconds hold  Provided fall prevention handout 04/30/2023.  You Can Walk For A Certain Length Of Time Each Day (with family and use AD for safe endurance)                          Walk 4 minutes 3 times per day.             Increase 1-2  minutes every week.             Work up to 15 minutes (1-2 times per day).               Example:                         Day 1-2           4-5 minutes     3 times per day                         Day 7-8           10-12 minutes 2-3 times per day                         Day 13-14       20-22 minutes 1-2 times per day  GOALS: Goals reviewed with patient? Yes  LONG TERM GOALS (at re-cert): Target date: 07/20/2023  Pt will be independent and compliant with BLE strengthening and balance focused HEP in order to maintain functional progress and improve mobility. Baseline: Compliant w/ supervision (9/24); compliant and IND per caregiver (11/8) Goal status: MET  2.  Patient will be compliant to formal walking program >/= 4 days per week w/ supervision from family to improve aerobic tolerance and ambulatory mechanics w/ appropriate AD. Baseline: Patient has not been compliant recently due to caregiver illness (9/24); at least 5 per  caregiver (11/8) Goal status: MET  3.  PT to further assess rollator use outdoors over  unlevel surfaces w/ order request placed as appropriate. Baseline: Order placed (11/5) Goal status: MET  4.  Pt will demonstrate TUG of </=13 seconds in order to decrease risk of falls and improve functional mobility using LRAD. Baseline: 28.53 seconds w/ SBQC (7/30); 17.15 seconds w/ SPC (9/24); 14.29 sec w/ SPC SBA (11/8) Goal status: PARTIALLY MET  5.  Pt will demonstrate a gait speed of >/=2.27 feet/sec in order to decrease risk for falls. Baseline: 0.55 m/sec OR 1.82 ft/sec (7/30); 2.07 ft/sec (9/24); 2.81 ft/sec w/ SPC SBA (11/8) Goal status: MET  6.  Pt will increase BERG balance score to >/=49/56 to demonstrate improved static balance. Baseline: 32/56 (7/30);  44/56 (9/24); 45/56 (11/8) Goal status: NOT MET  LONG TERM GOALS (at re-cert): Target date: 08/31/2023  1.  Pt will demonstrate TUG of </=13 seconds in order to decrease risk of falls and improve functional mobility using LRAD. Baseline: 14.29 sec w/ SPC SBA - average (11/8) Goal status: ONGOING  2.  Pt will demonstrate a gait speed of >/=3.01 feet/sec in order to decrease risk for falls. Baseline: 2.81 ft/sec w/ SPC SBA (11/8) Goal status: REVISED  3.  Pt will increase BERG balance score to >/=49/56 to demonstrate improved static balance. Baseline: 45/56 (11/8) Goal status: ONGOING  ASSESSMENT:  CLINICAL IMPRESSION: Reviewed floor recovery this visit with pt doing well with both posterior bump and stride stance stand pivot, but needing increased time and variable cuing.  He was very challenged by tasks utilizing hip and ankle strategy demonstrating ongoing difficulty with anterior weight shifting.  Will continue per POC and follow-up with [employer] on behalf of pt and caregiver at next visit if still no progress made on obtaining rollator.  OBJECTIVE IMPAIRMENTS: Abnormal gait, decreased activity tolerance, decreased balance, decreased cognition, difficulty walking, impaired sensation, improper body mechanics, and  postural dysfunction.   ACTIVITY LIMITATIONS: carrying, lifting, bending, standing, squatting, stairs, transfers, reach over head, and locomotion level  PARTICIPATION LIMITATIONS: meal prep, cleaning, laundry, driving, shopping, and community activity  PERSONAL FACTORS: Age, Fitness, Past/current experiences, Time since onset of injury/illness/exacerbation, and 1-2 comorbidities: dementia, neuropathy  are also affecting patient's functional outcome.   REHAB POTENTIAL: Fair See PMH and personal factors  CLINICAL DECISION MAKING: Evolving/moderate complexity  EVALUATION COMPLEXITY: Moderate  PLAN:  PT FREQUENCY: 1x/week  PT DURATION: 8 weeks + 4 weeks (at re-cert) + 4 weeks (at re-cert 16/09/958)  PLANNED INTERVENTIONS: Therapeutic exercises, Therapeutic activity, Neuromuscular re-education, Balance training, Gait training, Patient/Family education, Self Care, Stair training, Vestibular training, DME instructions, and Re-evaluation  PLAN FOR NEXT SESSION:  Add to HEP for bilateral LE strength and balance, bending, and turning/direction changing.  bean bag toss. Unsupported step taps.  Tandem walking.  PRINT FLOOR RECOVERY TECHNIQUE PICTURES - PT locked out of [employer] 11/18.  Update on rollator?  Sadie Haber, PT, DPT 08/13/2023, 1:11 PM

## 2023-08-15 DIAGNOSIS — R262 Difficulty in walking, not elsewhere classified: Secondary | ICD-10-CM | POA: Diagnosis not present

## 2023-08-15 DIAGNOSIS — R42 Dizziness and giddiness: Secondary | ICD-10-CM | POA: Diagnosis not present

## 2023-08-15 DIAGNOSIS — R202 Paresthesia of skin: Secondary | ICD-10-CM | POA: Diagnosis not present

## 2023-08-15 DIAGNOSIS — G629 Polyneuropathy, unspecified: Secondary | ICD-10-CM | POA: Diagnosis not present

## 2023-08-15 DIAGNOSIS — R278 Other lack of coordination: Secondary | ICD-10-CM | POA: Diagnosis not present

## 2023-08-15 DIAGNOSIS — Z9181 History of falling: Secondary | ICD-10-CM | POA: Diagnosis not present

## 2023-08-15 DIAGNOSIS — R200 Anesthesia of skin: Secondary | ICD-10-CM | POA: Diagnosis not present

## 2023-08-17 ENCOUNTER — Other Ambulatory Visit: Payer: Self-pay | Admitting: Family Medicine

## 2023-08-17 DIAGNOSIS — E7800 Pure hypercholesterolemia, unspecified: Secondary | ICD-10-CM

## 2023-08-21 ENCOUNTER — Ambulatory Visit: Payer: PPO | Admitting: Physical Therapy

## 2023-08-21 ENCOUNTER — Encounter: Payer: Self-pay | Admitting: Physical Therapy

## 2023-08-21 DIAGNOSIS — R200 Anesthesia of skin: Secondary | ICD-10-CM

## 2023-08-21 DIAGNOSIS — Z9181 History of falling: Secondary | ICD-10-CM | POA: Diagnosis not present

## 2023-08-21 DIAGNOSIS — M6281 Muscle weakness (generalized): Secondary | ICD-10-CM

## 2023-08-21 DIAGNOSIS — R2681 Unsteadiness on feet: Secondary | ICD-10-CM

## 2023-08-21 DIAGNOSIS — R2689 Other abnormalities of gait and mobility: Secondary | ICD-10-CM

## 2023-08-21 NOTE — Therapy (Signed)
OUTPATIENT PHYSICAL THERAPY NEURO TREATMENT   Patient Name: Cameron King MRN: [id number] DOB:05/27/1946, 77 y.o., male Today's Date: 08/21/2023   PCP: Malva Limes, MD REFERRING PROVIDER: Elinor Parkinson, [place]  END OF SESSION:   PT End of Session - 08/21/23 1156     Visit Number 14    Number of Visits 17   13 + 4   Date for PT Re-Evaluation 09/07/23   pushed out due to scheduling delay   Authorization Type [employer]    PT Start Time 1147    PT Stop Time 1229    PT Time Calculation (min) 42 min    Equipment Utilized During Treatment Gait belt    Activity Tolerance Patient tolerated treatment well    Behavior During Therapy WFL for tasks assessed/performed            Past Medical History:  Diagnosis Date   Depression    High cholesterol    Hypertension    Meniere disease    Ocular Migraines    Shingles 07/16/2015   of eye    Past Surgical History:  Procedure Laterality Date   COLONOSCOPY     COLONOSCOPY WITH PROPOFOL N/A 10/29/2018   Procedure: COLONOSCOPY WITH PROPOFOL;  Surgeon: Midge Minium, MD;  Location: [facility] ENDOSCOPY;  Service: Endoscopy;  Laterality: N/A;   SIGMOIDOSCOPY  2008   Dr. Evette Cristal. Normal per patient report   Patient Active Problem List   Diagnosis Date Noted   Anxiety, mild 10/04/2021   Difficulty walking 10/04/2021   Hearing deficit, bilateral 10/04/2021   Numbness and tingling 10/04/2021   Sensory ataxia 10/04/2021   Numbness and tingling of both feet 02/08/2021   GAD (generalized anxiety disorder) 02/04/2020   Confusion 02/04/2020   History of colonic polyps    Chronic pain of left ankle 10/07/2018   Bilateral hearing loss 08/21/2018   Cerebral ventriculomegaly 08/21/2018   Chronic pain of right knee 10/02/2016   Enthesopathy of hip 08/02/2009   Rotator cuff syndrome 10/04/2008   Chronic infection of sinus 03/30/2008   HLD (hyperlipidemia) 04/09/2007   Former smoker, stopped smoking in distant past 11/23/2005    Allergic rhinitis 08/23/2005   Depressive disorder 08/23/2005   Impotence of organic origin 08/23/2005   Migraine without status migrainosus 08/23/2005    ONSET DATE: a couple years ago  REFERRING DIAG: R20.0,R20.2 (ICD-10-CM) - Numbness and tingling of both feet R26.9 (ICD-10-CM) - Abnormality of gait R26.89 (ICD-10-CM) - Balance problem Z91.81 (ICD-10-CM) - At high risk for falls  THERAPY DIAG:  History of falling  Unsteadiness on feet  Muscle weakness (generalized)  Other abnormalities of gait and mobility  Anesthesia of skin  Rationale for Evaluation and Treatment: Rehabilitation  SUBJECTIVE:  SUBJECTIVE STATEMENT: He states his neck has been bothering him for the past few days, unsure why, but having mild response to muscle rubs.  He has not tried heat.  They have not received rollator.  He ambulates in with [facility] today.  He denies falls or acute changes.  They have been practicing fall recovery with family at home. Pt accompanied by: self and family member-daughter-in-law [name]  PERTINENT HISTORY: Depression, cerebral ventriculomegaly, HLD, dementia, bilateral hearing loss, GAD, neuropathy  PAIN:  Are you having pain? Yes: NPRS scale: 2/10 Pain location: neck Pain description: sore Aggravating factors: looking around Relieving factors: sitting still    PRECAUTIONS: Fall  RED FLAGS: None - pt has urge incontinence  WEIGHT BEARING RESTRICTIONS: No  FALLS: Has patient fallen in last 6 months? Yes. 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  Daughter-in-law endorses move to her home is long-term.  OBJECTIVE:   DIAGNOSTIC FINDINGS: No recent relevant imaging.  COGNITION: Overall cognitive status: History of cognitive impairments - at baseline-pt has STM deficits, increased processing time, and delayed recall related to his dementia   SENSATION: Light touch: WFL and N/T from ankle line down into toes  COORDINATION: LE RAMS:  WFL Heel-to-shin:  WFL bilaterally  EDEMA:  None noted in BLE  MUSCLE TONE: None noted in BLE, no clonus present bilaterally  POSTURE: forward head and weight shift right  LOWER EXTREMITY ROM:     Active  Right Eval Left Eval  Hip flexion Grossly WFL  Hip extension   Hip abduction   Hip adduction   Hip internal rotation   Hip external rotation   Knee flexion   Knee extension   Ankle dorsiflexion   Ankle plantarflexion   Ankle inversion    Ankle eversion     (Blank rows = not tested)  LOWER EXTREMITY MMT:    MMT Right Eval Left Eval  Hip flexion    Hip extension    Hip abduction    Hip adduction    Hip internal rotation    Hip external rotation    Knee flexion    Knee extension    Ankle dorsiflexion    Ankle plantarflexion    Ankle inversion    Ankle eversion    (Blank rows = not tested)  BED MOBILITY:  Sit to supine Complete Independence Supine to sit Complete Independence Rolling to Right Complete Independence Rolling to Left Complete Independence  TRANSFERS: Assistive device utilized: Quad cane small base  Sit to stand: SBA and CGA Stand to sit: SBA and CGA Chair to chair: SBA and CGA Floor:  varies based on patient cognition and positional preference (pt prefers posterior bump vs quadruped crawl)  GAIT: Gait pattern: step to pattern, step through pattern, decreased arm swing- Right, decreased arm swing-  Left, decreased stride length, shuffling, and narrow BOS Distance walked: various clinic distances Assistive device utilized: Quad cane small base Level of assistance: SBA and CGA Comments: Patient demonstrates progressive decrease in step size, but not like that of a festination pattern.  He tends to carry the cane vs using it creating trip hazard.  PT attempted  to cue patient for gait pattern at end of session w/ inconsistent response to intervention.  Did discuss with patient and daughter the cognitive load of using a cane and sometimes creating a fall risk due to these difficulties, but did not discourage trying to utilize safe gait pattern with NBQC option at home until further assessment and gait training can be completed.  FUNCTIONAL TESTS:  5 times sit to stand: 21.90 seconds w/ BUE support, pt requires repeat simple commands Timed up and go (TUG): To be assessed. 10 meter walk test: To be assessed. Berg Balance Scale: To be assessed.  PATIENT SURVEYS:  None completed due to cognitive demand.  TODAY'S TREATMENT:                                                                                                                              DATE: 08/21/2023 -Discussed rollator follow-up from PT, [employer] informed pt caregiver they do not do rollators.  PT was not informed of this and will follow-up with company. -Provided and reviewed fall recovery handout from last session. -Unsupported 6" step taps CGA alternating LE x20 -LE elevated in semi-tandem w/ CGA tossing bean bags to far target -Tandem walking 2x12 ft w/ CGA and SPC, cuing for error correction -Lateral stepping on foam beam w/ CGA and ball toss 2x10 ft, increased processing time and intermittent touch assist to bars during stepping  PATIENT EDUCATION: Education details:  Continue walking program and HEP.  PT will follow-up regarding rollator order. Person educated: Patient and Caregiver daughter-in-law Arline Asp Education method:  Explanation Education comprehension: verbalized understanding and needs further education  HOME EXERCISE PROGRAM: Access Code: [email or web address] URL: [email or web address] Date: 04/30/2023 Prepared by: Camille Bal  Exercises - Sit to Stand with Armchair  - 1 x daily - 7 x weekly - 2 sets - 6 reps - Standing March with Counter Support  - 1 x daily - 7 x weekly - 2 sets - 20 reps - Standing with Head Rotation  - 1 x daily - 7 x weekly - 3 sets - 10 reps - Feet Together Balance at [employer] Eyes Open  - 1 x daily - 7 x weekly - 1 sets - 3 reps - 20-30 seconds hold - Corner Balance Feet Together With Eyes Closed  - 1 x daily - 7 x weekly - 1 sets - 3 reps - 15-20 seconds hold - Standing Tandem Balance with Counter Support  - 1 x daily - 7 x weekly - 1 sets - 4 reps - 30 seconds hold  Provided fall prevention handout 04/30/2023.  You Can Walk For A Certain Length Of Time Each Day (with family and use AD for safe endurance)                          Walk 4 minutes 3 times per day.  Increase 1-2  minutes every week.             Work up to 15 minutes (1-2 times per day).               Example:                         Day 1-2           4-5 minutes     3 times per day                         Day 7-8           10-12 minutes 2-3 times per day                         Day 13-14       20-22 minutes 1-2 times per day  GOALS: Goals reviewed with patient? Yes  LONG TERM GOALS (at re-cert): Target date: 07/20/2023  Pt will be independent and compliant with BLE strengthening and balance focused HEP in order to maintain functional progress and improve mobility. Baseline: Compliant w/ supervision (9/24); compliant and IND per caregiver (11/8) Goal status: MET  2.  Patient will be compliant to formal walking program >/= 4 days per week w/ supervision from family to improve aerobic tolerance and ambulatory mechanics w/ appropriate AD. Baseline: Patient has not been compliant  recently due to caregiver illness (9/24); at least 5 per caregiver (11/8) Goal status: MET  3.  PT to further assess rollator use outdoors over unlevel surfaces w/ order request placed as appropriate. Baseline: Order placed (11/5) Goal status: MET  4.  Pt will demonstrate TUG of </=13 seconds in order to decrease risk of falls and improve functional mobility using LRAD. Baseline: 28.53 seconds w/ SBQC (7/30); 17.15 seconds w/ SPC (9/24); 14.29 sec w/ SPC SBA (11/8) Goal status: PARTIALLY MET  5.  Pt will demonstrate a gait speed of >/=2.27 feet/sec in order to decrease risk for falls. Baseline: 0.55 m/sec OR 1.82 ft/sec (7/30); 2.07 ft/sec (9/24); 2.81 ft/sec w/ SPC SBA (11/8) Goal status: MET  6.  Pt will increase BERG balance score to >/=49/56 to demonstrate improved static balance. Baseline: 32/56 (7/30);  44/56 (9/24); 45/56 (11/8) Goal status: NOT MET  LONG TERM GOALS (at re-cert): Target date: 08/31/2023  1.  Pt will demonstrate TUG of </=13 seconds in order to decrease risk of falls and improve functional mobility using LRAD. Baseline: 14.29 sec w/ SPC SBA - average (11/8) Goal status: ONGOING  2.  Pt will demonstrate a gait speed of >/=3.01 feet/sec in order to decrease risk for falls. Baseline: 2.81 ft/sec w/ SPC SBA (11/8) Goal status: REVISED  3.  Pt will increase BERG balance score to >/=49/56 to demonstrate improved static balance. Baseline: 45/56 (11/8) Goal status: ONGOING  ASSESSMENT:  CLINICAL IMPRESSION: Focus of skilled session on continuing to challenge patient's narrowed BOS and SLS.  He fatigues quickly with all tasks today and is especially challenged by LLE in SL weight bearing reporting some discomfort in this foot.  This discomfort is not new so PT continued per session plan.  He continues to benefit from skilled PT for rollator setup when able to obtain and for further progression of safe upright mobility.  OBJECTIVE IMPAIRMENTS: Abnormal gait,  decreased activity tolerance, decreased balance, decreased cognition, difficulty walking, impaired sensation, improper body  mechanics, and postural dysfunction.   ACTIVITY LIMITATIONS: carrying, lifting, bending, standing, squatting, stairs, transfers, reach over head, and locomotion level  PARTICIPATION LIMITATIONS: meal prep, cleaning, laundry, driving, shopping, and community activity  PERSONAL FACTORS: Age, Fitness, Past/current experiences, Time since onset of injury/illness/exacerbation, and 1-2 comorbidities: dementia, neuropathy  are also affecting patient's functional outcome.   REHAB POTENTIAL: [place] PMH and personal factors  CLINICAL DECISION MAKING: Evolving/moderate complexity  EVALUATION COMPLEXITY: Moderate  PLAN:  PT FREQUENCY: 1x/week  PT DURATION: 8 weeks + 4 weeks (at re-cert) + 4 weeks (at re-cert 16/09/958)  PLANNED INTERVENTIONS: Therapeutic exercises, Therapeutic activity, Neuromuscular re-education, Balance training, Gait training, Patient/Family education, Self Care, Stair training, Vestibular training, DME instructions, and Re-evaluation  PLAN FOR NEXT SESSION:  Add to HEP for bilateral LE strength and balance, bending, and turning/direction changing.  bean bag toss standing on compliant surface.  Elevated SLS rebounder w/ lightweight ball at wall?  STS on incline/standing on incline - ball toss/balloon tap.  Update on rollator?  Sadie Haber, PT, DPT 08/21/2023, 12:33 PM

## 2023-08-27 ENCOUNTER — Encounter: Payer: Self-pay | Admitting: Physical Therapy

## 2023-08-27 ENCOUNTER — Ambulatory Visit: Payer: PPO | Attending: Podiatry | Admitting: Physical Therapy

## 2023-08-27 DIAGNOSIS — R2689 Other abnormalities of gait and mobility: Secondary | ICD-10-CM | POA: Diagnosis not present

## 2023-08-27 DIAGNOSIS — Z9181 History of falling: Secondary | ICD-10-CM | POA: Insufficient documentation

## 2023-08-27 DIAGNOSIS — R2681 Unsteadiness on feet: Secondary | ICD-10-CM | POA: Insufficient documentation

## 2023-08-27 DIAGNOSIS — M6281 Muscle weakness (generalized): Secondary | ICD-10-CM | POA: Insufficient documentation

## 2023-08-27 NOTE — Therapy (Signed)
OUTPATIENT PHYSICAL THERAPY NEURO TREATMENT   Patient Name: Cameron King MRN: [id number] DOB:1946/06/11, 77 y.o., male Today's Date: 08/27/2023   PCP: Malva Limes, MD REFERRING PROVIDER: Elinor Parkinson, [place]  END OF SESSION:   PT End of Session - 08/27/23 0938     Visit Number 15    Number of Visits 17   13 + 4   Date for PT Re-Evaluation 09/07/23   pushed out due to scheduling delay   Authorization Type [employer]    PT Start Time 4098    PT Stop Time 1015    PT Time Calculation (min) 41 min    Equipment Utilized During Treatment Gait belt    Activity Tolerance Patient tolerated treatment well    Behavior During Therapy [facility] for tasks assessed/performed            Past Medical History:  Diagnosis Date   Depression    High cholesterol    Hypertension    Meniere disease    Ocular Migraines    Shingles 07/16/2015   of eye    Past Surgical History:  Procedure Laterality Date   COLONOSCOPY     COLONOSCOPY WITH PROPOFOL N/A 10/29/2018   Procedure: COLONOSCOPY WITH PROPOFOL;  Surgeon: Midge Minium, MD;  Location: [facility] ENDOSCOPY;  Service: Endoscopy;  Laterality: N/A;   SIGMOIDOSCOPY  2008   Dr. Evette Cristal. Normal per patient report   Patient Active Problem List   Diagnosis Date Noted   Anxiety, mild 10/04/2021   Difficulty walking 10/04/2021   Hearing deficit, bilateral 10/04/2021   Numbness and tingling 10/04/2021   Sensory ataxia 10/04/2021   Numbness and tingling of both feet 02/08/2021   GAD (generalized anxiety disorder) 02/04/2020   Confusion 02/04/2020   History of colonic polyps    Chronic pain of left ankle 10/07/2018   Bilateral hearing loss 08/21/2018   Cerebral ventriculomegaly 08/21/2018   Chronic pain of right knee 10/02/2016   Enthesopathy of hip 08/02/2009   Rotator cuff syndrome 10/04/2008   Chronic infection of sinus 03/30/2008   HLD (hyperlipidemia) 04/09/2007   Former smoker, stopped smoking in distant past 11/23/2005    Allergic rhinitis 08/23/2005   Depressive disorder 08/23/2005   Impotence of organic origin 08/23/2005   Migraine without status migrainosus 08/23/2005    ONSET DATE: a couple years ago  REFERRING DIAG: R20.0,R20.2 (ICD-10-CM) - Numbness and tingling of both feet R26.9 (ICD-10-CM) - Abnormality of gait R26.89 (ICD-10-CM) - Balance problem Z91.81 (ICD-10-CM) - At high risk for falls  THERAPY DIAG:  History of falling  Unsteadiness on feet  Muscle weakness (generalized)  Other abnormalities of gait and mobility  Rationale for Evaluation and Treatment: Rehabilitation  SUBJECTIVE:  SUBJECTIVE STATEMENT: He states his neck continues to bother him a little bit.  He states he is sleeping good.  He ambulates in with [facility] today.  He denies falls or acute changes. Pt accompanied by: self and family member-daughter-in-law [name]  PERTINENT HISTORY: Depression, cerebral ventriculomegaly, HLD, dementia, bilateral hearing loss, GAD, neuropathy  PAIN:  Are you having pain? Yes: NPRS scale: 2/10 Pain location: neck Pain description: sore Aggravating factors: looking around Relieving factors: sitting still    PRECAUTIONS: Fall  RED FLAGS: None - pt has urge incontinence  WEIGHT BEARING RESTRICTIONS: No  FALLS: Has patient fallen in last 6 months? Yes. 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  Daughter-in-law endorses  move to her home is long-term.  OBJECTIVE:   DIAGNOSTIC FINDINGS: No recent relevant imaging.  COGNITION: Overall cognitive status: History of cognitive impairments - at baseline-pt has STM deficits, increased processing time, and delayed recall related to his dementia   SENSATION: Light touch: WFL and N/T from ankle line down into toes  COORDINATION: LE RAMS:  WFL Heel-to-shin:  WFL bilaterally  EDEMA:  None noted in BLE  MUSCLE TONE: None noted in BLE, no clonus present bilaterally  POSTURE: forward head and weight shift right  LOWER EXTREMITY ROM:     Active  Right Eval Left Eval  Hip flexion Grossly WFL  Hip extension   Hip abduction   Hip adduction   Hip internal rotation   Hip external rotation   Knee flexion   Knee extension   Ankle dorsiflexion   Ankle plantarflexion   Ankle inversion    Ankle eversion     (Blank rows = not tested)  LOWER EXTREMITY MMT:    MMT Right Eval Left Eval  Hip flexion    Hip extension    Hip abduction    Hip adduction    Hip internal rotation    Hip external rotation    Knee flexion    Knee extension    Ankle dorsiflexion    Ankle plantarflexion    Ankle inversion    Ankle eversion    (Blank rows = not tested)  BED MOBILITY:  Sit to supine Complete Independence Supine to sit Complete Independence Rolling to Right Complete Independence Rolling to Left Complete Independence  TRANSFERS: Assistive device utilized: Quad cane small base  Sit to stand: SBA and CGA Stand to sit: SBA and CGA Chair to chair: SBA and CGA Floor:  varies based on patient cognition and positional preference (pt prefers posterior bump vs quadruped crawl)  GAIT: Gait pattern: step to pattern, step through pattern, decreased arm swing- Right, decreased arm swing- Left, decreased stride length, shuffling, and narrow BOS Distance walked: various clinic distances Assistive device utilized: Quad cane small base Level of assistance: SBA and  CGA Comments: Patient demonstrates progressive decrease in step size, but not like that of a festination pattern.  He tends to carry the cane vs using it creating trip hazard.  PT attempted to cue patient for gait pattern at end of session w/ inconsistent response to intervention.  Did discuss with patient and daughter the cognitive load of using  a cane and sometimes creating a fall risk due to these difficulties, but did not discourage trying to utilize safe gait pattern with NBQC option at home until further assessment and gait training can be completed.  FUNCTIONAL TESTS:  5 times sit to stand: 21.90 seconds w/ BUE support, pt requires repeat simple commands Timed up and go (TUG): To be assessed. 10 meter walk test: To be assessed. Berg Balance Scale: To be assessed.  PATIENT SURVEYS:  None completed due to cognitive demand.  TODAY'S TREATMENT:                                                                                                                              DATE: 08/27/2023 -Updated on referral for rollator forwarded to [facility], provided contact for daughter to follow-up at end of week. -Standing normal BOS on airex:  midline bean bag toss > lateral bean bag toss alternating UE and rotating trunk for increased ankle and hip strategy using external focused target -6" alternating elevated SLS w/ red unweighted ball rebounder 2 rounds to LOB each LE, mod cuing for maintaining midline COM and weight-shifting when losing balance -STS w/ toes elevated on incline x12, x3, time spent educating against use of momentum and proper sequencing of weight shift into standing and sitting, multimodal cuing and CGA-minA provided throughout  PATIENT EDUCATION: Education details:  Continue walking program and HEP.   Person educated: Patient and Caregiver daughter-in-law Arline Asp Education method: Explanation Education comprehension: verbalized understanding and needs further education  HOME EXERCISE  PROGRAM: Access Code: [email or web address] URL: [email or web address] Date: 04/30/2023 Prepared by: Camille Bal  Exercises - Sit to Stand with Armchair  - 1 x daily - 7 x weekly - 2 sets - 6 reps - Standing March with Counter Support  - 1 x daily - 7 x weekly - 2 sets - 20 reps - Standing with Head Rotation  - 1 x daily - 7 x weekly - 3 sets - 10 reps - Feet Together Balance at [employer] Eyes Open  - 1 x daily - 7 x weekly - 1 sets - 3 reps - 20-30 seconds hold - Corner Balance Feet Together With Eyes Closed  - 1 x daily - 7 x weekly - 1 sets - 3 reps - 15-20 seconds hold - Standing Tandem Balance with Counter Support  - 1 x daily - 7 x weekly - 1 sets - 4 reps - 30 seconds hold  Provided fall prevention handout 04/30/2023.  You Can Walk For A Certain Length Of Time Each Day (with family and use AD for safe endurance)                          Walk 4 minutes 3 times per day.             Increase 1-2  minutes every week.  Work up to 15 minutes (1-2 times per day).               Example:                         Day 1-2           4-5 minutes     3 times per day                         Day 7-8           10-12 minutes 2-3 times per day                         Day 13-14       20-22 minutes 1-2 times per day  GOALS: Goals reviewed with patient? Yes  LONG TERM GOALS (at re-cert): Target date: 07/20/2023  Pt will be independent and compliant with BLE strengthening and balance focused HEP in order to maintain functional progress and improve mobility. Baseline: Compliant w/ supervision (9/24); compliant and IND per caregiver (11/8) Goal status: MET  2.  Patient will be compliant to formal walking program >/= 4 days per week w/ supervision from family to improve aerobic tolerance and ambulatory mechanics w/ appropriate AD. Baseline: Patient has not been compliant recently due to caregiver illness (9/24); at least 5 per caregiver (11/8) Goal status: MET  3.  PT to further  assess rollator use outdoors over unlevel surfaces w/ order request placed as appropriate. Baseline: Order placed (11/5) Goal status: MET  4.  Pt will demonstrate TUG of </=13 seconds in order to decrease risk of falls and improve functional mobility using LRAD. Baseline: 28.53 seconds w/ SBQC (7/30); 17.15 seconds w/ SPC (9/24); 14.29 sec w/ SPC SBA (11/8) Goal status: PARTIALLY MET  5.  Pt will demonstrate a gait speed of >/=2.27 feet/sec in order to decrease risk for falls. Baseline: 0.55 m/sec OR 1.82 ft/sec (7/30); 2.07 ft/sec (9/24); 2.81 ft/sec w/ SPC SBA (11/8) Goal status: MET  6.  Pt will increase BERG balance score to >/=49/56 to demonstrate improved static balance. Baseline: 32/56 (7/30);  44/56 (9/24); 45/56 (11/8) Goal status: NOT MET  LONG TERM GOALS (at re-cert): Target date: 08/31/2023  1.  Pt will demonstrate TUG of </=13 seconds in order to decrease risk of falls and improve functional mobility using LRAD. Baseline: 14.29 sec w/ SPC SBA - average (11/8) Goal status: ONGOING  2.  Pt will demonstrate a gait speed of >/=3.01 feet/sec in order to decrease risk for falls. Baseline: 2.81 ft/sec w/ SPC SBA (11/8) Goal status: REVISED  3.  Pt will increase BERG balance score to >/=49/56 to demonstrate improved static balance. Baseline: 45/56 (11/8) Goal status: ONGOING  ASSESSMENT:  CLINICAL IMPRESSION: Focus of skilled session today on addressing SLS and ankle strategy.  Pt challenged by tasks and corrections involving anterior weight shift.  He continues to benefit from skilled PT to work on appropriate AD use, fall prevention and various balance strategies.  Will continue per POC.  OBJECTIVE IMPAIRMENTS: Abnormal gait, decreased activity tolerance, decreased balance, decreased cognition, difficulty walking, impaired sensation, improper body mechanics, and postural dysfunction.   ACTIVITY LIMITATIONS: carrying, lifting, bending, standing, squatting, stairs, transfers,  reach over head, and locomotion level  PARTICIPATION LIMITATIONS: meal prep, cleaning, laundry, driving, shopping, and community activity  PERSONAL FACTORS: Age, Fitness, Past/current experiences, Time since onset  of injury/illness/exacerbation, and 1-2 comorbidities: dementia, neuropathy  are also affecting patient's functional outcome.   REHAB POTENTIAL: [place] PMH and personal factors  CLINICAL DECISION MAKING: Evolving/moderate complexity  EVALUATION COMPLEXITY: Moderate  PLAN:  PT FREQUENCY: 1x/week  PT DURATION: 8 weeks + 4 weeks (at re-cert) + 4 weeks (at re-cert 29/01/2840)  PLANNED INTERVENTIONS: Therapeutic exercises, Therapeutic activity, Neuromuscular re-education, Balance training, Gait training, Patient/Family education, Self Care, Stair training, Vestibular training, DME instructions, and Re-evaluation  PLAN FOR NEXT SESSION:  Add to HEP for bilateral LE strength and balance, bending, and turning/direction changing.  Review STS on incline/standing on incline - ball toss/balloon tap.  Update on rollator?  Sadie Haber, PT, DPT 08/27/2023, 10:49 AM

## 2023-08-29 DIAGNOSIS — H90A21 Sensorineural hearing loss, unilateral, right ear, with restricted hearing on the contralateral side: Secondary | ICD-10-CM | POA: Diagnosis not present

## 2023-08-29 DIAGNOSIS — F0390 Unspecified dementia without behavioral disturbance: Secondary | ICD-10-CM | POA: Diagnosis not present

## 2023-08-29 DIAGNOSIS — H8101 Meniere's disease, right ear: Secondary | ICD-10-CM | POA: Diagnosis not present

## 2023-09-03 ENCOUNTER — Ambulatory Visit: Payer: PPO | Admitting: Physical Therapy

## 2023-09-03 ENCOUNTER — Encounter: Payer: Self-pay | Admitting: Physical Therapy

## 2023-09-03 DIAGNOSIS — Z9181 History of falling: Secondary | ICD-10-CM | POA: Diagnosis not present

## 2023-09-03 DIAGNOSIS — M6281 Muscle weakness (generalized): Secondary | ICD-10-CM

## 2023-09-03 DIAGNOSIS — R2689 Other abnormalities of gait and mobility: Secondary | ICD-10-CM

## 2023-09-03 DIAGNOSIS — R2681 Unsteadiness on feet: Secondary | ICD-10-CM

## 2023-09-03 NOTE — Therapy (Signed)
OUTPATIENT PHYSICAL THERAPY NEURO TREATMENT   Patient Name: Cameron King MRN: [id number] DOB:18-Dec-1945, 77 y.o., male Today's Date: 09/03/2023   PCP: Malva Limes, MD REFERRING PROVIDER: Elinor Parkinson, [place]  END OF SESSION:   PT End of Session - 09/03/23 0938     Visit Number 16    Number of Visits 17   13 + 4   Date for PT Re-Evaluation 09/07/23   pushed out due to scheduling delay   Authorization Type [employer]    PT Start Time 0932    PT Stop Time 1017    PT Time Calculation (min) 45 min    Equipment Utilized During Treatment Gait belt    Activity Tolerance Patient tolerated treatment well    Behavior During Therapy WFL for tasks assessed/performed            Past Medical History:  Diagnosis Date   Depression    High cholesterol    Hypertension    Meniere disease    Ocular Migraines    Shingles 07/16/2015   of eye    Past Surgical History:  Procedure Laterality Date   COLONOSCOPY     COLONOSCOPY WITH PROPOFOL N/A 10/29/2018   Procedure: COLONOSCOPY WITH PROPOFOL;  Surgeon: Midge Minium, MD;  Location: [facility] ENDOSCOPY;  Service: Endoscopy;  Laterality: N/A;   SIGMOIDOSCOPY  2008   Dr. Evette Cristal. Normal per patient report   Patient Active Problem List   Diagnosis Date Noted   Anxiety, mild 10/04/2021   Difficulty walking 10/04/2021   Hearing deficit, bilateral 10/04/2021   Numbness and tingling 10/04/2021   Sensory ataxia 10/04/2021   Numbness and tingling of both feet 02/08/2021   GAD (generalized anxiety disorder) 02/04/2020   Confusion 02/04/2020   History of colonic polyps    Chronic pain of left ankle 10/07/2018   Bilateral hearing loss 08/21/2018   Cerebral ventriculomegaly 08/21/2018   Chronic pain of right knee 10/02/2016   Enthesopathy of hip 08/02/2009   Rotator cuff syndrome 10/04/2008   Chronic infection of sinus 03/30/2008   HLD (hyperlipidemia) 04/09/2007   Former smoker, stopped smoking in distant past 11/23/2005    Allergic rhinitis 08/23/2005   Depressive disorder 08/23/2005   Impotence of organic origin 08/23/2005   Migraine without status migrainosus 08/23/2005    ONSET DATE: a couple years ago  REFERRING DIAG: R20.0,R20.2 (ICD-10-CM) - Numbness and tingling of both feet R26.9 (ICD-10-CM) - Abnormality of gait R26.89 (ICD-10-CM) - Balance problem Z91.81 (ICD-10-CM) - At high risk for falls  THERAPY DIAG:  History of falling  Unsteadiness on feet  Muscle weakness (generalized)  Other abnormalities of gait and mobility  Rationale for Evaluation and Treatment: Rehabilitation  SUBJECTIVE:  SUBJECTIVE STATEMENT: He ambulates in with [facility] today.  He denies falls or acute changes. Pt accompanied by: self and family member-daughter-in-law [name]  PERTINENT HISTORY: Depression, cerebral ventriculomegaly, HLD, dementia, bilateral hearing loss, GAD, neuropathy  PAIN:  Are you having pain? Yes: NPRS scale: 2/10 Pain location: neck Pain description: sore Aggravating factors: looking around Relieving factors: sitting still    PRECAUTIONS: Fall  RED FLAGS: None - pt has urge incontinence  WEIGHT BEARING RESTRICTIONS: No  FALLS: Has patient fallen in last 6 months? Yes. 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  Daughter-in-law endorses move to her home is long-term.  OBJECTIVE:   DIAGNOSTIC FINDINGS: No recent relevant  imaging.  COGNITION: Overall cognitive status: History of cognitive impairments - at baseline-pt has STM deficits, increased processing time, and delayed recall related to his dementia   SENSATION: Light touch: WFL and N/T from ankle line down into toes  COORDINATION: LE RAMS:  WFL Heel-to-shin:  WFL bilaterally  EDEMA:  None noted in BLE  MUSCLE TONE: None noted in BLE, no clonus present bilaterally  POSTURE: forward head and weight shift right  LOWER EXTREMITY ROM:     Active  Right Eval Left Eval  Hip flexion Grossly WFL  Hip extension   Hip abduction   Hip adduction   Hip internal rotation   Hip external rotation   Knee flexion   Knee extension   Ankle dorsiflexion   Ankle plantarflexion   Ankle inversion    Ankle eversion     (Blank rows = not tested)  LOWER EXTREMITY MMT:    MMT Right Eval Left Eval  Hip flexion    Hip extension    Hip abduction    Hip adduction    Hip internal rotation    Hip external rotation    Knee flexion    Knee extension    Ankle dorsiflexion    Ankle plantarflexion    Ankle inversion    Ankle eversion    (Blank rows = not tested)  BED MOBILITY:  Sit to supine Complete Independence Supine to sit Complete Independence Rolling to Right Complete Independence Rolling to Left Complete Independence  TRANSFERS: Assistive device utilized: Quad cane small base  Sit to stand: SBA and CGA Stand to sit: SBA and CGA Chair to chair: SBA and CGA Floor:  varies based on patient cognition and positional preference (pt prefers posterior bump vs quadruped crawl)  GAIT: Gait pattern: step to pattern, step through pattern, decreased arm swing- Right, decreased arm swing- Left, decreased stride length, shuffling, and narrow BOS Distance walked: various clinic distances Assistive device utilized: Quad cane small base Level of assistance: SBA and CGA Comments: Patient demonstrates progressive decrease in step size, but not like that of  a festination pattern.  He tends to carry the cane vs using it creating trip hazard.  PT attempted to cue patient for gait pattern at end of session w/ inconsistent response to intervention.  Did discuss with patient and daughter the cognitive load of using a cane and sometimes creating a fall risk due to these difficulties, but did not discourage trying to utilize  safe gait pattern with NBQC option at home until further assessment and gait training can be completed.  FUNCTIONAL TESTS:  5 times sit to stand: 21.90 seconds w/ BUE support, pt requires repeat simple commands Timed up and go (TUG): To be assessed. 10 meter walk test: To be assessed. Berg Balance Scale: To be assessed.  PATIENT SURVEYS:  None completed due to cognitive demand.  TODAY'S TREATMENT:                                                                                                                              DATE: 09/03/2023 [employer] BLE only progressing to level 4.0 for strengthening; cued for comfortable speed and large amplitude movements. STS on wedge, pt having severe difficulty with task today, he is less willing to rely on UE to support him into standing this visit resulting in repeated collapse to mat due to difficulty engaging LE.  Discussion of safety vs "cheating" when using UE to assist in transfers especially on days like today when movement is a bit more challenging. Standing placing cones in semi-circle and correcting placement for visuospatial awareness, safety with object retrieval, and forward bending.  Cues for approximation to object, breathing during task, and using landmarks on floor to find appropriate pattern. Standing on decline performing ball toss > ball bounce SBA  PATIENT EDUCATION: Education details:  Continue walking program and HEP.  Encouraged caregiver to call [employer] about rollator and PT will follow-up as well!  Likely re-cert at next visit. Person educated: Patient and Caregiver  daughter-in-law Arline Asp Education method: Explanation Education comprehension: verbalized understanding and needs further education  HOME EXERCISE PROGRAM: Access Code: [email or web address] URL: [email or web address] Date: 04/30/2023 Prepared by: Camille Bal  Exercises - Sit to Stand with Armchair  - 1 x daily - 7 x weekly - 2 sets - 6 reps - Standing March with Counter Support  - 1 x daily - 7 x weekly - 2 sets - 20 reps - Standing with Head Rotation  - 1 x daily - 7 x weekly - 3 sets - 10 reps - Feet Together Balance at [employer] Eyes Open  - 1 x daily - 7 x weekly - 1 sets - 3 reps - 20-30 seconds hold - Corner Balance Feet Together With Eyes Closed  - 1 x daily - 7 x weekly - 1 sets - 3 reps - 15-20 seconds hold - Standing Tandem Balance with Counter Support  - 1 x daily - 7 x weekly - 1 sets - 4 reps - 30 seconds hold  Provided fall prevention handout 04/30/2023.  You Can Walk For A Certain Length Of Time Each Day (with family and use AD for safe endurance)                          Walk 4 minutes 3 times per day.  Increase 1-2  minutes every week.             Work up to 15 minutes (1-2 times per day).               Example:                         Day 1-2           4-5 minutes     3 times per day                         Day 7-8           10-12 minutes 2-3 times per day                         Day 13-14       20-22 minutes 1-2 times per day  GOALS: Goals reviewed with patient? Yes  LONG TERM GOALS (at re-cert): Target date: 07/20/2023  Pt will be independent and compliant with BLE strengthening and balance focused HEP in order to maintain functional progress and improve mobility. Baseline: Compliant w/ supervision (9/24); compliant and IND per caregiver (11/8) Goal status: MET  2.  Patient will be compliant to formal walking program >/= 4 days per week w/ supervision from family to improve aerobic tolerance and ambulatory mechanics w/ appropriate  AD. Baseline: Patient has not been compliant recently due to caregiver illness (9/24); at least 5 per caregiver (11/8) Goal status: MET  3.  PT to further assess rollator use outdoors over unlevel surfaces w/ order request placed as appropriate. Baseline: Order placed (11/5) Goal status: MET  4.  Pt will demonstrate TUG of </=13 seconds in order to decrease risk of falls and improve functional mobility using LRAD. Baseline: 28.53 seconds w/ SBQC (7/30); 17.15 seconds w/ SPC (9/24); 14.29 sec w/ SPC SBA (11/8) Goal status: PARTIALLY MET  5.  Pt will demonstrate a gait speed of >/=2.27 feet/sec in order to decrease risk for falls. Baseline: 0.55 m/sec OR 1.82 ft/sec (7/30); 2.07 ft/sec (9/24); 2.81 ft/sec w/ SPC SBA (11/8) Goal status: MET  6.  Pt will increase BERG balance score to >/=49/56 to demonstrate improved static balance. Baseline: 32/56 (7/30);  44/56 (9/24); 45/56 (11/8) Goal status: NOT MET  LONG TERM GOALS (at re-cert): Target date: 08/31/2023  1.  Pt will demonstrate TUG of </=13 seconds in order to decrease risk of falls and improve functional mobility using LRAD. Baseline: 14.29 sec w/ SPC SBA - average (11/8) Goal status: ONGOING  2.  Pt will demonstrate a gait speed of >/=3.01 feet/sec in order to decrease risk for falls. Baseline: 2.81 ft/sec w/ SPC SBA (11/8) Goal status: REVISED  3.  Pt will increase BERG balance score to >/=49/56 to demonstrate improved static balance. Baseline: 45/56 (11/8) Goal status: ONGOING  ASSESSMENT:  CLINICAL IMPRESSION: Patient having increased processing difficulty this visit resulting in safety challenges throughout session mostly in regards to standing w/ proper glut engagement.  He required increased time to initiate activity, repetition and simplified cues to progress in session.  He was very challenged by STS on incline vs prior visit.  His balance on decline appeared more steady today.  Will continue to address static and  dynamic balance and will adjust rollator for patient as received.  PT to follow-up on this as well.  Will continue per POC.  OBJECTIVE IMPAIRMENTS: Abnormal gait, decreased activity tolerance, decreased balance, decreased cognition, difficulty walking, impaired sensation, improper body mechanics, and postural dysfunction.   ACTIVITY LIMITATIONS: carrying, lifting, bending, standing, squatting, stairs, transfers, reach over head, and locomotion level  PARTICIPATION LIMITATIONS: meal prep, cleaning, laundry, driving, shopping, and community activity  PERSONAL FACTORS: Age, Fitness, Past/current experiences, Time since onset of injury/illness/exacerbation, and 1-2 comorbidities: dementia, neuropathy  are also affecting patient's functional outcome.   REHAB POTENTIAL: [place] PMH and personal factors  CLINICAL DECISION MAKING: Evolving/moderate complexity  EVALUATION COMPLEXITY: Moderate  PLAN:  PT FREQUENCY: 1x/week  PT DURATION: 8 weeks + 4 weeks (at re-cert) + 4 weeks (at re-cert 16/09/958)  PLANNED INTERVENTIONS: Therapeutic exercises, Therapeutic activity, Neuromuscular re-education, Balance training, Gait training, Patient/Family education, Self Care, Stair training, Vestibular training, DME instructions, and Re-evaluation  PLAN FOR NEXT SESSION:  Add to HEP for bilateral LE strength and balance, bending, and turning/direction changing.  Review STS on incline.  Update on rollator?  ASSESS LTGs- re-cert?  Sadie Haber, PT, DPT 09/03/2023, 10:22 AM

## 2023-09-07 ENCOUNTER — Telehealth: Payer: Self-pay

## 2023-09-07 NOTE — Telephone Encounter (Signed)
Copied from CRM [id number]. Topic: General - Inquiry >> Sep 07, 2023 12:35 PM Shon Hale wrote: Reason for CRM: Gerarda Gunther stating order for rollator walker was received. To process order they are needing office visit notes that support need for walker. Requesting notes to be faxed to [phone].

## 2023-09-08 NOTE — Telephone Encounter (Signed)
He needs o.v. to evaluate and document medical necessity for insurance to cover

## 2023-09-10 NOTE — Telephone Encounter (Signed)
LMTCB-appointment needed

## 2023-09-12 ENCOUNTER — Ambulatory Visit: Payer: PPO | Admitting: Physical Therapy

## 2023-09-12 ENCOUNTER — Ambulatory Visit: Payer: Self-pay

## 2023-09-12 ENCOUNTER — Encounter: Payer: Self-pay | Admitting: Physical Therapy

## 2023-09-12 DIAGNOSIS — R200 Anesthesia of skin: Secondary | ICD-10-CM | POA: Diagnosis not present

## 2023-09-12 DIAGNOSIS — Z9181 History of falling: Secondary | ICD-10-CM | POA: Diagnosis not present

## 2023-09-12 DIAGNOSIS — R2689 Other abnormalities of gait and mobility: Secondary | ICD-10-CM

## 2023-09-12 DIAGNOSIS — R42 Dizziness and giddiness: Secondary | ICD-10-CM | POA: Diagnosis not present

## 2023-09-12 DIAGNOSIS — R2681 Unsteadiness on feet: Secondary | ICD-10-CM

## 2023-09-12 DIAGNOSIS — R262 Difficulty in walking, not elsewhere classified: Secondary | ICD-10-CM | POA: Diagnosis not present

## 2023-09-12 DIAGNOSIS — M6281 Muscle weakness (generalized): Secondary | ICD-10-CM

## 2023-09-12 DIAGNOSIS — R296 Repeated falls: Secondary | ICD-10-CM | POA: Diagnosis not present

## 2023-09-12 NOTE — Telephone Encounter (Signed)
Chief Complaint: Urinary symptoms  Symptoms: urinary incontinence, increased confusion, gait is off or unsteady Frequency: comes and goes  Pertinent Negatives: Patient denies fever, pain with urination, blood in urine Disposition: [] ED /[] Urgent Care (no appt availability in office) / [x] Appointment(In office/virtual)/ []  [facility]/ [] Home Care/ [] Refused Recommended Disposition /[] [facility]/ []  Follow-up with PCP Additional Notes: Patient daughter Arline Asp states the patient has been incontinent at night for 2 days and that is a new onset for the patient. Patient is also displaying increased confusion, and unsteady gait. Daughter states she is not sure if the symptoms are related to a UTI or something else. Care advice was given and patient has been scheduled for an appointment in office on Friday. [name] request that PCP is made aware of the new incontinence the patient is experiencing. Reason for Disposition  [1] Can't control passage of urine (i.e., urinary incontinence) AND [2] new-onset (< 2 weeks) or worsening  Answer Assessment - Initial Assessment Questions 1. SYMPTOM: "What's the main symptom you're concerned about?" (e.g., frequency, incontinence)     Incontinence  2. ONSET: "When did the  incontinence  start?"     2 nights ago  3. PAIN: "Is there any pain?" If Yes, ask: "How bad is it?" (Scale: 1-10; mild, moderate, severe)     No  4. CAUSE: "What do you think is causing the symptoms?"     UTI 5. OTHER SYMPTOMS: "Do you have any other symptoms?" (e.g., blood in urine, fever, flank pain, pain with urination)     Increased confusion, gait is off  Protocols used: Urinary Symptoms-A-AH

## 2023-09-12 NOTE — Therapy (Signed)
OUTPATIENT PHYSICAL THERAPY NEURO TREATMENT - RE-CERT   Patient Name: Cameron King MRN: [id number] DOB:1946/08/18, 77 y.o., male Today's Date: 09/12/2023   PCP: Malva Limes, MD REFERRING PROVIDER: Elinor Parkinson, DPM  END OF SESSION:   09/12/23 1105  PT Visits / Re-Eval  Visit Number 17  Number of Visits 21 (17 + 4 (only scheduling 2 of 4 visits due to holidays))  Date for PT Re-Evaluation 10/19/23 (pushed out due to scheduling delay)  Authorization  Authorization Type [employer]  PT Time Calculation  PT Start Time 1101  PT Stop Time 1150  PT Time Calculation (min) 49 min  PT - End of Session  Equipment Utilized During Treatment Gait belt  Activity Tolerance Patient tolerated treatment well  Behavior During Therapy WFL for tasks assessed/performed   Past Medical History:  Diagnosis Date   Depression    High cholesterol    Hypertension    Meniere disease    Ocular Migraines    Shingles 07/16/2015   of eye    Past Surgical History:  Procedure Laterality Date   COLONOSCOPY     COLONOSCOPY WITH PROPOFOL N/A 10/29/2018   Procedure: COLONOSCOPY WITH PROPOFOL;  Surgeon: Midge Minium, MD;  Location: [facility] ENDOSCOPY;  Service: Endoscopy;  Laterality: N/A;   SIGMOIDOSCOPY  2008   Dr. Evette Cristal. Normal per patient report   Patient Active Problem List   Diagnosis Date Noted   Anxiety, mild 10/04/2021   Difficulty walking 10/04/2021   Hearing deficit, bilateral 10/04/2021   Numbness and tingling 10/04/2021   Sensory ataxia 10/04/2021   Numbness and tingling of both feet 02/08/2021   GAD (generalized anxiety disorder) 02/04/2020   Confusion 02/04/2020   History of colonic polyps    Chronic pain of left ankle 10/07/2018   Bilateral hearing loss 08/21/2018   Cerebral ventriculomegaly 08/21/2018   Chronic pain of right knee 10/02/2016   Enthesopathy of hip 08/02/2009   Rotator cuff syndrome 10/04/2008   Chronic infection of sinus 03/30/2008   HLD  (hyperlipidemia) 04/09/2007   Former smoker, stopped smoking in distant past 11/23/2005   Allergic rhinitis 08/23/2005   Depressive disorder 08/23/2005   Impotence of organic origin 08/23/2005   Migraine without status migrainosus 08/23/2005    ONSET DATE: a couple years ago  REFERRING DIAG: R20.0,R20.2 (ICD-10-CM) - Numbness and tingling of both feet R26.9 (ICD-10-CM) - Abnormality of gait R26.89 (ICD-10-CM) - Balance problem Z91.81 (ICD-10-CM) - At high risk for falls  THERAPY DIAG:  History of falling  Unsteadiness on feet  Muscle weakness (generalized)  Other abnormalities of gait and mobility  Rationale for Evaluation and Treatment: Rehabilitation  SUBJECTIVE:  SUBJECTIVE STATEMENT: He ambulates in with [facility] today.  He denies falls or acute changes.  Daughter-in-law is picking up rollator today.  He is okay with re-cert today to adjust rollator after he receives it and to practice some more. Pt accompanied by: self and family member-daughter-in-law [name]  PERTINENT HISTORY: Depression, cerebral ventriculomegaly, HLD, dementia, bilateral hearing loss, GAD, neuropathy  PAIN:  Are you having pain? Yes: NPRS scale: 2-3/10 Pain location: all over Pain description: sore Aggravating factors: not sure Relieving factors: not sure    PRECAUTIONS: Fall  RED FLAGS: None - pt has urge incontinence  WEIGHT BEARING RESTRICTIONS: No  FALLS: Has patient fallen in last 6 months? Yes. 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  Daughter-in-law endorses move to her home is long-term.  OBJECTIVE:   DIAGNOSTIC FINDINGS: No recent relevant imaging.  COGNITION: Overall cognitive status: History of cognitive impairments - at baseline-pt has STM deficits, increased processing time, and delayed recall related to his dementia   SENSATION: Light touch: WFL and N/T from ankle line down into toes  COORDINATION: LE RAMS:  WFL Heel-to-shin:  WFL bilaterally  EDEMA:  None noted in BLE  MUSCLE TONE: None noted in BLE, no clonus present bilaterally  POSTURE: forward head and weight shift right  LOWER EXTREMITY ROM:     Active  Right Eval Left Eval  Hip flexion Grossly WFL  Hip extension   Hip abduction   Hip adduction   Hip internal rotation   Hip external rotation   Knee flexion   Knee extension   Ankle dorsiflexion   Ankle plantarflexion   Ankle inversion    Ankle eversion     (Blank rows = not tested)  LOWER EXTREMITY MMT:    MMT Right Eval Left Eval  Hip flexion    Hip extension    Hip abduction    Hip adduction    Hip internal rotation    Hip external rotation    Knee flexion    Knee extension    Ankle dorsiflexion    Ankle plantarflexion    Ankle inversion    Ankle eversion    (Blank rows = not tested)  BED MOBILITY:  Sit to supine Complete Independence Supine to sit Complete Independence Rolling to Right Complete Independence Rolling to Left Complete Independence  TRANSFERS: Assistive device utilized: Quad cane small base  Sit to stand: SBA and CGA Stand to sit: SBA and CGA Chair to chair: SBA and CGA Floor:  varies based on patient cognition and positional preference (pt prefers posterior bump vs quadruped crawl)  GAIT: Gait pattern: step to pattern, step through pattern, decreased arm swing- Right, decreased arm swing- Left, decreased stride length,  shuffling, and narrow BOS Distance walked: various clinic distances Assistive device utilized: Quad cane small base Level of assistance: SBA and CGA Comments: Patient demonstrates progressive decrease in step size, but not like that of a festination pattern.  He tends to carry the cane vs using it creating trip hazard.  PT attempted to cue patient for gait pattern at end of session w/ inconsistent response to intervention.  Did discuss with  patient and daughter the cognitive load of using a cane and sometimes creating a fall risk due to these difficulties, but did not discourage trying to utilize safe gait pattern with NBQC option at home until further assessment and gait training can be completed.  FUNCTIONAL TESTS:  5 times sit to stand: 21.90 seconds w/ BUE support, pt requires repeat simple commands Timed up and go (TUG): To be assessed. 10 meter walk test: To be assessed. Berg Balance Scale: To be assessed.  PATIENT SURVEYS:  None completed due to cognitive demand.  TODAY'S TREATMENT:                                                                                                                              DATE: 09/12/2023 SciFit BLE (intermittent BUE used as needed w/ fatigue) at level 4.0 in hill mode for strengthening and large amplitude movement, frequent cuing to maintain large movements. Discussed [name] reaching out to PCP regarding pt change in status over recent week - recent myalgia, few more episodes of incontinence, more confusion, withdrawal from social engagement and physical activity with family, and worsening of walking and use of cane.  Discussed her concern for UTI vs pt lacking routine creating some confusion/fatigue due to holiday engagements of late. BERG:  [facility] PT Assessment - 09/12/23 1125       Berg Balance Test   Sit to Stand Able to stand without using hands and stabilize independently    Standing Unsupported Able to stand safely 2 minutes    Sitting with Back  Unsupported but Feet Supported on Floor or Stool Able to sit safely and securely 2 minutes    Stand to Sit Sits safely with minimal use of hands    Transfers Able to transfer safely, minor use of hands    Standing Unsupported with Eyes Closed Able to stand 10 seconds safely    Standing Unsupported with Feet Together Able to place feet together independently and stand for 1 minute with supervision    From Standing, Reach Forward with Outstretched Arm Can reach forward >12 cm safely (5")    From Standing Position, Pick up Object from Floor Able to pick up shoe safely and easily    From Standing Position, Turn to Look Behind Over each Shoulder Looks behind one side only/other side shows less weight shift   Pt straining neck to right reporting tightness and pain   Turn 360 Degrees Able to turn 360 degrees safely one side only in 4 seconds or less   turns to right in 4 seconds   Standing Unsupported, Alternately Place Feet on Step/Stool Able to complete 4 steps without aid or supervision    Standing Unsupported, One Foot in Front Able to take small step independently and hold 30 seconds    Standing on One Leg Tries to lift leg/unable to hold 3 seconds but remains standing independently    Total Score 45    Berg comment:  45/56            TUG: 17.10 sec w/ SPC :  10.16 sec - pt carries but does not use cane = 0.66m/sec OR 3.25 ft/sec  PATIENT EDUCATION: Education details:  Continue walking program and HEP.  Discussed re-cert for 2 visits today and progress towards goals. Person educated: Patient and Caregiver daughter-in-law Arline Asp Education method: Explanation Education comprehension: verbalized understanding and needs further education  HOME EXERCISE PROGRAM: Access Code: [email or web address] URL: [email or web address] Date: 04/30/2023 Prepared by: Camille Bal  Exercises - Sit to Stand with Armchair  - 1 x daily - 7 x weekly - 2 sets - 6 reps - Standing March with Counter  Support  - 1 x daily - 7 x weekly - 2 sets - 20 reps - Standing with Head Rotation  - 1 x daily - 7 x weekly - 3 sets - 10 reps - Feet Together Balance at [employer] Eyes Open  - 1 x daily - 7 x weekly - 1 sets - 3 reps - 20-30 seconds hold - Corner Balance Feet Together With Eyes Closed  - 1 x daily - 7 x weekly - 1 sets - 3 reps - 15-20 seconds hold - Standing Tandem Balance with Counter Support  - 1 x daily - 7 x weekly - 1 sets - 4 reps - 30 seconds hold  Provided fall prevention handout 04/30/2023.  You Can Walk For A Certain Length Of Time Each Day (with family and use AD for safe endurance)                          Walk 4 minutes 3 times per day.             Increase 1-2  minutes every week.             Work up to 15 minutes (1-2 times per day).               Example:                         Day 1-2           4-5 minutes     3 times per day                         Day 7-8           10-12 minutes 2-3 times per day                         Day 13-14       20-22 minutes 1-2 times per day  GOALS: Goals reviewed with patient? Yes  LONG TERM GOALS (at re-cert): Target date: 07/20/2023  Pt will be independent and compliant with BLE strengthening and balance focused HEP in order to maintain functional progress and improve mobility. Baseline: Compliant w/ supervision (9/24); compliant and IND per caregiver (11/8) Goal status: MET  2.  Patient will be compliant to formal walking program >/= 4 days per week w/ supervision from family to improve aerobic tolerance and ambulatory mechanics w/ appropriate AD. Baseline: Patient has not been compliant recently due to caregiver illness (9/24); at least 5 per caregiver (11/8) Goal status: MET  3.  PT to further assess rollator use outdoors over unlevel surfaces w/ order request placed as appropriate. Baseline: Order placed (11/5)  Goal status: MET  4.  Pt will demonstrate TUG of </=13 seconds in order to decrease risk of falls and improve  functional mobility using LRAD. Baseline: 28.53 seconds w/ SBQC (7/30); 17.15 seconds w/ SPC (9/24); 14.29 sec w/ SPC SBA (11/8) Goal status: PARTIALLY MET  5.  Pt will demonstrate a gait speed of >/=2.27 feet/sec in order to decrease risk for falls. Baseline: 0.55 m/sec OR 1.82 ft/sec (7/30); 2.07 ft/sec (9/24); 2.81 ft/sec w/ SPC SBA (11/8) Goal status: MET  6.  Pt will increase BERG balance score to >/=49/56 to demonstrate improved static balance. Baseline: 32/56 (7/30);  44/56 (9/24); 45/56 (11/8) Goal status: NOT MET  LONG TERM GOALS (at re-cert): Target date: 08/31/2023  1.  Pt will demonstrate TUG of </=13 seconds in order to decrease risk of falls and improve functional mobility using LRAD. Baseline: 14.29 sec w/ SPC SBA - average (11/8); 17.10 sec w/ SPC (12/18) Goal status: ONGOING  2.  Pt will demonstrate a gait speed of >/=3.01 feet/sec in order to decrease risk for falls. Baseline: 2.81 ft/sec w/ SPC SBA (11/8); 3.25 ft/sec w/ SPC (not well used) (12/18) Goal status: MET  3.  Pt will increase BERG balance score to >/=49/56 to demonstrate improved static balance. Baseline: 45/56 (11/8); 45/56 (12/18) Goal status: NOT MET  LONG TERM GOALS (at re-cert): Target date: 10/12/2023  1.  Pt will demonstrate TUG of </=13 seconds in order to decrease risk of falls and improve functional mobility using LRAD. Baseline: 14.29 sec w/ SPC SBA - average (11/8); 17.10 sec w/ SPC (12/18) Goal status: ONGOING  2.  Pt will demonstrate a gait speed of >/=3.45 feet/sec in order to decrease risk for falls. Baseline: 2.81 ft/sec w/ SPC SBA (11/8); 3.25 ft/sec w/ SPC (not well used) (12/18) Goal status: REVISED  3.  Pt will demonstrate safe navigation of personal rollator and features over variable surfaces in order to improve mobility in community environment.  Baseline: to receive rollator this afternoon (12/18)  Goal status:  INITIAL  ASSESSMENT:  CLINICAL IMPRESSION: Assessed LTGs  this visit with patient demonstrating functional ceiling on BERG assessment maintaining same score as prior assessment.  He ambulating at faster pace this visit but with less appropriate cane use.  He is due to pick up his rollator this afternoon for further adjustment and practice at future visits.  He appears to have more confusion and to be a bit more withdrawn this visit with caregiver having concerns for UTI.  Did discuss having him assessed by PCP or other provider if symptoms worsen or do not resolve in next day or so.  Will proceed with re-cert.  OBJECTIVE IMPAIRMENTS: Abnormal gait, decreased activity tolerance, decreased balance, decreased cognition, difficulty walking, impaired sensation, improper body mechanics, and postural dysfunction.   ACTIVITY LIMITATIONS: carrying, lifting, bending, standing, squatting, stairs, transfers, reach over head, and locomotion level  PARTICIPATION LIMITATIONS: meal prep, cleaning, laundry, driving, shopping, and community activity  PERSONAL FACTORS: Age, Fitness, Past/current experiences, Time since onset of injury/illness/exacerbation, and 1-2 comorbidities: dementia, neuropathy  are also affecting patient's functional outcome.   REHAB POTENTIAL: Fair See PMH and personal factors  CLINICAL DECISION MAKING: Evolving/moderate complexity  EVALUATION COMPLEXITY: Moderate  PLAN:  PT FREQUENCY: 1x/week  PT DURATION: 8 weeks + 4 weeks (at re-cert) + 4 weeks (at re-cert 91/12/7827) + 4 weeks (re-cert 56/21 - will only schedule 2 weeks)  PLANNED INTERVENTIONS: Therapeutic exercises, Therapeutic activity, Neuromuscular re-education, Balance training, Gait training, Patient/Family education, Self Care,  Stair training, Vestibular training, DME instructions, and Re-evaluation  PLAN FOR NEXT SESSION:  Adjust and practice rollator  Sadie Haber, PT, DPT 09/12/2023, 11:51 AM

## 2023-09-13 ENCOUNTER — Ambulatory Visit: Payer: Self-pay | Admitting: *Deleted

## 2023-09-13 NOTE — Telephone Encounter (Signed)
Reason for Disposition  [1] Follow-up call to recent contact AND [2] information only call, no triage required  Answer Assessment - Initial Assessment Questions 1. REASON FOR CALL or QUESTION: "What is your reason for calling today?" or "How can I best help you?" or "What question do you have that I can help answer?"     Pt and his daughter, Arline Asp returned the call.   I read them the message from Cherlyn Roberts, FNP dated 09/12/2023 at 4:39 PM.    There is an appt scheduled with Caryl Asp for 09/14/2023 mentioned by [facility] CMA.  Daughter, Arline Asp said he is not that bad at this point.    We would prefer to come in tomorrow for the appt.   I gave her symptoms to go to the urgent care or ED.   Fever, chills, flank pain, blood in urine, confusion.    I recommended the urgent care as the EDs are in surge mode now with the volume of pts waiting to be seen.    Arline Asp was agreeable to this plan and plans on bringing him in for the appt tomorrow.  Protocols used: Information Only Call - No Triage-A-AH

## 2023-09-13 NOTE — Telephone Encounter (Signed)
Patient scheduled for tomorrow

## 2023-09-13 NOTE — Telephone Encounter (Signed)
  Chief Complaint: Arline Asp and pt. Returned call.    I read them the message from Primus Bravo, FNP dated 09/12/2023 at 4:39 PM.   They have decided to wait and come in for the appt on 09/14/2023 with [facility]. Symptoms: N/A Frequency: N/A Pertinent Negatives: Patient denies N/A Disposition: [] ED /[] Urgent Care (no appt availability in office) / [] Appointment(In office/virtual)/ []  [place] [facility]/ [] Home Care/ [] Refused Recommended Disposition /[] [facility]/ [x]  Follow-up with PCP Additional Notes: Message forwarded to Charlcie Cradle, FNP on their decision to wait and come in for the appt on 12/20 with Kellie instead of going on to the urgent care or ED as recommended by [facility].

## 2023-09-14 ENCOUNTER — Ambulatory Visit (INDEPENDENT_AMBULATORY_CARE_PROVIDER_SITE_OTHER): Payer: PPO | Admitting: Family Medicine

## 2023-09-14 ENCOUNTER — Encounter: Payer: Self-pay | Admitting: Family Medicine

## 2023-09-14 VITALS — BP 123/77 | HR 74 | Resp 16 | Ht 67.0 in | Wt 201.0 lb

## 2023-09-14 DIAGNOSIS — F03B0 Unspecified dementia, moderate, without behavioral disturbance, psychotic disturbance, mood disturbance, and anxiety: Secondary | ICD-10-CM | POA: Diagnosis not present

## 2023-09-14 DIAGNOSIS — R32 Unspecified urinary incontinence: Secondary | ICD-10-CM | POA: Diagnosis not present

## 2023-09-14 LAB — POCT URINALYSIS DIPSTICK
Bilirubin, UA: NEGATIVE
Blood, UA: NEGATIVE
Glucose, UA: NEGATIVE
Ketones, UA: NEGATIVE
Nitrite, UA: NEGATIVE
Protein, UA: NEGATIVE
Spec Grav, UA: 1.025 (ref 1.010–1.025)
Urobilinogen, UA: 0.2 U/dL
pH, UA: 6 (ref 5.0–8.0)

## 2023-09-14 NOTE — Addendum Note (Signed)
Addended by: Marjie Skiff on: 09/14/2023 03:29 PM   Modules accepted: Orders

## 2023-09-14 NOTE — Assessment & Plan Note (Signed)
Dementia at baseline, lives with son and daughter in law. Increased ADLs needs. Incontinence may be done to lack of awareness of need to urine.  - Will monitor, may need urology referral - Continue adult briefs.

## 2023-09-14 NOTE — Progress Notes (Signed)
Acute Office Visit  Introduced to nurse practitioner role and practice setting.  All questions answered.  Discussed provider/patient relationship and expectations.   Subjective:     Patient ID: BIBB JIRSA, male    DOB: July 12, 1946, 77 y.o.   MRN: [id number]  Chief Complaint  Patient presents with   Urinary Incontinence    Pt presents with daughter in law (DIL) for three days of incontinence. DIL states he baseline has moderate dementia and confusions seems a little worse and more tired. Pt states no urinary pain, odor, clear color, no discharge he is aware. Denies fevers, shortness of breathe, dizziness, sweating, difficulty breathing, palpitations, chest pain, or back pain.   Pt is poor historical conversation given co-morbidities, but DIL states she feels given dementia she feels the pt's incontinence to due to him forgetting to urinate and it becomes to late. He starting wearing adult briefs to help.     Review of Systems  All other systems reviewed and are negative.       Objective:    BP 123/77 (BP Location: Left Arm, Patient Position: Sitting, Cuff Size: Large)   Pulse 74   Resp 16   Ht 5\' 7"  (1.702 m)   Wt 201 lb (91.2 kg)   BMI 31.48 kg/m    Physical Exam Constitutional:      Appearance: Normal appearance. He is obese.  HENT:     Head: Normocephalic.  Eyes:     Extraocular Movements: Extraocular movements intact.     Pupils: Pupils are equal, round, and reactive to light.  Cardiovascular:     Rate and Rhythm: Normal rate and regular rhythm.     Pulses: Normal pulses.     Heart sounds: Normal heart sounds. No murmur heard.    No gallop.  Pulmonary:     Effort: No respiratory distress.     Breath sounds: No stridor. No wheezing, rhonchi or rales.  Chest:     Chest wall: No tenderness.  Abdominal:     General: Bowel sounds are normal. There is no distension.     Palpations: Abdomen is soft. There is no mass.     Tenderness: There is no abdominal  tenderness. There is no right CVA tenderness, left CVA tenderness, guarding or rebound.     Hernia: No hernia is present.  Genitourinary:    Comments: No suprapubic pain Musculoskeletal:        General: No swelling.     Cervical back: No tenderness.     Right lower leg: No edema.     Left lower leg: No edema.  Lymphadenopathy:     Cervical: No cervical adenopathy.  Skin:    General: Skin is warm and dry.     Capillary Refill: Capillary refill takes less than 2 seconds.  Neurological:     Mental Status: He is alert. He is confused.     GCS: GCS eye subscore is 4. GCS verbal subscore is 4. GCS motor subscore is 6.     Cranial Nerves: Cranial nerves 2-12 are intact.     Sensory: Sensation is intact.     Motor: Motor function is intact.     Comments: Baseline demenita  Psychiatric:        Attention and Perception: He is attentive.        Mood and Affect: Mood normal.        Speech: Speech is delayed.        Behavior: Behavior is slowed. Behavior is  cooperative.        Thought Content: Thought content normal.        Cognition and Memory: Cognition is impaired. Memory is impaired. He exhibits impaired recent memory.        Judgment: Judgment normal.    No results found for any visits on 09/14/23.     Assessment & Plan:   Problem List Items Addressed This Visit       Nervous and Auditory   Moderate dementia (HCC)   Dementia at baseline, lives with son and daughter in law. Increased ADLs needs. Incontinence may be done to lack of awareness of need to urine.  - Will monitor, may need urology referral - Continue adult briefs.        Other   Urinary incontinence - Primary   Concern for increased incontinence over last few days. Vitals stable, pt appears well physically. No CVA tenderness or suprapubic pain. Attempted POC UA - pt was unable to urinate during visit. Incontinence more likely due to dementia and lack of awareness of urge to go. Prefer to get UA and culture to see if  pt has infection. No hx of UTI per daughter in law or chart review.  - Sent pt home with urine specimen cup, instructed if able to urine at home, please bring back to [facility] and we will test POC UA and send culture, in case pt needs ABX.  - If incontinence continues, may need urology refer given pt's dementia for their recommendations.  - continue to wear adult briefs - discussed scheduled toileting and adequate hydration during day, limit intake past 8pm for decrease nocturia - stressed if pt becomes acutely confused, dizzy, febrile, lethargic - please seek higher level of care (urgent or ED) for eval and mgmt        Relevant Orders   POCT Urinalysis Dipstick   Urine Culture    Will communicate labs if pt able to collect urine sample.  No orders of the defined types were placed in this encounter.   Return if symptoms worsen or fail to improve.  I, Sallee Provencal, FNP, have reviewed all documentation for this visit. The documentation on 09/14/23 for the exam, diagnosis, procedures, and orders are all accurate and complete.   Sallee Provencal, FNP

## 2023-09-14 NOTE — Assessment & Plan Note (Addendum)
Concern for increased incontinence over last few days. Vitals stable, pt appears well physically. No CVA tenderness or suprapubic pain. Attempted POC UA - pt was unable to urinate during visit. Incontinence more likely due to dementia and lack of awareness of urge to go. Prefer to get UA and culture to see if pt has infection. No hx of UTI per daughter in law or chart review.  - Sent pt home with urine specimen cup, instructed if able to urine at home, please bring back to [facility] and we will test POC UA and send culture, in case pt needs ABX.  - If incontinence continues, may need urology refer given pt's dementia for their recommendations.  - continue to wear adult briefs - discussed scheduled toileting and adequate hydration during day, limit intake past 8pm for decrease nocturia - stressed if pt becomes acutely confused, dizzy, febrile, lethargic - please seek higher level of care (urgent or ED) for eval and mgmt

## 2023-09-16 LAB — URINE CULTURE: Organism ID, Bacteria: NO GROWTH

## 2023-09-16 LAB — SPECIMEN STATUS REPORT

## 2023-09-17 ENCOUNTER — Encounter: Payer: Self-pay | Admitting: Family Medicine

## 2023-09-17 NOTE — Progress Notes (Signed)
Negative Urine Culture and scant leukocytes

## 2023-09-17 NOTE — Telephone Encounter (Signed)
[id number] for [facility] Nurse to schedule patient, regarding Rollator

## 2023-09-24 ENCOUNTER — Ambulatory Visit: Payer: PPO | Admitting: Physical Therapy

## 2023-09-24 ENCOUNTER — Encounter: Payer: Self-pay | Admitting: Physical Therapy

## 2023-09-24 DIAGNOSIS — R2689 Other abnormalities of gait and mobility: Secondary | ICD-10-CM

## 2023-09-24 DIAGNOSIS — M6281 Muscle weakness (generalized): Secondary | ICD-10-CM

## 2023-09-24 DIAGNOSIS — Z9181 History of falling: Secondary | ICD-10-CM | POA: Diagnosis not present

## 2023-09-24 DIAGNOSIS — R2681 Unsteadiness on feet: Secondary | ICD-10-CM

## 2023-09-24 NOTE — Telephone Encounter (Signed)
Appointment is scheduled.

## 2023-09-24 NOTE — Therapy (Signed)
OUTPATIENT PHYSICAL THERAPY NEURO TREATMENT   Patient Name: Cameron King MRN: [id number] DOB:02-Mar-1946, 77 y.o., male Today's Date: 09/24/2023   PCP: Malva Limes, MD REFERRING PROVIDER: Elinor Parkinson, [place]  END OF SESSION:   PT End of Session - 09/24/23 1322     Visit Number 18    Number of Visits 21   17 + 4 (only scheduling 2 of 4 visits due to holidays)   Date for PT Re-Evaluation 10/19/23   pushed out due to scheduling delay   Authorization Type [employer]    PT Start Time 1315    PT Stop Time 1400    PT Time Calculation (min) 45 min    Equipment Utilized During Treatment Gait belt    Activity Tolerance Patient tolerated treatment well    Behavior During Therapy [facility] for tasks assessed/performed              Past Medical History:  Diagnosis Date   Depression    High cholesterol    Hypertension    Meniere disease    Ocular Migraines    Shingles 07/16/2015   of eye    Past Surgical History:  Procedure Laterality Date   COLONOSCOPY     COLONOSCOPY WITH PROPOFOL N/A 10/29/2018   Procedure: COLONOSCOPY WITH PROPOFOL;  Surgeon: Midge Minium, MD;  Location: [facility] ENDOSCOPY;  Service: Endoscopy;  Laterality: N/A;   SIGMOIDOSCOPY  2008   Dr. Evette Cristal. Normal per patient report   Patient Active Problem List   Diagnosis Date Noted   Urinary incontinence 09/14/2023   Moderate dementia (HCC) 09/14/2023   Anxiety, mild 10/04/2021   Difficulty walking 10/04/2021   Hearing deficit, bilateral 10/04/2021   Numbness and tingling 10/04/2021   Sensory ataxia 10/04/2021   Numbness and tingling of both feet 02/08/2021   GAD (generalized anxiety disorder) 02/04/2020   Confusion 02/04/2020   History of colonic polyps    Chronic pain of left ankle 10/07/2018   Bilateral hearing loss 08/21/2018   Cerebral ventriculomegaly 08/21/2018   Chronic pain of right knee 10/02/2016   Enthesopathy of hip 08/02/2009   Rotator cuff syndrome 10/04/2008   Chronic infection  of sinus 03/30/2008   HLD (hyperlipidemia) 04/09/2007   Former smoker, stopped smoking in distant past 11/23/2005   Allergic rhinitis 08/23/2005   Depressive disorder 08/23/2005   Impotence of organic origin 08/23/2005   Migraine without status migrainosus 08/23/2005    ONSET DATE: a couple years ago  REFERRING DIAG: R20.0,R20.2 (ICD-10-CM) - Numbness and tingling of both feet R26.9 (ICD-10-CM) - Abnormality of gait R26.89 (ICD-10-CM) - Balance problem Z91.81 (ICD-10-CM) - At high risk for falls  THERAPY DIAG:  History of falling  Unsteadiness on feet  Muscle weakness (generalized)  Other abnormalities of gait and mobility  Rationale for Evaluation and Treatment: Rehabilitation  SUBJECTIVE:  SUBJECTIVE STATEMENT: He ambulates in with rollator today.  He denies falls or acute changes.  Daughter-in-law notes they have had to slow him down maybe 3 times since getting it as he does accelerate a bit sometimes.  He is still okay with plan for discharge next visit. Pt accompanied by: self and family member-daughter-in-law [name]  PERTINENT HISTORY: Depression, cerebral ventriculomegaly, HLD, dementia, bilateral hearing loss, GAD, neuropathy  PAIN:  Are you having pain? No   PRECAUTIONS: Fall  RED FLAGS: None - pt has urge incontinence  WEIGHT BEARING RESTRICTIONS: No  FALLS: Has patient fallen in last 6 months? Yes. 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  Daughter-in-law endorses move to her home is long-term.  OBJECTIVE:   DIAGNOSTIC FINDINGS: No recent relevant imaging.  COGNITION: Overall cognitive status: History of cognitive impairments - at baseline-pt has STM deficits, increased processing time, and delayed recall related to his dementia   SENSATION: Light touch: WFL and N/T from ankle line down into toes  COORDINATION: LE RAMS:  WFL Heel-to-shin:  WFL bilaterally  EDEMA:  None noted in BLE  MUSCLE TONE: None noted in BLE, no clonus present bilaterally  POSTURE: forward head and weight shift right  LOWER EXTREMITY ROM:     Active  Right Eval Left Eval  Hip flexion Grossly WFL  Hip extension   Hip abduction   Hip adduction   Hip internal rotation   Hip external rotation   Knee flexion   Knee extension   Ankle dorsiflexion   Ankle plantarflexion   Ankle inversion    Ankle eversion     (Blank rows = not tested)  LOWER EXTREMITY MMT:    MMT Right Eval Left Eval  Hip flexion    Hip extension    Hip abduction    Hip adduction    Hip internal rotation    Hip external rotation    Knee flexion    Knee extension    Ankle dorsiflexion    Ankle plantarflexion    Ankle inversion    Ankle eversion    (Blank rows = not tested)  BED MOBILITY:  Sit to supine Complete Independence Supine to sit Complete Independence Rolling to Right Complete Independence Rolling to Left Complete Independence  TRANSFERS: Assistive device utilized: Quad cane small base  Sit to stand: SBA and CGA Stand to sit: SBA and CGA Chair to chair: SBA and CGA Floor:  varies based on patient cognition and positional preference (pt prefers posterior bump vs quadruped crawl)  GAIT: Gait pattern: step to pattern, step through pattern, decreased arm swing- Right, decreased arm swing- Left, decreased stride length, shuffling, and narrow BOS Distance walked: various clinic  distances Assistive device utilized: Quad cane small base Level of assistance: SBA and CGA Comments: Patient demonstrates progressive decrease in step size, but not like that of a festination pattern.  He tends to carry the cane vs using it creating trip hazard.  PT attempted to cue patient for gait pattern at end of session w/ inconsistent response to intervention.  Did discuss with patient and daughter the cognitive load of using a cane and  sometimes creating a fall risk due to these difficulties, but did not discourage trying to utilize safe gait pattern with NBQC option at home until further assessment and gait training can be completed.  FUNCTIONAL TESTS:  5 times sit to stand: 21.90 seconds w/ BUE support, pt requires repeat simple commands Timed up and go (TUG): To be assessed. 10 meter walk test: To be assessed. Berg Balance Scale: To be assessed.  PATIENT SURVEYS:  None completed due to cognitive demand.  TODAY'S TREATMENT:                                                                                                                              DATE: 09/24/2023 Assessed rollator and it is at appropriate pt height, explained ideal landmarks for adjustment.  Discussed brake safety when approaching sitting as pt forgets to lock brakes, but acknowledges mistake.  Discussed utilizing second person to manage rollator down stairs and pt using rail as normal vs trying to carry his rollator down the stairs.  Edu on contacting [employer] if needing any maintenance to AD over time. Pt ambulates x500 ft over unlevel sidewalk and grass using rollator at SBA level.  Discussed and utilized standing rest breaks to prevent excess momentum on decline or in general and to reset upright vs crouched posture.  Cued for improved step length and foot clearance vs shuffling.  Provided family education regarding deficits noted and cuing to provide.  Pt practiced curb step up and down x2 w/ cues for sequencing  and approximation to curb (SBA).  Demonstrated curb step indoors for [facility] with education on sequencing and simplification of steps w/ vs w/o use of brakes during task (pt does fine with SBA w/o utilization of brakes during step up or down). SciFit BLE (intermittent BUE used as needed w/ fatigue) at level 5.0 in hill mode x5 minutes for strengthening and large amplitude movement, frequent cuing to maintain large movements.  PATIENT EDUCATION: Education details:  Plan for discharge next visit.  See above for further. Person educated: Patient and Caregiver daughter-in-law Arline Asp Education method: Explanation Education comprehension: verbalized understanding and needs further education  HOME EXERCISE PROGRAM: Access Code: [email or web address] URL: [email or web address] Date: 04/30/2023 Prepared by: Camille Bal  Exercises - Sit to Stand with Armchair  - 1 x daily - 7 x weekly - 2 sets - 6 reps - Standing March with Counter Support  - 1 x daily - 7 x weekly - 2 sets - 20 reps - Standing with Head Rotation  - 1 x daily - 7 x weekly - 3 sets - 10 reps - Feet Together Balance at [employer] Eyes Open  - 1 x daily - 7 x weekly - 1 sets - 3 reps - 20-30 seconds hold - Corner Balance Feet Together With Eyes Closed  - 1 x daily - 7 x weekly - 1 sets - 3 reps - 15-20 seconds hold - Standing Tandem Balance with Counter  Support  - 1 x daily - 7 x weekly - 1 sets - 4 reps - 30 seconds hold  Provided fall prevention handout 04/30/2023.  You Can Walk For A Certain Length Of Time Each Day (with family and use AD for safe endurance)                          Walk 4 minutes 3 times per day.             Increase 1-2  minutes every week.             Work up to 15 minutes (1-2 times per day).               Example:                         Day 1-2           4-5 minutes     3 times per day                         Day 7-8           10-12 minutes 2-3 times per day                         Day 13-14        20-22 minutes 1-2 times per day  GOALS: Goals reviewed with patient? Yes  LONG TERM GOALS (at re-cert): Target date: 07/20/2023  Pt will be independent and compliant with BLE strengthening and balance focused HEP in order to maintain functional progress and improve mobility. Baseline: Compliant w/ supervision (9/24); compliant and IND per caregiver (11/8) Goal status: MET  2.  Patient will be compliant to formal walking program >/= 4 days per week w/ supervision from family to improve aerobic tolerance and ambulatory mechanics w/ appropriate AD. Baseline: Patient has not been compliant recently due to caregiver illness (9/24); at least 5 per caregiver (11/8) Goal status: MET  3.  PT to further assess rollator use outdoors over unlevel surfaces w/ order request placed as appropriate. Baseline: Order placed (11/5) Goal status: MET  4.  Pt will demonstrate TUG of </=13 seconds in order to decrease risk of falls and improve functional mobility using LRAD. Baseline: 28.53 seconds w/ SBQC (7/30); 17.15 seconds w/ SPC (9/24); 14.29 sec w/ SPC SBA (11/8) Goal status: PARTIALLY MET  5.  Pt will demonstrate a gait speed of >/=2.27 feet/sec in order to decrease risk for falls. Baseline: 0.55 m/sec OR 1.82 ft/sec (7/30); 2.07 ft/sec (9/24); 2.81 ft/sec w/ SPC SBA (11/8) Goal status: MET  6.  Pt will increase BERG balance score to >/=49/56 to demonstrate improved static balance. Baseline: 32/56 (7/30);  44/56 (9/24); 45/56 (11/8) Goal status: NOT MET  LONG TERM GOALS (at re-cert): Target date: 08/31/2023  1.  Pt will demonstrate TUG of </=13 seconds in order to decrease risk of falls and improve functional mobility using LRAD. Baseline: 14.29 sec w/ SPC SBA - average (11/8); 17.10 sec w/ SPC (12/18) Goal status: ONGOING  2.  Pt will demonstrate a gait speed of >/=3.01 feet/sec in order to decrease risk for falls. Baseline: 2.81 ft/sec w/ SPC SBA (11/8); 3.25 ft/sec w/ SPC (not well used)  (12/18) Goal status: MET  3.  Pt will increase BERG balance score to >/=49/56 to demonstrate improved  static balance. Baseline: 45/56 (11/8); 45/56 (12/18) Goal status: NOT MET  LONG TERM GOALS (at re-cert): Target date: 10/12/2023  1.  Pt will demonstrate TUG of </=13 seconds in order to decrease risk of falls and improve functional mobility using LRAD. Baseline: 14.29 sec w/ SPC SBA - average (11/8); 17.10 sec w/ SPC (12/18) Goal status: ONGOING  2.  Pt will demonstrate a gait speed of >/=3.45 feet/sec in order to decrease risk for falls. Baseline: 2.81 ft/sec w/ SPC SBA (11/8); 3.25 ft/sec w/ SPC (not well used) (12/18) Goal status: REVISED  3.  Pt will demonstrate safe navigation of personal rollator and features over variable surfaces in order to improve mobility in community environment.  Baseline: to receive rollator this afternoon (12/18)  Goal status:  INITIAL  ASSESSMENT:  CLINICAL IMPRESSION: Emphasis of skilled session today on adjusting pt rollator and finalizing education for patient and family regarding safest use over variable terrain.  He continues to be challenged with maintaining upright posture and preventing shuffling gait, but does response well to external cues and is able to correct temporarily when cued.  He has great family support and has made great progress with safe AD use under supervision.  They are in agreement to plan for discharge next visit.  OBJECTIVE IMPAIRMENTS: Abnormal gait, decreased activity tolerance, decreased balance, decreased cognition, difficulty walking, impaired sensation, improper body mechanics, and postural dysfunction.   ACTIVITY LIMITATIONS: carrying, lifting, bending, standing, squatting, stairs, transfers, reach over head, and locomotion level  PARTICIPATION LIMITATIONS: meal prep, cleaning, laundry, driving, shopping, and community activity  PERSONAL FACTORS: Age, Fitness, Past/current experiences, Time since onset of  injury/illness/exacerbation, and 1-2 comorbidities: dementia, neuropathy  are also affecting patient's functional outcome.   REHAB POTENTIAL: Fair See PMH and personal factors  CLINICAL DECISION MAKING: Evolving/moderate complexity  EVALUATION COMPLEXITY: Moderate  PLAN:  PT FREQUENCY: 1x/week  PT DURATION: 8 weeks + 4 weeks (at re-cert) + 4 weeks (at re-cert 16/09/958) + 4 weeks (re-cert 45/40 - will only schedule 2 weeks)  PLANNED INTERVENTIONS: Therapeutic exercises, Therapeutic activity, Neuromuscular re-education, Balance training, Gait training, Patient/Family education, Self Care, Stair training, Vestibular training, DME instructions, and Re-evaluation  PLAN FOR NEXT SESSION:  ASSESS LTGs - D/C!  Sadie Haber, PT, DPT 09/24/2023, 2:08 PM

## 2023-10-03 ENCOUNTER — Ambulatory Visit: Payer: PPO | Attending: Podiatry | Admitting: Physical Therapy

## 2023-10-03 ENCOUNTER — Encounter: Payer: Self-pay | Admitting: Physical Therapy

## 2023-10-03 DIAGNOSIS — R2681 Unsteadiness on feet: Secondary | ICD-10-CM | POA: Diagnosis not present

## 2023-10-03 DIAGNOSIS — R2689 Other abnormalities of gait and mobility: Secondary | ICD-10-CM | POA: Diagnosis not present

## 2023-10-03 DIAGNOSIS — Z9181 History of falling: Secondary | ICD-10-CM | POA: Diagnosis not present

## 2023-10-03 DIAGNOSIS — M6281 Muscle weakness (generalized): Secondary | ICD-10-CM | POA: Insufficient documentation

## 2023-10-03 NOTE — Therapy (Signed)
 OUTPATIENT PHYSICAL THERAPY NEURO TREATMENT - DISCHARGE SUMMARY   Patient Name: Cameron King MRN: [id number] DOB:December 20, 1945, 78 y.o., male Today's Date: 10/03/2023   PCP: Gasper Nancyann FORBES, MD REFERRING PROVIDER: Verta Royden DASEN, DPM  PHYSICAL THERAPY DISCHARGE SUMMARY  Visits from Start of Care: 19  Current functional level related to goals / functional outcomes: See clinical impression statement.   Remaining deficits: Need for rollator on unlevel surfaces and cane indoors for safest ambulation.  Supervision recommended due to cognitive changes.   Education / Equipment: Plan for discharge today and discussion of possible reasons for variability in goal performance with rollator vs cane.  Improvements noted with rollator and ongoing points to continue addressing with practice at home.  Confirmed pt has HEP and Dorthea will continue to help him with this.  Discussed returning with new referral if status change occurs to prevent loss of progress.   Patient agrees to discharge. Patient goals were partially met. Patient is being discharged due to maximized rehab potential.    END OF SESSION:   PT End of Session - 10/03/23 1109     Visit Number 19    Number of Visits 21   17 + 4 (only scheduling 2 of 4 visits due to holidays)   Date for PT Re-Evaluation 10/19/23   pushed out due to scheduling delay   Authorization Type [employer]    PT Start Time 1105    PT Stop Time 1143    PT Time Calculation (min) 38 min    Equipment Utilized During Treatment Gait belt    Activity Tolerance Patient tolerated treatment well    Behavior During Therapy WFL for tasks assessed/performed              Past Medical History:  Diagnosis Date   Depression    High cholesterol    Hypertension    Meniere disease    Ocular Migraines    Shingles 07/16/2015   of eye    Past Surgical History:  Procedure Laterality Date   COLONOSCOPY     COLONOSCOPY WITH PROPOFOL  N/A 10/29/2018    Procedure: COLONOSCOPY WITH PROPOFOL ;  Surgeon: Jinny Carmine, MD;  Location: [facility] ENDOSCOPY;  Service: Endoscopy;  Laterality: N/A;   SIGMOIDOSCOPY  2008   Dr. Sankar. Normal per patient report   Patient Active Problem List   Diagnosis Date Noted   Urinary incontinence 09/14/2023   Moderate dementia (HCC) 09/14/2023   Anxiety, mild 10/04/2021   Difficulty walking 10/04/2021   Hearing deficit, bilateral 10/04/2021   Numbness and tingling 10/04/2021   Sensory ataxia 10/04/2021   Numbness and tingling of both feet 02/08/2021   GAD (generalized anxiety disorder) 02/04/2020   Confusion 02/04/2020   History of colonic polyps    Chronic pain of left ankle 10/07/2018   Bilateral hearing loss 08/21/2018   Cerebral ventriculomegaly 08/21/2018   Chronic pain of right knee 10/02/2016   Enthesopathy of hip 08/02/2009   Rotator cuff syndrome 10/04/2008   Chronic infection of sinus 03/30/2008   HLD (hyperlipidemia) 04/09/2007   Former smoker, stopped smoking in distant past 11/23/2005   Allergic rhinitis 08/23/2005   Depressive disorder 08/23/2005   Impotence of organic origin 08/23/2005   Migraine without status migrainosus 08/23/2005    ONSET DATE: a couple years ago  REFERRING DIAG: R20.0,R20.2 (ICD-10-CM) - Numbness and tingling of both feet R26.9 (ICD-10-CM) - Abnormality of gait R26.89 (ICD-10-CM) - Balance problem Z91.81 (ICD-10-CM) - At high risk for falls  THERAPY DIAG:  History of falling  Unsteadiness on feet  Muscle weakness (generalized)  Other abnormalities of gait and mobility  Rationale for Evaluation and Treatment: Rehabilitation  SUBJECTIVE:                                                                                                                                                                                             SUBJECTIVE STATEMENT: He ambulates in with rollator today.  He denies falls or acute changes.  He is still okay with plan for discharge  today.  He has some mild neck pain today, but states this is normal for him some days. Pt accompanied by: self and family member-daughter-in-law [name]  PERTINENT HISTORY: Depression, cerebral ventriculomegaly, HLD, dementia, bilateral hearing loss, GAD, neuropathy  PAIN:  Are you having pain? Yes: NPRS scale: 2 Pain location: neck Pain description: ache Aggravating factors: turning head Relieving factors: unsure    PRECAUTIONS: Fall  RED FLAGS: None - pt has urge incontinence  WEIGHT BEARING RESTRICTIONS: No  FALLS: Has patient fallen in last 6 months? Yes. Number of falls >20  LIVING ENVIRONMENT: Lives with: lives with their family Lives in: House/apartment Stairs: Yes: External: 2 steps; none and column he can grab onto Has following equipment at home: Multimedia Programmer  PLOF: Needs assistance with ADLs, Needs assistance with homemaking, Needs assistance with gait, and Needs assistance with transfers  PATIENT GOALS: Patient would like to return to his home.  Daughter-in-law endorses move to her home is long-term.  OBJECTIVE:   DIAGNOSTIC FINDINGS: No recent relevant imaging.  COGNITION: Overall cognitive status: History of cognitive impairments - at baseline-pt has STM deficits, increased processing time, and delayed recall related to his dementia   SENSATION: Light touch: WFL and N/T from ankle line down into toes  COORDINATION: LE RAMS:  WFL Heel-to-shin:  WFL bilaterally  EDEMA:  None noted in BLE  MUSCLE TONE: None noted in BLE, no clonus present bilaterally  POSTURE: forward head and weight shift right  LOWER EXTREMITY ROM:     Active  Right Eval Left Eval  Hip flexion Grossly WFL  Hip extension   Hip abduction   Hip adduction   Hip internal rotation   Hip external rotation   Knee flexion   Knee extension   Ankle dorsiflexion   Ankle plantarflexion   Ankle inversion    Ankle eversion     (Blank rows = not tested)  LOWER  EXTREMITY MMT:    MMT Right Eval Left Eval  Hip flexion    Hip extension    Hip abduction    Hip  adduction    Hip internal rotation    Hip external rotation    Knee flexion    Knee extension    Ankle dorsiflexion    Ankle plantarflexion    Ankle inversion    Ankle eversion    (Blank rows = not tested)  BED MOBILITY:  Sit to supine Complete Independence Supine to sit Complete Independence Rolling to Right Complete Independence Rolling to Left Complete Independence  TRANSFERS: Assistive device utilized: Quad cane small base  Sit to stand: SBA and CGA Stand to sit: SBA and CGA Chair to chair: SBA and CGA Floor:  varies based on patient cognition and positional preference (pt prefers posterior bump vs quadruped crawl)  GAIT: Gait pattern: step to pattern, step through pattern, decreased arm swing- Right, decreased arm swing- Left, decreased stride length, shuffling, and narrow BOS Distance walked: various clinic distances Assistive device utilized: Quad cane small base Level of assistance: SBA and CGA Comments: Patient demonstrates progressive decrease in step size, but not like that of a festination pattern.  He tends to carry the cane vs using it creating trip hazard.  PT attempted to cue patient for gait pattern at end of session w/ inconsistent response to intervention.  Did discuss with patient and daughter the cognitive load of using a cane and sometimes creating a fall risk due to these difficulties, but did not discourage trying to utilize safe gait pattern with NBQC option at home until further assessment and gait training can be completed.  FUNCTIONAL TESTS:  5 times sit to stand: 21.90 seconds w/ BUE support, pt requires repeat simple commands Timed up and go (TUG): To be assessed. 10 meter walk test: To be assessed. Berg Balance Scale: To be assessed.  PATIENT SURVEYS:  None completed due to cognitive demand.  TODAY'S TREATMENT:                                                                                                                               DATE: 10/03/2023 Assessed rollator use over 200 ft level indoor surface and 200 ft outdoors over unlevel sidewalk and grass, some mildly increased descent but much improved brake utilization and improved turn radius.  Discussed continued practice with curb management (pt still preferring to pick up rollator vs bump up) and to continue cuing for decreased speed when going downhill and use of temporary brakes as needed. :  12.19 sec w/ rollator = 0.82 m/sec OR 2.71 ft/sec TUG:  24.37 sec w/ rollator; pt utilizing rollator well and sequencing better  PATIENT EDUCATION: Education details:  Plan for discharge today and discussion of possible reasons for variability in goal performance with rollator vs cane.  Improvements noted with rollator and ongoing points to continue addressing with practice at home.  Confirmed pt has HEP and Dorthea will continue to help him with this.  Discussed returning with new referral if status change occurs to prevent loss of progress. Person educated: Patient  and Caregiver daughter-in-law Dorthea Education method: Explanation Education comprehension: verbalized understanding and needs further education  HOME EXERCISE PROGRAM: Access Code: [email or web address] URL: [email or web address] Date: 04/30/2023 Prepared by: Daved Bull  Exercises - Sit to Stand with Armchair  - 1 x daily - 7 x weekly - 2 sets - 6 reps - Standing March with Counter Support  - 1 x daily - 7 x weekly - 2 sets - 20 reps - Standing with Head Rotation  - 1 x daily - 7 x weekly - 3 sets - 10 reps - Feet Together Balance at [employer] Eyes Open  - 1 x daily - 7 x weekly - 1 sets - 3 reps - 20-30 seconds hold - Corner Balance Feet Together With Eyes Closed  - 1 x daily - 7 x weekly - 1 sets - 3 reps - 15-20 seconds hold - Standing Tandem Balance with Counter Support  - 1 x daily - 7 x weekly - 1 sets - 4  reps - 30 seconds hold  Provided fall prevention handout 04/30/2023.  You Can Walk For A Certain Length Of Time Each Day (with family and use AD for safe endurance)                          Walk 4 minutes 3 times per day.             Increase 1-2  minutes every week.             Work up to 15 minutes (1-2 times per day).               Example:                         Day 1-2           4-5 minutes     3 times per day                         Day 7-8           10-12 minutes 2-3 times per day                         Day 13-14       20-22 minutes 1-2 times per day  GOALS: Goals reviewed with patient? Yes  LONG TERM GOALS (at re-cert): Target date: 07/20/2023  Pt will be independent and compliant with BLE strengthening and balance focused HEP in order to maintain functional progress and improve mobility. Baseline: Compliant w/ supervision (9/24); compliant and IND per caregiver (11/8) Goal status: MET  2.  Patient will be compliant to formal walking program >/= 4 days per week w/ supervision from family to improve aerobic tolerance and ambulatory mechanics w/ appropriate AD. Baseline: Patient has not been compliant recently due to caregiver illness (9/24); at least 5 per caregiver (11/8) Goal status: MET  3.  PT to further assess rollator use outdoors over unlevel surfaces w/ order request placed as appropriate. Baseline: Order placed (11/5) Goal status: MET  4.  Pt will demonstrate TUG of </=13 seconds in order to decrease risk of falls and improve functional mobility using LRAD. Baseline: 28.53 seconds w/ SBQC (7/30); 17.15 seconds w/ SPC (9/24); 14.29 sec w/ SPC SBA (11/8) Goal status: PARTIALLY MET  5.  Pt will demonstrate a gait speed of >/=2.27 feet/sec  in order to decrease risk for falls. Baseline: 0.55 m/sec OR 1.82 ft/sec (7/30); 2.07 ft/sec (9/24); 2.81 ft/sec w/ SPC SBA (11/8) Goal status: MET  6.  Pt will increase BERG balance score to >/=49/56 to demonstrate improved  static balance. Baseline: 32/56 (7/30);  44/56 (9/24); 45/56 (11/8) Goal status: NOT MET  LONG TERM GOALS (at re-cert): Target date: 08/31/2023  1.  Pt will demonstrate TUG of </=13 seconds in order to decrease risk of falls and improve functional mobility using LRAD. Baseline: 14.29 sec w/ SPC SBA - average (11/8); 17.10 sec w/ SPC (12/18) Goal status: ONGOING  2.  Pt will demonstrate a gait speed of >/=3.01 feet/sec in order to decrease risk for falls. Baseline: 2.81 ft/sec w/ SPC SBA (11/8); 3.25 ft/sec w/ SPC (not well used) (12/18) Goal status: MET  3.  Pt will increase BERG balance score to >/=49/56 to demonstrate improved static balance. Baseline: 45/56 (11/8); 45/56 (12/18) Goal status: NOT MET  LONG TERM GOALS (at re-cert): Target date: 10/12/2023  1.  Pt will demonstrate TUG of </=13 seconds in order to decrease risk of falls and improve functional mobility using LRAD. Baseline: 14.29 sec w/ SPC SBA - average (11/8); 17.10 sec w/ SPC (12/18); 24.37 sec w/ rollator (1/8) Goal status: NOT MET  2.  Pt will demonstrate a gait speed of >/=3.45 feet/sec in order to decrease risk for falls. Baseline: 2.81 ft/sec w/ SPC SBA (11/8); 3.25 ft/sec w/ SPC (not well used) (12/18); 2.71 ft/sec w/ rollator (1/8) Goal status: NOT MET  3.  Pt will demonstrate safe navigation of personal rollator and features over variable surfaces in order to improve mobility in community environment.  Baseline: to receive rollator this afternoon (12/18); mildly increased descent speed, improved brake use and turn radius (1/8)  Goal status:  PARTIALLY MET  ASSESSMENT:  CLINICAL IMPRESSION: Patient did not meet 2 of 3 goals as written this visit partially due to improved pacing with rollator vs cane as used in prior assessments.  Patient ambulating at slower speed, but is demonstrating improved brake management, sequencing, and turning with rollator.  He continues utilizing the cane for short or level  distances with supervision.  He has his established HEP with good family support to continue working on mobility.  Overall, he is appropriate for and in agreement to discharge to home management.  Daughter-in-law has good understanding of next steps and how to return as needed in the future.  Will proceed with discharge.  OBJECTIVE IMPAIRMENTS: Abnormal gait, decreased activity tolerance, decreased balance, decreased cognition, difficulty walking, impaired sensation, improper body mechanics, and postural dysfunction.   ACTIVITY LIMITATIONS: carrying, lifting, bending, standing, squatting, stairs, transfers, reach over head, and locomotion level  PARTICIPATION LIMITATIONS: meal prep, cleaning, laundry, driving, shopping, and community activity  PERSONAL FACTORS: Age, Fitness, Past/current experiences, Time since onset of injury/illness/exacerbation, and 1-2 comorbidities: dementia, neuropathy  are also affecting patient's functional outcome.   REHAB POTENTIAL: Fair See PMH and personal factors  CLINICAL DECISION MAKING: Evolving/moderate complexity  EVALUATION COMPLEXITY: Moderate  PLAN:  PT FREQUENCY: 1x/week  PT DURATION: 8 weeks + 4 weeks (at re-cert) + 4 weeks (at re-cert 88/09/7973) + 4 weeks (re-cert 87/81 - will only schedule 2 weeks)  PLANNED INTERVENTIONS: Therapeutic exercises, Therapeutic activity, Neuromuscular re-education, Balance training, Gait training, Patient/Family education, Self Care, Stair training, Vestibular training, DME instructions, and Re-evaluation  PLAN FOR NEXT SESSION:  N/A  Daved KATHEE Bull, PT, DPT 10/03/2023, 11:41 AM

## 2023-10-19 ENCOUNTER — Ambulatory Visit (INDEPENDENT_AMBULATORY_CARE_PROVIDER_SITE_OTHER): Payer: PPO | Admitting: Family Medicine

## 2023-10-19 VITALS — BP 112/75 | HR 74 | Temp 97.5°F | Resp 14 | Ht 67.0 in | Wt 198.7 lb

## 2023-10-19 DIAGNOSIS — E7800 Pure hypercholesterolemia, unspecified: Secondary | ICD-10-CM

## 2023-10-19 DIAGNOSIS — F411 Generalized anxiety disorder: Secondary | ICD-10-CM

## 2023-10-19 DIAGNOSIS — F32A Depression, unspecified: Secondary | ICD-10-CM | POA: Diagnosis not present

## 2023-10-19 DIAGNOSIS — R202 Paresthesia of skin: Secondary | ICD-10-CM

## 2023-10-19 DIAGNOSIS — R278 Other lack of coordination: Secondary | ICD-10-CM

## 2023-10-19 DIAGNOSIS — R200 Anesthesia of skin: Secondary | ICD-10-CM | POA: Diagnosis not present

## 2023-10-19 DIAGNOSIS — Z125 Encounter for screening for malignant neoplasm of prostate: Secondary | ICD-10-CM

## 2023-10-19 DIAGNOSIS — R262 Difficulty in walking, not elsewhere classified: Secondary | ICD-10-CM | POA: Diagnosis not present

## 2023-10-20 ENCOUNTER — Other Ambulatory Visit: Payer: Self-pay | Admitting: Family Medicine

## 2023-10-20 DIAGNOSIS — E7800 Pure hypercholesterolemia, unspecified: Secondary | ICD-10-CM

## 2023-10-22 NOTE — Telephone Encounter (Signed)
Requested medications are due for refill today.  yes  Requested medications are on the active medications list.  yes  Last refill. 06/04/2023 #90 0 rf  Future visit scheduled.   yes  Notes to clinic.  Labs are expired.    Requested Prescriptions  Pending Prescriptions Disp Refills   pravastatin (PRAVACHOL) 40 MG tablet [Pharmacy Med Name: Pravastatin Sodium 40 MG Oral Tablet] 90 tablet 0    Sig: TAKE 1 TABLET BY MOUTH ONCE DAILY . APPOINTMENT REQUIRED FOR FUTURE REFILLS     Cardiovascular:  Antilipid - Statins Failed - 10/22/2023  4:49 PM      Failed - Lipid Panel in normal range within the last 12 months    Cholesterol, Total  Date Value Ref Range Status  06/05/2022 177 100 - 199 mg/dL Final   LDL Chol Calc (NIH)  Date Value Ref Range Status  06/05/2022 122 (H) 0 - 99 mg/dL Final   HDL  Date Value Ref Range Status  06/05/2022 33 (L) >39 mg/dL Final   Triglycerides  Date Value Ref Range Status  06/05/2022 119 0 - 149 mg/dL Final         Passed - Patient is not pregnant      Passed - Valid encounter within last 12 months    Recent Outpatient Visits           3 days ago Depressive disorder   [facility] [facility] Malva Limes, MD   1 month ago Urinary incontinence, unspecified type   [place] [facility], Jennette Kettle, FNP   1 year ago GAD (generalized anxiety disorder)   [place] [facility] Malva Limes, MD   2 years ago Annual physical exam   [place] [facility] Malva Limes, MD   2 years ago Numbness and tingling of both feet   [facility] Malva Limes, MD       Future Appointments             In 1 month Fisher, Demetrios Isaacs, MD [facility]             b

## 2023-10-24 NOTE — Progress Notes (Signed)
Established patient visit   Patient: Cameron King   DOB: September 04, 1946   78 y.o. Male  MRN: [id number] Visit Date: 10/19/2023  Today's healthcare provider: Mila Merry, MD   Chief Complaint  Patient presents with   Follow-up    Just a check up and f/u labs   Subjective    Discussed the use of AI scribe software for clinical note transcription with the patient, who gave verbal consent to proceed.  History of Present Illness   Cameron King, a patient with a history of Meniere's disease, urinary incontinence, and a recent rash, presents for a routine checkup. The rash, initially thought to be an ingrown hair, had spread from one leg to the other. However, the patient believes it has resolved after diligent application of topical treatments.  The patient has been experiencing urinary incontinence, which he attributes to forgetting to urinate due to cognitive lapses. As a result, he has been limiting fluid intake to reduce urinary frequency, which is not an ideal solution.  The patient has also been experiencing falls, with over twenty incidents reported in the past year. Physical therapy was initiated in June and has recently concluded. The patient has been using a rollator for longer distances, but does not require it for short distances within the house. This intervention appears to be helping.  The patient's Meniere's disease has flared up recently, leading to the initiation of hydrochlorothiazide therapy. The patient reports a constant noise in his ear, which he hopes the medication will alleviate. He is unsure if a hearing aid would be beneficial for this symptom.  The patient is also on pravastatin, which he needs a refill for. He has not been experiencing any shortness of breath or swelling in his hands, feet, or ankles. He is under the care of a neurologist, Dr. Malvin Johns, and has recently undergone a memory test. The patient has voluntarily given up his driver's license and is  currently living with family.       Medications: Outpatient Medications Prior to Visit  Medication Sig   aspirin EC 81 MG tablet Take 81 mg by mouth daily. Swallow whole.   busPIRone (BUSPAR) 15 MG tablet Take 15 mg by mouth 2 (two) times daily.   docusate sodium (COLACE) 50 MG capsule Take 50 mg by mouth every other day. (Patient taking differently: Take 50 mg by mouth daily as needed.)   gabapentin (NEURONTIN) 400 MG capsule Take 400 mg by mouth 3 (three) times daily. TAKE 400 mg twice a day for 1 week, then 400 mg three time a day for 1 week, then 400 mg four times per day. (Patient taking differently: Take 400 mg by mouth 3 (three) times daily. TAKE 400 mg 3x a day for 1 week, then 400 mg three time a day for 1 week, then 400 mg four times per day.)   hydrochlorothiazide (HYDRODIURIL) 25 MG tablet Take 25 mg by mouth daily.   hydrocortisone 2.5 % lotion Apply topically as needed.    ibuprofen (ADVIL,MOTRIN) 200 MG tablet Take 200 mg by mouth every 6 (six) hours as needed.   loratadine (CLARITIN) 10 MG tablet Take 1 tablet by mouth daily as needed.   memantine (NAMENDA) 5 MG tablet Take 5 mg by mouth daily.   Multiple Vitamins-Minerals (EQ COMPLETE MULTIVITAMIN-ADULT PO) Take by mouth daily at 6 (six) AM.   naproxen sodium (ALEVE) 220 MG tablet Take 220 mg by mouth as needed.   sertraline (ZOLOFT) 100 MG  tablet Take 1 tablet (100 mg total) by mouth daily. (Patient taking differently: Take 150 mg by mouth daily.)   sildenafil (VIAGRA) 100 MG tablet Take 1 tablet by mouth daily as needed.   [DISCONTINUED] pravastatin (PRAVACHOL) 40 MG tablet TAKE 1 TABLET BY MOUTH ONCE DAILY . APPOINTMENT REQUIRED FOR FUTURE REFILLS   No facility-administered medications prior to visit.       Objective    BP 112/75 (BP Location: Left Arm, Patient Position: Sitting, Cuff Size: Normal)   Pulse 74   Temp (!) 97.5 F (36.4 C)   Resp 14   Ht 5\' 7"  (1.702 m)   Wt 198 lb 11.2 oz (90.1 kg)   SpO2 96%    BMI 31.12 kg/m    Physical Exam   CHEST: Lungs clear to auscultation. EXTREMITIES: No edema in ankles. SKIN: Rash on one leg, possibly initiated by an ingrown hair, spread to the other leg.    Assessment & Plan       Skin Rash Resolved with topical treatment. -No further intervention needed at this time.  Urinary Frequency Possibly due to decreased fluid intake and forgetting to void. No incontinence reported. -Encouraged to maintain adequate hydration and regular voiding.  Mobility and Falls Improvement noted with the use of a rollator for longer distances. No falls reported recently. -Continue use of rollator as needed.  Meniere's Disease Recent flare-up managed with Hydrochlorothiazide per ENT -Continue Hydrochlorothiazide as prescribed. -Notify primary care provider if symptoms worsen.  Hyperlipidemia Request for medication refill. -Refill pravastatin -Order lipid panel to monitor response to treatment.  General Health Maintenance -Order Met C due to new use of Hydrochlorothiazide. check CBC, PSA and TSH -Plan for patient to have labs drawn next week.    No follow-ups on file.      Mila Merry, MD  [facility] [phone] (phone) [phone] (fax)  [facility]

## 2023-10-24 NOTE — Patient Instructions (Signed)
[name] Kitchen  Please review the attached list of medications and notify my office if there are any errors.   . Please bring all of your medications to every appointment so we can make sure that our medication list is the same as yours.

## 2023-10-30 ENCOUNTER — Ambulatory Visit: Payer: PPO

## 2023-10-30 VITALS — BP 114/80 | Ht 67.0 in | Wt 202.3 lb

## 2023-10-30 DIAGNOSIS — Z0000 Encounter for general adult medical examination without abnormal findings: Secondary | ICD-10-CM

## 2023-10-30 DIAGNOSIS — Z125 Encounter for screening for malignant neoplasm of prostate: Secondary | ICD-10-CM | POA: Diagnosis not present

## 2023-10-30 DIAGNOSIS — E7800 Pure hypercholesterolemia, unspecified: Secondary | ICD-10-CM | POA: Diagnosis not present

## 2023-10-30 NOTE — Progress Notes (Signed)
 Subjective:   Cameron King is a 78 y.o. male who presents for Medicare Annual/Subsequent preventive examination.  Visit Complete: In person  Cardiac Risk Factors include: advanced age (>24men, >53 women);dyslipidemia;male gender;obesity (BMI >30kg/m2)     Objective:    Today's Vitals   10/30/23 1044 10/30/23 1049  BP: 114/80   Weight: 202 lb 4.8 oz (91.8 kg)   Height: 5' 7 (1.702 m)   PainSc:  3    Body mass index is 31.68 kg/m.     10/30/2023   11:00 AM 04/19/2023    2:10 PM 10/09/2022   10:48 AM 02/27/2022    1:21 PM 10/05/2021   11:16 AM 09/30/2020   10:40 AM 09/29/2019   10:58 AM  Advanced Directives  Does Patient Have a Medical Advance Directive? No Yes No No No No No  Type of Advance Directive  Healthcare Power of Attorney       Does patient want to make changes to medical advance directive?  No - Patient declined       Would patient like information on creating a medical advance directive? No - Patient declined  No - Patient declined  No - Patient declined No - Patient declined No - Patient declined    Current Medications (verified) Outpatient Encounter Medications as of 10/30/2023  Medication Sig   aspirin EC 81 MG tablet Take 81 mg by mouth daily. Swallow whole.   busPIRone  (BUSPAR ) 15 MG tablet Take 15 mg by mouth 2 (two) times daily.   docusate sodium (COLACE) 50 MG capsule Take 50 mg by mouth every other day. (Patient taking differently: Take 50 mg by mouth daily as needed.)   gabapentin  (NEURONTIN ) 400 MG capsule Take 400 mg by mouth 3 (three) times daily. TAKE 400 mg twice a day for 1 week, then 400 mg three time a day for 1 week, then 400 mg four times per day. (Patient taking differently: Take 400 mg by mouth 3 (three) times daily. TAKE 400 mg 3x a day for 1 week, then 400 mg three time a day for 1 week, then 400 mg four times per day.)   hydrochlorothiazide (HYDRODIURIL) 25 MG tablet Take 25 mg by mouth daily.   hydrocortisone 2.5 % lotion Apply topically as  needed.    loratadine (CLARITIN) 10 MG tablet Take 1 tablet by mouth daily as needed.   memantine  (NAMENDA ) 5 MG tablet Take 5 mg by mouth daily.   Multiple Vitamins-Minerals (EQ COMPLETE MULTIVITAMIN-ADULT PO) Take by mouth daily at 6 (six) AM.   naproxen  sodium (ALEVE ) 220 MG tablet Take 220 mg by mouth as needed.   pravastatin  (PRAVACHOL ) 40 MG tablet Take 1 tablet (40 mg total) by mouth daily.   sertraline  (ZOLOFT ) 100 MG tablet Take 1 tablet (100 mg total) by mouth daily. (Patient taking differently: Take 150 mg by mouth daily.)   ibuprofen (ADVIL,MOTRIN) 200 MG tablet Take 200 mg by mouth every 6 (six) hours as needed. (Patient not taking: Reported on 10/30/2023)   sildenafil (VIAGRA) 100 MG tablet Take 1 tablet by mouth daily as needed. (Patient not taking: Reported on 10/30/2023)   No facility-administered encounter medications on file as of 10/30/2023.    Allergies (verified) Prednisone   History: Past Medical History:  Diagnosis Date   Depression    High cholesterol    Hypertension    Meniere disease    Ocular Migraines    Shingles 07/16/2015   of eye    Past Surgical History:  Procedure Laterality Date   COLONOSCOPY     COLONOSCOPY WITH PROPOFOL  N/A 10/29/2018   Procedure: COLONOSCOPY WITH PROPOFOL ;  Surgeon: Jinny Carmine, MD;  Location: [facility] END[facility]COPY;  Service: Endoscopy;  Laterality: N/A;   SIGMOIDOSCOPY  2008   Dr. Sankar. Normal per patient report   Family History  Adopted: Yes  Problem Relation Age of Onset   Cancer - Other Father    Stroke Son    Social History   Socioeconomic History   Marital status: Widowed    Spouse name: Not on file   Number of children: 2   Years of education: Not on file   Highest education level: Associate degree: occupational, scientist, product/process development, or vocational program  Occupational History   Occupation: Retired    Comment: Retired 2009  Tobacco Use   Smoking status: Former    Types: Cigarettes   Smokeless tobacco: Never   Tobacco  comments:    quit over 30 years ago  Vaping Use   Vaping status: Never Used  Substance and Sexual Activity   Alcohol use: Yes    Alcohol/week: 0.0 standard drinks of alcohol    Comment: rare wine   Drug use: No   Sexual activity: Not on file  Other Topics Concern   Not on file  Social History Narrative   Pt currently has a girlfriend who has had a heart attack recently. This has added to his stress level.    Social Drivers of Corporate Investment Banker Strain: Low Risk  (10/30/2023)   Overall Financial Resource Strain (CARDIA)    Difficulty of Paying Living Expenses: Not hard at all  Food Insecurity: No Food Insecurity (10/30/2023)   Hunger Vital Sign    Worried About Running Out of Food in the Last Year: Never true    Ran Out of Food in the Last Year: Never true  Transportation Needs: No Transportation Needs (10/30/2023)   PRAPARE - Administrator, Civil Service (Medical): No    Lack of Transportation (Non-Medical): No  Physical Activity: Sufficiently Active (10/30/2023)   Exercise Vital Sign    Days of Exercise per Week: 5 days    Minutes of Exercise per Session: 40 min  Recent Concern: Physical Activity - Insufficiently Active (10/30/2023)   Exercise Vital Sign    Days of Exercise per Week: 5 days    Minutes of Exercise per Session: 20 min  Stress: No Stress Concern Present (10/30/2023)   [employer] of Occupational Health - Occupational Stress Questionnaire    Feeling of Stress : Not at all  Social Connections: Moderately Isolated (10/30/2023)   Social Connection and Isolation Panel [NHANES]    Frequency of Communication with Friends and Family: More than three times a week    Frequency of Social Gatherings with Friends and Family: More than three times a week    Attends Religious Services: More than 4 times per year    Active Member of [employer] or Organizations: No    Attends Banker Meetings: Never    Marital Status: Widowed    Tobacco  Counseling Counseling given: Not Answered Tobacco comments: quit over 30 years ago   Clinical Intake:  Pre-visit preparation completed: Yes  Pain : 0-10 Pain Score: 3  Pain Type: Chronic pain Pain Location: Leg Pain Orientation: Right, Left Pain Descriptors / Indicators: Aching, Constant Pain Onset: More than a month ago Pain Frequency: Constant     BMI - recorded: 31.68 Nutritional Status: BMI > 30  Obese Nutritional Risks: None Diabetes: No  How often do you need to have someone help you when you read instructions, pamphlets, or other written materials from your doctor or pharmacy?: 1 - Never  Interpreter Needed?: No  Information entered by :: JHONNIE DAS, LPN   Activities of Daily Living    10/30/2023   11:02 AM  In your present state of health, do you have any difficulty performing the following activities:  Hearing? 1  Vision? 0  Difficulty concentrating or making decisions? 1  Walking or climbing stairs? 1  Dressing or bathing? 0  Doing errands, shopping? 0  Preparing Food and eating ? N  Using the Toilet? N  In the past six months, have you accidently leaked urine? N  Do you have problems with loss of bowel control? N  Managing your Medications? Y  Managing your Finances? Y  Housekeeping or managing your Housekeeping? Y    Patient Care Team: Gasper Nancyann BRAVO, MD as PCP - General (Family Medicine) Jinny Carmine, MD as Consulting Physician (Gastroenterology) Lane Arthea BRAVO, MD as Referring Physician (Neurology) Milissa Hamming, MD as Referring Physician (Otolaryngology) [facility], [facility] (Optometry)  Indicate any recent Medical Services you may have received from other than [facility] providers in the past year (date may be approximate).     Assessment:   This is a routine wellness examination for Cari.  Hearing/Vision screen Hearing Screening - Comments:: NO AIDS- MD HAS RECOMMENDED Vision Screening - Comments:: READERS- [facility]    Goals Addressed             This Visit's Progress    Cut out extra servings         Depression Screen    10/30/2023   10:58 AM 10/30/2023   10:57 AM 10/19/2023   11:14 AM 09/14/2023   10:32 AM 10/09/2022   10:46 AM 06/02/2022   10:58 AM 10/05/2021   11:11 AM  PHQ 2/9 Scores  PHQ - 2 Score 2 2 2  0 2 0 2  PHQ- 9 Score 3 3 9   6 5     Fall Risk    10/30/2023   11:01 AM 09/14/2023   10:31 AM 10/09/2022   10:49 AM 06/02/2022   10:55 AM 10/05/2021   11:17 AM  Fall Risk   Falls in the past year? 1 1 1 1 1   Number falls in past yr: 1 1 1  0 1  Injury with Fall? 0 0 0 0 0  Risk for fall due to : Impaired balance/gait Impaired balance/gait History of fall(s) No Fall Risks History of fall(s)  Follow up Falls evaluation completed;Falls prevention discussed Falls evaluation completed Falls evaluation completed;Falls prevention discussed Falls evaluation completed Falls prevention discussed  Comment  Patient doing PT and using rollator and cane       MEDICARE RISK AT HOME: Medicare Risk at Home Any stairs in or around the home?: Yes If so, are there any without handrails?: No Home free of loose throw rugs in walkways, pet beds, electrical cords, etc?: Yes Adequate lighting in your home to reduce risk of falls?: Yes Life alert?: No Use of a cane, walker or w/c?: Yes (CANE OR ROLLATOR WHEN OUT OF HOUSE) Grab bars in the bathroom?: Yes Shower chair or bench in shower?: Yes Elevated toilet seat or a handicapped toilet?: No  TIMED UP AND GO:  Was the test performed?  Yes  Length of time to ambulate 10 feet: 5 sec Gait slow and steady  with assistive device    Cognitive Function:        10/30/2023   11:04 AM 10/09/2022   10:54 AM  6CIT Screen  What Year? 0 points 4 points  What month? 0 points 3 points  What time? 3 points 0 points  Count back from 20 0 points 0 points  Months in reverse 4 points 4 points  Repeat phrase 0 points 4 points  Total Score 7 points 15 points     Immunizations Immunization History  Administered Date(s) Administered   Fluad Quad(high Dose 65+) 06/21/2020, 10/05/2021, 06/02/2022   Influenza, High Dose Seasonal PF 07/19/2015, 08/03/2016, 06/28/2017, 06/12/2018   Influenza,inj,Quad PF,6+ Mos 07/28/2019   PFIZER(Purple Top)SARS-COV-2 Vaccination 11/07/2019, 12/03/2019, 08/09/2020   Pneumococcal Conjugate-13 08/24/2014   Pneumococcal Polysaccharide-23 05/01/2012   Td 09/26/2003   Tdap 07/19/2015    TDAP status: Up to date  Flu Vaccine status: Up to date- GOT AT [facility]   Pneumococcal vaccine status: Declined,  Education has been provided regarding the importance of this vaccine but patient still declined. Advised may receive this vaccine at local pharmacy or Health Dept. Aware to provide a copy of the vaccination record if obtained from local pharmacy or Health Dept. Verbalized acceptance and understanding.   Covid-19 vaccine status: Completed vaccines  Qualifies for Shingles Vaccine? Yes   Zostavax completed No   Shingrix  Completed?: No.    Education has been provided regarding the importance of this vaccine. Patient has been advised to call insurance company to determine out of pocket expense if they have not yet received this vaccine. Advised may also receive vaccine at local pharmacy or Health Dept. Verbalized acceptance and understanding.  Screening Tests Health Maintenance  Topic Date Due   Zoster Vaccines- Shingrix  (1 of 2) Never done   COVID-19 Vaccine (4 - 2024-25 season) 05/27/2023   Colonoscopy  10/30/2023   INFLUENZA VACCINE  12/24/2023 (Originally 04/26/2023)   Medicare Annual Wellness (AWV)  10/29/2024   DTaP/Tdap/Td (3 - Td or Tdap) 07/18/2025   Pneumonia Vaccine 34+ Years old  Completed   Hepatitis C Screening  Completed   HPV VACCINES  Aged Out    Health Maintenance  Health Maintenance Due  Topic Date Due   Zoster Vaccines- Shingrix  (1 of 2) Never done   COVID-19 Vaccine (4 - 2024-25 season)  05/27/2023   Colonoscopy  10/30/2023    Colorectal cancer screening: No longer required.   Lung Cancer Screening: (Low Dose CT Chest recommended if Age 65-80 years, 20 pack-year currently smoking OR have quit w/in 15years.) does not qualify.    Additional Screening:  Hepatitis C Screening: does qualify; Completed 05/01/12  Vision Screening: Recommended annual ophthalmology exams for early detection of glaucoma and other disorders of the eye. Is the patient up to date with their annual eye exam?  Yes  Who is the provider or what is the name of the office in which the patient attends annual eye exams? [facility] If pt is not established with a provider, would they like to be referred to a provider to establish care? No .   Dental Screening: Recommended annual dental exams for proper oral hygiene   Community Resource Referral / Chronic Care Management: CRR required this visit?  No   CCM required this visit?  No     Plan:     I have personally reviewed and noted the following in the patient's chart:   Medical and social history Use of alcohol, tobacco or illicit drugs  Current medications and supplements including opioid prescriptions. Patient is not currently taking opioid prescriptions. Functional ability and status Nutritional status Physical activity Advanced directives List of other physicians Hospitalizations, surgeries, and ER visits in previous 12 months Vitals Screenings to include cognitive, depression, and falls Referrals and appointments  In addition, I have reviewed and discussed with patient certain preventive protocols, quality metrics, and best practice recommendations. A written personalized care plan for preventive services as well as general preventive health recommendations were provided to patient.     Jhonnie GORMAN Das, LPN   03/29/7973   After Visit Summary: (In Person-Declined) Patient declined AVS at this time.  Nurse Notes: NONE

## 2023-10-30 NOTE — Patient Instructions (Addendum)
 [name] , Thank you for taking time to come for your Medicare Wellness Visit. I appreciate your ongoing commitment to your health goals. Please review the following plan we discussed and let me know if I can assist you in the future.   Referrals/Orders/Follow-Ups/Clinician Recommendations: NONE  This is a list of the screening recommended for you and due dates:  Health Maintenance  Topic Date Due   Zoster (Shingles) Vaccine (1 of 2) Never done   COVID-19 Vaccine (4 - 2024-25 season) 05/27/2023   Colon Cancer Screening  10/30/2023   Flu Shot  12/24/2023*   Medicare Annual Wellness Visit  10/29/2024   DTaP/Tdap/Td vaccine (3 - Td or Tdap) 07/18/2025   Pneumonia Vaccine  Completed   Hepatitis C Screening  Completed   HPV Vaccine  Aged Out  *Topic was postponed. The date shown is not the original due date.    Advanced directives: (ACP Link)Information on Advanced Care Planning can be found at [place]  Secretary of [facility] Advance Health Care Directives Advance Health Care Directives ([email or web address]   Next Medicare Annual Wellness Visit scheduled for next year: Yes   11/04/24 @ 10:50 AM IN PERSON

## 2023-10-31 ENCOUNTER — Encounter: Payer: Self-pay | Admitting: Family Medicine

## 2023-10-31 LAB — COMPREHENSIVE METABOLIC PANEL
ALT: 21 [IU]/L (ref 0–44)
AST: 21 [IU]/L (ref 0–40)
Albumin: 4.4 g/dL (ref 3.8–4.8)
Alkaline Phosphatase: 133 [IU]/L — ABNORMAL HIGH (ref 44–121)
BUN/Creatinine Ratio: 15 (ref 10–24)
BUN: 19 mg/dL (ref 8–27)
Bilirubin Total: 0.7 mg/dL (ref 0.0–1.2)
CO2: 25 mmol/L (ref 20–29)
Calcium: 9.5 mg/dL (ref 8.6–10.2)
Chloride: 105 mmol/L (ref 96–106)
Creatinine, Ser: 1.28 mg/dL — ABNORMAL HIGH (ref 0.76–1.27)
Globulin, Total: 1.6 g/dL (ref 1.5–4.5)
Glucose: 96 mg/dL (ref 70–99)
Potassium: 4.2 mmol/L (ref 3.5–5.2)
Sodium: 145 mmol/L — ABNORMAL HIGH (ref 134–144)
Total Protein: 6 g/dL (ref 6.0–8.5)
eGFR: 58 mL/min/{1.73_m2} — ABNORMAL LOW (ref >59)

## 2023-10-31 LAB — CBC
Hematocrit: 45.6 % (ref 37.5–51.0)
Hemoglobin: 15.7 g/dL (ref 13.0–17.7)
MCH: 30.9 pg (ref 26.6–33.0)
MCHC: 34.4 g/dL (ref 31.5–35.7)
MCV: 90 fL (ref 79–97)
Platelets: 200 10*3/uL (ref 150–450)
RBC: 5.08 x10E6/uL (ref 4.14–5.80)
RDW: 12.6 % (ref 11.6–15.4)
WBC: 7 10*3/uL (ref 3.4–10.8)

## 2023-10-31 LAB — LIPID PANEL
Chol/HDL Ratio: 6.1 {ratio} — ABNORMAL HIGH (ref 0.0–5.0)
Cholesterol, Total: 206 mg/dL — ABNORMAL HIGH (ref 100–199)
HDL: 34 mg/dL — ABNORMAL LOW (ref >39)
LDL Chol Calc (NIH): 137 mg/dL — ABNORMAL HIGH (ref 0–99)
Triglycerides: 191 mg/dL — ABNORMAL HIGH (ref 0–149)
VLDL Cholesterol Cal: 35 mg/dL (ref 5–40)

## 2023-10-31 LAB — PSA TOTAL (REFLEX TO FREE): Prostate Specific Ag, Serum: 1.8 ng/mL (ref 0.0–4.0)

## 2023-10-31 LAB — TSH: TSH: 2.03 u[IU]/mL (ref 0.450–4.500)

## 2023-12-05 ENCOUNTER — Encounter: Payer: Self-pay | Admitting: Family Medicine

## 2023-12-05 ENCOUNTER — Ambulatory Visit: Payer: Self-pay | Admitting: Family Medicine

## 2023-12-05 VITALS — BP 120/80 | HR 74 | Temp 98.1°F | Resp 16 | Ht 66.0 in | Wt 198.4 lb

## 2023-12-05 DIAGNOSIS — F411 Generalized anxiety disorder: Secondary | ICD-10-CM

## 2023-12-05 DIAGNOSIS — R262 Difficulty in walking, not elsewhere classified: Secondary | ICD-10-CM

## 2023-12-05 DIAGNOSIS — E7800 Pure hypercholesterolemia, unspecified: Secondary | ICD-10-CM | POA: Diagnosis not present

## 2023-12-05 DIAGNOSIS — Z0001 Encounter for general adult medical examination with abnormal findings: Secondary | ICD-10-CM | POA: Diagnosis not present

## 2023-12-05 DIAGNOSIS — G9389 Other specified disorders of brain: Secondary | ICD-10-CM

## 2023-12-05 DIAGNOSIS — F03B0 Unspecified dementia, moderate, without behavioral disturbance, psychotic disturbance, mood disturbance, and anxiety: Secondary | ICD-10-CM

## 2023-12-05 DIAGNOSIS — B351 Tinea unguium: Secondary | ICD-10-CM

## 2023-12-05 DIAGNOSIS — Z0000 Encounter for general adult medical examination without abnormal findings: Secondary | ICD-10-CM

## 2023-12-05 NOTE — Patient Instructions (Signed)
 [name] Kitchen  Please review the attached list of medications and notify my office if there are any errors.   . Please bring all of your medications to every appointment so we can make sure that our medication list is the same as yours.

## 2023-12-05 NOTE — Progress Notes (Signed)
 Complete physical exam   Patient: Cameron King   DOB: 1945/11/23   78 y.o. Male  MRN: [id number] Visit Date: 12/05/2023  Today's healthcare provider: Mila Merry, MD   Chief Complaint  Patient presents with   Annual Exam    CPE. Flu shot    Subjective    Cameron King is a 78 y.o. male who presents today for a complete physical exam.  He generally feels well. Continue to follow up with Dr. Malvin Johns regarding gait and balance problems as well as anxiety and worsening memory problems. Completed PT in January and has since gotten rollator which he alternates with walking cane to help ambulate. Had all labs done in January which were unremarkable. Reports had eye exam last year with some mild cataracts identifies.   Lab Results  Component Value Date   PSA1 1.8 10/30/2023   PSA1 1.6 12/01/2019   PSA1 1.6 10/07/2018   Lab Results  Component Value Date   CHOL 206 (H) 10/30/2023   HDL 34 (L) 10/30/2023   LDLCALC 137 (H) 10/30/2023   TRIG 191 (H) 10/30/2023   CHOLHDL 6.1 (H) 10/30/2023   Lab Results  Component Value Date   NA 145 (H) 10/30/2023   K 4.2 10/30/2023   CREATININE 1.28 (H) 10/30/2023   EGFR 58 (L) 10/30/2023   GLUCOSE 96 10/30/2023     Past Medical History:  Diagnosis Date   Depression    High cholesterol    Hypertension    Meniere disease    Ocular Migraines    Shingles 07/16/2015   of eye    Past Surgical History:  Procedure Laterality Date   COLONOSCOPY     COLONOSCOPY WITH PROPOFOL N/A 10/29/2018   Procedure: COLONOSCOPY WITH PROPOFOL;  Surgeon: Midge Minium, MD;  Location: [facility];  Service: Endoscopy;  Laterality: N/A;   SIGMOIDOSCOPY  2008   Dr. Evette Cristal. Normal per patient report   Social History   Socioeconomic History   Marital status: Widowed    Spouse name: Not on file   Number of children: 2   Years of education: Not on file   Highest education level: Associate degree: occupational, Scientist, product/process development, or vocational program   Occupational History   Occupation: Retired    Comment: Retired 2009  Tobacco Use   Smoking status: Former    Types: Cigarettes   Smokeless tobacco: Never   Tobacco comments:    quit over 30 years ago  Vaping Use   Vaping status: Never Used  Substance and Sexual Activity   Alcohol use: Yes    Alcohol/week: 0.0 standard drinks of alcohol    Comment: rare wine   Drug use: No   Sexual activity: Not on file  Other Topics Concern   Not on file  Social History Narrative   Pt currently has a girlfriend who has had a heart attack recently. This has added to his stress level.    Social Drivers of Corporate investment banker Strain: Low Risk  (10/30/2023)   Overall Financial Resource Strain (CARDIA)    Difficulty of Paying Living Expenses: Not hard at all  Food Insecurity: No Food Insecurity (10/30/2023)   Hunger Vital Sign    Worried About Running Out of Food in the Last Year: Never true    Ran Out of Food in the Last Year: Never true  Transportation Needs: No Transportation Needs (10/30/2023)   PRAPARE - Administrator, Civil Service (Medical): No  Lack of Transportation (Non-Medical): No  Physical Activity: Sufficiently Active (10/30/2023)   Exercise Vital Sign    Days of Exercise per Week: 5 days    Minutes of Exercise per Session: 40 min  Recent Concern: Physical Activity - Insufficiently Active (10/30/2023)   Exercise Vital Sign    Days of Exercise per Week: 5 days    Minutes of Exercise per Session: 20 min  Stress: No Stress Concern Present (10/30/2023)   [employer] of Occupational Health - Occupational Stress Questionnaire    Feeling of Stress : Not at all  Social Connections: Moderately Isolated (10/30/2023)   Social Connection and Isolation Panel [NHANES]    Frequency of Communication with Friends and Family: More than three times a week    Frequency of Social Gatherings with Friends and Family: More than three times a week    Attends Religious Services: More  than 4 times per year    Active Member of [employer] or Organizations: No    Attends Banker Meetings: Never    Marital Status: Widowed  Intimate Partner Violence: Not At Risk (10/30/2023)   Humiliation, Afraid, Rape, and Kick questionnaire    Fear of Current or Ex-Partner: No    Emotionally Abused: No    Physically Abused: No    Sexually Abused: No   Family Status  Relation Name Status   Father  Deceased       died of esophageal cance   Son  Alive   Son  Alive  No partnership data on file   Family History  Adopted: Yes  Problem Relation Age of Onset   Cancer - Other Father    Stroke Son    Allergies  Allergen Reactions   Prednisone Other (See Comments)    PATIENT HAS HAD IN PAST FEW MONTHS WITH NO PROBLEMS     Patient Care Team: Malva Limes, MD as PCP - General (Family Medicine) Midge Minium, MD as Consulting Physician (Gastroenterology) Morene Crocker, MD as Referring Physician (Neurology) Bud Face, MD as Referring Physician (Otolaryngology) Pa, [facility] (Optometry)   Medications: Outpatient Medications Prior to Visit  Medication Sig   aspirin EC 81 MG tablet Take 81 mg by mouth daily. Swallow whole.   busPIRone (BUSPAR) 15 MG tablet Take 15 mg by mouth 2 (two) times daily.   docusate sodium (COLACE) 50 MG capsule Take 50 mg by mouth every other day. (Patient taking differently: Take 50 mg by mouth daily as needed.)   gabapentin (NEURONTIN) 400 MG capsule Take 400 mg by mouth 3 (three) times daily. TAKE 400 mg twice a day for 1 week, then 400 mg three time a day for 1 week, then 400 mg four times per day. (Patient taking differently: Take 400 mg by mouth 3 (three) times daily. TAKE 400 mg 3x a day for 1 week, then 400 mg three time a day for 1 week, then 400 mg four times per day.)   hydrochlorothiazide (HYDRODIURIL) 25 MG tablet Take 25 mg by mouth daily.   hydrocortisone 2.5 % lotion Apply topically as needed.    ibuprofen  (ADVIL,MOTRIN) 200 MG tablet Take 200 mg by mouth every 6 (six) hours as needed.   loratadine (CLARITIN) 10 MG tablet Take 1 tablet by mouth daily as needed.   memantine (NAMENDA) 5 MG tablet Take 5 mg by mouth 2 (two) times daily.   Multiple Vitamins-Minerals (EQ COMPLETE MULTIVITAMIN-ADULT PO) Take by mouth daily at 6 (six) AM.  naproxen sodium (ALEVE) 220 MG tablet Take 220 mg by mouth as needed.   pravastatin (PRAVACHOL) 40 MG tablet Take 1 tablet (40 mg total) by mouth daily.   sertraline (ZOLOFT) 100 MG tablet Take 1 tablet (100 mg total) by mouth daily. (Patient taking differently: Take 150 mg by mouth daily.)   sildenafil (VIAGRA) 100 MG tablet Take 1 tablet by mouth daily as needed.   No facility-administered medications prior to visit.    Review of Systems  Constitutional:  Negative for appetite change, chills and fever.  Respiratory:  Negative for chest tightness, shortness of breath and wheezing.   Cardiovascular:  Negative for chest pain and palpitations.  Gastrointestinal:  Negative for abdominal pain, nausea and vomiting.      Objective    BP 120/80 (BP Location: Left Arm, Patient Position: Sitting, Cuff Size: Normal)   Pulse 74   Temp 98.1 F (36.7 C) (Oral)   Resp 16   Ht 5\' 6"  (1.676 m)   Wt 198 lb 6.4 oz (90 kg)   SpO2 100%   BMI 32.02 kg/m    Physical Exam   General Appearance:    Mildly obese male. Alert, cooperative, in no acute distress, appears stated age. Slow shuffled gait  Head:    Normocephalic, without obvious abnormality, atraumatic  Eyes:    PERRL, conjunctiva/corneas clear, EOM's intact, fundi    benign, both eyes       Ears:    Normal TM's and external ear canals, both ears  Nose:   Nares normal, septum midline, mucosa normal, no drainage   or sinus tenderness  Throat:   Lips, mucosa, and tongue normal; teeth and gums normal  Neck:   Supple, symmetrical, trachea midline, no adenopathy;       thyroid:  No enlargement/tenderness/nodules; no  carotid   bruit or JVD  Back:     Symmetric, no curvature, ROM normal, no CVA tenderness  Lungs:     Clear to auscultation bilaterally, respirations unlabored  Chest wall:    No tenderness or deformity  Heart:    Normal heart rate. Normal rhythm. No murmurs, rubs, or gallops.  S1 and S2 normal  Abdomen:     Soft, non-tender, bowel sounds active all four quadrants,    no masses, no organomegaly  Genitalia:    deferred  Rectal:    deferred  Extremities:   All extremities are intact. No cyanosis or edema  Pulses:   2+ and symmetric all extremities  Skin:   Skin color, texture, turgor normal, no rashes or lesions. Third toe thickened and dysmorphic.   Lymph nodes:   Cervical, supraclavicular, and axillary nodes normal     Last depression screening scores    12/05/2023   10:17 AM 10/30/2023   10:58 AM 10/30/2023   10:57 AM  PHQ 2/9 Scores  PHQ - 2 Score 2 2 2   PHQ- 9 Score 12 3 3    Last fall risk screening    10/30/2023   11:01 AM  Fall Risk   Falls in the past year? 1  Number falls in past yr: 1  Injury with Fall? 0  Risk for fall due to : Impaired balance/gait  Follow up Falls evaluation completed;Falls prevention discussed   Last Audit-C alcohol use screening    10/30/2023   10:56 AM  Alcohol Use Disorder Test (AUDIT)  1. How often do you have a drink containing alcohol? 0  2. How many drinks containing alcohol do you have on  a typical day when you are drinking? 0  3. How often do you have six or more drinks on one occasion? 0  AUDIT-C Score 0   A score of 3 or more in women, and 4 or more in men indicates increased risk for alcohol abuse, EXCEPT if all of the points are from question 1   No results found for any visits on 12/05/23.  Assessment & Plan    Routine Health Maintenance and Physical Exam  Exercise Activities and Dietary recommendations  Goals      Cut out extra servings     DIET - EAT MORE FRUITS AND VEGETABLES     DIET - INCREASE WATER INTAKE     DIET -  REDUCE PORTION SIZE     Recommend cutting portion sizes in half and eating 3 small meals a day with 2 healthy snacks in between.       Prevent falls     Recommend to remove any items from the home that may cause slips or trips.        Immunization History  Administered Date(s) Administered   Fluad Quad(high Dose 65+) 06/21/2020, 10/05/2021, 06/02/2022   Influenza, High Dose Seasonal PF 07/19/2015, 08/03/2016, 06/28/2017, 06/12/2018   Influenza,inj,Quad PF,6+ Mos 07/28/2019   PFIZER(Purple Top)SARS-COV-2 Vaccination 11/07/2019, 12/03/2019, 08/09/2020   Pneumococcal Conjugate-13 08/24/2014   Pneumococcal Polysaccharide-23 05/01/2012   Td 09/26/2003   Tdap 07/19/2015    Health Maintenance  Topic Date Due   COVID-19 Vaccine (4 - 2024-25 season) 05/27/2023   Colonoscopy  10/30/2023   Medicare Annual Wellness (AWV)  10/29/2024   DTaP/Tdap/Td (3 - Td or Tdap) 07/18/2025   Pneumonia Vaccine 48+ Years old  Completed   INFLUENZA VACCINE  Completed   Hepatitis C Screening  Completed   Zoster Vaccines- Shingrix  Completed   HPV VACCINES  Aged Out    Discussed health benefits of physical activity, and encouraged him to engage in regular exercise appropriate for his age and condition.   2. Cerebral ventriculomegaly   3. Moderate dementia, unspecified dementia type, unspecified whether behavioral, psychotic, or mood disturbance or anxiety (HCC)   4. Difficulty walking   5. GAD (generalized anxiety disorder) Continue regular follow up Dr. Malvin Johns, neurology, as scheduled.   6. Pure hypercholesterolemia He is tolerating pravastatin well with no adverse effects.     7. Onychomycosis  Currently confined just to middle toe. Counseled on potential adverse effects of oral treatment. Will just attempt to contain with topical antifungals for now .   Return in about 6 months (around 06/06/2024).        Mila Merry, MD  [facility] [phone]  (phone) [phone] (fax)  [facility]

## 2024-01-30 ENCOUNTER — Other Ambulatory Visit: Payer: Self-pay | Admitting: Family Medicine

## 2024-01-30 DIAGNOSIS — E7800 Pure hypercholesterolemia, unspecified: Secondary | ICD-10-CM

## 2024-02-12 DIAGNOSIS — R42 Dizziness and giddiness: Secondary | ICD-10-CM | POA: Diagnosis not present

## 2024-02-12 DIAGNOSIS — R32 Unspecified urinary incontinence: Secondary | ICD-10-CM | POA: Diagnosis not present

## 2024-02-12 DIAGNOSIS — R202 Paresthesia of skin: Secondary | ICD-10-CM | POA: Diagnosis not present

## 2024-02-12 DIAGNOSIS — R262 Difficulty in walking, not elsewhere classified: Secondary | ICD-10-CM | POA: Diagnosis not present

## 2024-02-12 DIAGNOSIS — G9389 Other specified disorders of brain: Secondary | ICD-10-CM | POA: Diagnosis not present

## 2024-02-12 DIAGNOSIS — Z9181 History of falling: Secondary | ICD-10-CM | POA: Diagnosis not present

## 2024-02-12 DIAGNOSIS — Z1331 Encounter for screening for depression: Secondary | ICD-10-CM | POA: Diagnosis not present

## 2024-02-12 DIAGNOSIS — R4701 Aphasia: Secondary | ICD-10-CM | POA: Diagnosis not present

## 2024-02-12 DIAGNOSIS — R278 Other lack of coordination: Secondary | ICD-10-CM | POA: Diagnosis not present

## 2024-02-12 DIAGNOSIS — F419 Anxiety disorder, unspecified: Secondary | ICD-10-CM | POA: Diagnosis not present

## 2024-02-12 DIAGNOSIS — R413 Other amnesia: Secondary | ICD-10-CM | POA: Diagnosis not present

## 2024-02-12 DIAGNOSIS — R200 Anesthesia of skin: Secondary | ICD-10-CM | POA: Diagnosis not present

## 2024-04-08 DIAGNOSIS — H90A21 Sensorineural hearing loss, unilateral, right ear, with restricted hearing on the contralateral side: Secondary | ICD-10-CM | POA: Diagnosis not present

## 2024-04-08 DIAGNOSIS — R42 Dizziness and giddiness: Secondary | ICD-10-CM | POA: Diagnosis not present

## 2024-04-08 DIAGNOSIS — F0390 Unspecified dementia without behavioral disturbance: Secondary | ICD-10-CM | POA: Diagnosis not present

## 2024-04-08 DIAGNOSIS — H8101 Meniere's disease, right ear: Secondary | ICD-10-CM | POA: Diagnosis not present

## 2024-05-28 DIAGNOSIS — L821 Other seborrheic keratosis: Secondary | ICD-10-CM | POA: Diagnosis not present

## 2024-05-28 DIAGNOSIS — D2261 Melanocytic nevi of right upper limb, including shoulder: Secondary | ICD-10-CM | POA: Diagnosis not present

## 2024-05-28 DIAGNOSIS — D2271 Melanocytic nevi of right lower limb, including hip: Secondary | ICD-10-CM | POA: Diagnosis not present

## 2024-05-28 DIAGNOSIS — D225 Melanocytic nevi of trunk: Secondary | ICD-10-CM | POA: Diagnosis not present

## 2024-05-28 DIAGNOSIS — D2272 Melanocytic nevi of left lower limb, including hip: Secondary | ICD-10-CM | POA: Diagnosis not present

## 2024-05-28 DIAGNOSIS — D2262 Melanocytic nevi of left upper limb, including shoulder: Secondary | ICD-10-CM | POA: Diagnosis not present

## 2024-06-04 ENCOUNTER — Ambulatory Visit: Admitting: Family Medicine

## 2024-06-25 DIAGNOSIS — S8011XA Contusion of right lower leg, initial encounter: Secondary | ICD-10-CM | POA: Diagnosis not present

## 2024-06-25 DIAGNOSIS — R2241 Localized swelling, mass and lump, right lower limb: Secondary | ICD-10-CM | POA: Diagnosis not present

## 2024-06-25 DIAGNOSIS — M79661 Pain in right lower leg: Secondary | ICD-10-CM | POA: Diagnosis not present

## 2024-06-26 ENCOUNTER — Other Ambulatory Visit: Payer: Self-pay | Admitting: Physician Assistant

## 2024-06-26 ENCOUNTER — Encounter: Payer: Self-pay | Admitting: Physician Assistant

## 2024-06-26 ENCOUNTER — Ambulatory Visit
Admission: RE | Admit: 2024-06-26 | Discharge: 2024-06-26 | Disposition: A | Discharge status: Home / Self Care | Source: Ambulatory Visit | Attending: Physician Assistant | Admitting: Physician Assistant

## 2024-06-26 ENCOUNTER — Ambulatory Visit

## 2024-06-26 DIAGNOSIS — R2241 Localized swelling, mass and lump, right lower limb: Secondary | ICD-10-CM | POA: Diagnosis not present

## 2024-06-26 DIAGNOSIS — M79661 Pain in right lower leg: Secondary | ICD-10-CM | POA: Diagnosis not present

## 2024-07-08 DIAGNOSIS — S8011XA Contusion of right lower leg, initial encounter: Secondary | ICD-10-CM | POA: Diagnosis not present

## 2024-07-15 DIAGNOSIS — S8011XA Contusion of right lower leg, initial encounter: Secondary | ICD-10-CM | POA: Diagnosis not present

## 2024-07-22 DIAGNOSIS — S8011XA Contusion of right lower leg, initial encounter: Secondary | ICD-10-CM | POA: Diagnosis not present

## 2024-07-28 ENCOUNTER — Encounter: Payer: Self-pay | Admitting: Family Medicine

## 2024-07-28 ENCOUNTER — Ambulatory Visit (INDEPENDENT_AMBULATORY_CARE_PROVIDER_SITE_OTHER): Admitting: Family Medicine

## 2024-07-28 VITALS — BP 115/74 | HR 71 | Resp 16 | Wt 195.6 lb

## 2024-07-28 DIAGNOSIS — F32A Depression, unspecified: Secondary | ICD-10-CM

## 2024-07-28 DIAGNOSIS — M79662 Pain in left lower leg: Secondary | ICD-10-CM | POA: Diagnosis not present

## 2024-07-28 DIAGNOSIS — E7800 Pure hypercholesterolemia, unspecified: Secondary | ICD-10-CM | POA: Diagnosis not present

## 2024-07-28 DIAGNOSIS — Z23 Encounter for immunization: Secondary | ICD-10-CM

## 2024-07-28 DIAGNOSIS — F03B0 Unspecified dementia, moderate, without behavioral disturbance, psychotic disturbance, mood disturbance, and anxiety: Secondary | ICD-10-CM

## 2024-07-28 DIAGNOSIS — F419 Anxiety disorder, unspecified: Secondary | ICD-10-CM | POA: Diagnosis not present

## 2024-07-28 DIAGNOSIS — M79661 Pain in right lower leg: Secondary | ICD-10-CM | POA: Diagnosis not present

## 2024-07-28 NOTE — Progress Notes (Signed)
 Established patient visit   Patient: Cameron King   DOB: [birth year]   78 y.o. Male  MRN: [id number] Visit Date: 07/28/2024  Today's healthcare provider: Nancyann Perry, MD   Chief Complaint  Patient presents with   Medical Management of Chronic Issues   Subjective    Discussed the use of AI scribe software for clinical note transcription with the patient, who gave verbal consent to proceed.  History of Present Illness   Cameron King is a 78 year old male who presents for a routine checkup following a recent leg injury. He is accompanied by a family member.  He experienced a fall on September 21, resulting in a backward fall into the tub. Initially, there was concern for a foot fracture, but an MRI cleared him last week, revealing a hematoma instead. He has recently been out of the boot and is awaiting full recovery.  He has been experiencing bilateral calf pain during ambulation for about a year. The pain is sometimes present at rest or when lying down, and he also reports some weakness in his calf muscles.  He is currently taking pravastatin  for cholesterol management and has recently refilled his prescription. Additionally, he is on hydrochlorothiazide for Meniere's disease, which was started by an ENT specialist. His kidney panels were last checked in February.  He has an upcoming appointment with a neurologist, Dr. Lane, who manages his medications for mood and memory.     Lab Results  Component Value Date   CHOL 206 (H) 10/30/2023   HDL 34 (L) 10/30/2023   LDLCALC 137 (H) 10/30/2023   TRIG 191 (H) 10/30/2023   CHOLHDL 6.1 (H) 10/30/2023   Lab Results  Component Value Date   NA 145 (H) 10/30/2023   K 4.2 10/30/2023   CREATININE 1.28 (H) 10/30/2023   EGFR 58 (L) 10/30/2023   GLUCOSE 96 10/30/2023     Medications: Outpatient Medications Prior to Visit  Medication Sig   aspirin EC 81 MG tablet Take 81 mg by mouth daily. Swallow whole.   busPIRone   (BUSPAR ) 15 MG tablet Take 15 mg by mouth 2 (two) times daily.   docusate sodium (COLACE) 50 MG capsule Take 50 mg by mouth every other day. (Patient taking differently: Take 50 mg by mouth daily as needed.)   gabapentin (NEURONTIN) 400 MG capsule Take 400 mg by mouth 3 (three) times daily. TAKE 400 mg twice a day for 1 week, then 400 mg three time a day for 1 week, then 400 mg four times per day. (Patient taking differently: Take 400 mg by mouth 3 (three) times daily. TAKE 400 mg 3x a day for 1 week, then 400 mg three time a day for 1 week, then 400 mg four times per day.)   hydrochlorothiazide (HYDRODIURIL) 25 MG tablet Take 25 mg by mouth daily.   hydrocortisone 2.5 % lotion Apply topically as needed.    ibuprofen (ADVIL,MOTRIN) 200 MG tablet Take 200 mg by mouth every 6 (six) hours as needed.   loratadine (CLARITIN) 10 MG tablet Take 1 tablet by mouth daily as needed.   memantine (NAMENDA) 5 MG tablet Take 5 mg by mouth 2 (two) times daily.   Multiple Vitamins-Minerals (EQ COMPLETE MULTIVITAMIN-ADULT PO) Take by mouth daily at 6 (six) AM.   naproxen  sodium (ALEVE ) 220 MG tablet Take 220 mg by mouth as needed.   pravastatin  (PRAVACHOL ) 40 MG tablet Take 1 tablet (40 mg total) by mouth daily.  sertraline  (ZOLOFT ) 100 MG tablet Take 1 tablet (100 mg total) by mouth daily. (Patient taking differently: Take 150 mg by mouth daily.)   sildenafil (VIAGRA) 100 MG tablet Take 1 tablet by mouth daily as needed.   No facility-administered medications prior to visit.   Review of Systems  Constitutional:  Negative for appetite change, chills and fever.  Respiratory:  Negative for chest tightness, shortness of breath and wheezing.   Cardiovascular:  Negative for chest pain and palpitations.  Gastrointestinal:  Negative for abdominal pain, nausea and vomiting.       Objective    BP 115/74 (BP Location: Left Arm, Patient Position: Sitting, Cuff Size: Normal)   Pulse 71   Resp 16   Wt 195 lb 9.6 oz  (88.7 kg)   SpO2 97%   BMI 31.57 kg/m   Physical Exam   General: Appearance:    Obese male in no acute distress  Eyes:    PERRL, conjunctiva/corneas clear, EOM's intact       Lungs:     Clear to auscultation bilaterally, respirations unlabored  Heart:    Normal heart rate. Normal rhythm. No murmurs, rubs, or gallops.    MS:   All extremities are intact.  No calf pain or tenderness,   Neurologic:   Awake, alert, oriented x 2. No apparent focal neurological defect.         Assessment & Plan    1. Bilateral calf pain (Primary)  - CK - US  ARTERIAL ABI (SCREENING LOWER EXTREMITY); Future  2. Moderate dementia, unspecified dementia type, unspecified whether behavioral, psychotic, or mood disturbance or anxiety (HCC) Stable on current medications. Follow up neurology this month as scheduled.   3. Pure hypercholesterolemia He is tolerating pravastatin  well with no adverse effects.   - Lipid panel - Renal function panel  4. Anxiety, mild Continue current medications managed by Dr. Lane.   5. Depressive disorder Continue sertraline .   6. Influenza vaccine needed  - Flu vaccine HIGH DOSE PF(Fluzone Trivalent)       Nancyann Perry, MD  [facility] [phone] (phone) [phone] (fax)  [facility]

## 2024-07-29 ENCOUNTER — Ambulatory Visit: Payer: Self-pay | Admitting: Family Medicine

## 2024-07-29 LAB — RENAL FUNCTION PANEL
Albumin: 4.6 g/dL (ref 3.8–4.8)
BUN/Creatinine Ratio: 21 (ref 10–24)
BUN: 23 mg/dL (ref 8–27)
CO2: 26 mmol/L (ref 20–29)
Calcium: 9.6 mg/dL (ref 8.6–10.2)
Chloride: 103 mmol/L (ref 96–106)
Creatinine, Ser: 1.08 mg/dL (ref 0.76–1.27)
Glucose: 83 mg/dL (ref 70–99)
Phosphorus: 2.5 mg/dL — ABNORMAL LOW (ref 2.8–4.1)
Potassium: 4.6 mmol/L (ref 3.5–5.2)
Sodium: 144 mmol/L (ref 134–144)
eGFR: 71 mL/min/1.73 (ref >59)

## 2024-07-29 LAB — LIPID PANEL
Chol/HDL Ratio: 4.7 ratio (ref 0.0–5.0)
Cholesterol, Total: 163 mg/dL (ref 100–199)
HDL: 35 mg/dL — ABNORMAL LOW (ref >39)
LDL Chol Calc (NIH): 101 mg/dL — ABNORMAL HIGH (ref 0–99)
Triglycerides: 151 mg/dL — ABNORMAL HIGH (ref 0–149)
VLDL Cholesterol Cal: 27 mg/dL (ref 5–40)

## 2024-07-29 LAB — CK: Total CK: 123 U/L (ref 41–331)

## 2024-07-30 DIAGNOSIS — H8101 Meniere's disease, right ear: Secondary | ICD-10-CM | POA: Diagnosis not present

## 2024-07-31 ENCOUNTER — Ambulatory Visit
Admission: RE | Admit: 2024-07-31 | Discharge: 2024-07-31 | Disposition: A | Discharge status: Home / Self Care | Source: Ambulatory Visit | Attending: Family Medicine | Admitting: Family Medicine

## 2024-07-31 DIAGNOSIS — M79662 Pain in left lower leg: Secondary | ICD-10-CM | POA: Insufficient documentation

## 2024-07-31 DIAGNOSIS — M79661 Pain in right lower leg: Secondary | ICD-10-CM | POA: Insufficient documentation

## 2024-08-11 ENCOUNTER — Other Ambulatory Visit: Payer: Self-pay

## 2024-08-11 DIAGNOSIS — Z9181 History of falling: Secondary | ICD-10-CM | POA: Diagnosis not present

## 2024-08-11 DIAGNOSIS — H9193 Unspecified hearing loss, bilateral: Secondary | ICD-10-CM | POA: Diagnosis not present

## 2024-08-11 DIAGNOSIS — R200 Anesthesia of skin: Secondary | ICD-10-CM | POA: Diagnosis not present

## 2024-08-11 DIAGNOSIS — R32 Unspecified urinary incontinence: Secondary | ICD-10-CM | POA: Diagnosis not present

## 2024-08-11 DIAGNOSIS — G9389 Other specified disorders of brain: Secondary | ICD-10-CM | POA: Diagnosis not present

## 2024-08-11 DIAGNOSIS — R278 Other lack of coordination: Secondary | ICD-10-CM | POA: Diagnosis not present

## 2024-08-11 DIAGNOSIS — F419 Anxiety disorder, unspecified: Secondary | ICD-10-CM | POA: Diagnosis not present

## 2024-08-11 DIAGNOSIS — R262 Difficulty in walking, not elsewhere classified: Secondary | ICD-10-CM | POA: Diagnosis not present

## 2024-08-11 DIAGNOSIS — R42 Dizziness and giddiness: Secondary | ICD-10-CM | POA: Diagnosis not present

## 2024-08-11 DIAGNOSIS — R4701 Aphasia: Secondary | ICD-10-CM | POA: Diagnosis not present

## 2024-08-11 DIAGNOSIS — R202 Paresthesia of skin: Secondary | ICD-10-CM | POA: Diagnosis not present

## 2024-08-11 DIAGNOSIS — R413 Other amnesia: Secondary | ICD-10-CM | POA: Diagnosis not present

## 2024-08-11 MED ORDER — SERTRALINE HCL 100 MG PO TABS
100.0000 mg | ORAL_TABLET | Freq: Every day | ORAL | 0 refills | Status: AC
  Filled 2024-08-11: qty 135, 90d supply, fill #0

## 2024-08-11 MED ORDER — MEMANTINE HCL 5 MG PO TABS
5.0000 mg | ORAL_TABLET | Freq: Two times a day (BID) | ORAL | 1 refills | Status: AC
  Filled 2024-08-11 – 2024-08-15 (×2): qty 180, 90d supply, fill #0

## 2024-08-11 MED ORDER — BUSPIRONE HCL 15 MG PO TABS
15.0000 mg | ORAL_TABLET | Freq: Two times a day (BID) | ORAL | 1 refills | Status: AC
  Filled 2024-08-11 – 2024-10-22 (×2): qty 180, 90d supply, fill #0

## 2024-08-11 MED ORDER — RIVASTIGMINE 4.6 MG/24HR TD PT24
4.6000 mg | MEDICATED_PATCH | Freq: Every day | TRANSDERMAL | 1 refills | Status: AC
  Filled 2024-08-11 – 2024-10-22 (×2): qty 30, 30d supply, fill #0

## 2024-08-11 MED ORDER — GABAPENTIN 400 MG PO CAPS
400.0000 mg | ORAL_CAPSULE | Freq: Three times a day (TID) | ORAL | 0 refills | Status: AC
  Filled 2024-08-29: qty 270, 90d supply, fill #0

## 2024-08-14 ENCOUNTER — Other Ambulatory Visit: Payer: Self-pay

## 2024-08-14 MED ORDER — DIVALPROEX SODIUM 125 MG PO DR TAB
DELAYED_RELEASE_TABLET | ORAL | 1 refills | Status: DC
  Filled 2024-08-14: qty 60, 33d supply, fill #0
  Filled 2024-09-19: qty 60, 30d supply, fill #1

## 2024-08-14 MED ORDER — RIVASTIGMINE 4.6 MG/24HR TD PT24
4.6000 mg | MEDICATED_PATCH | Freq: Every day | TRANSDERMAL | 1 refills | Status: AC
  Filled 2024-08-14: qty 30, 30d supply, fill #0
  Filled 2024-09-19: qty 30, 30d supply, fill #1

## 2024-08-15 ENCOUNTER — Other Ambulatory Visit: Payer: Self-pay

## 2024-08-18 ENCOUNTER — Other Ambulatory Visit: Payer: Self-pay

## 2024-08-29 ENCOUNTER — Other Ambulatory Visit: Payer: Self-pay

## 2024-09-19 ENCOUNTER — Other Ambulatory Visit: Payer: Self-pay

## 2024-09-22 ENCOUNTER — Other Ambulatory Visit: Payer: Self-pay

## 2024-10-22 ENCOUNTER — Other Ambulatory Visit: Payer: Self-pay

## 2024-10-22 ENCOUNTER — Encounter: Payer: Self-pay | Admitting: Family Medicine

## 2024-10-22 DIAGNOSIS — E7800 Pure hypercholesterolemia, unspecified: Secondary | ICD-10-CM

## 2024-10-22 MED ORDER — DIVALPROEX SODIUM 125 MG PO DR TAB
DELAYED_RELEASE_TABLET | ORAL | 1 refills | Status: AC
  Filled 2024-10-22: qty 60, 30d supply, fill #0

## 2024-10-22 MED ORDER — TRIAMTERENE-HCTZ 37.5-25 MG PO TABS
1.0000 | ORAL_TABLET | Freq: Every day | ORAL | 6 refills | Status: AC
  Filled 2024-10-22: qty 30, 30d supply, fill #0

## 2024-10-23 MED ORDER — ATORVASTATIN CALCIUM 20 MG PO TABS: 20.0000 mg | ORAL_TABLET | Freq: Every day | ORAL | 2 refills | Status: AC

## 2024-10-29 ENCOUNTER — Ambulatory Visit: Admission: RE | Admit: 2024-10-29 | Discharge: 2024-10-29 | Disposition: A | Source: Home / Self Care

## 2024-10-29 ENCOUNTER — Ambulatory Visit: Admission: RE | Admit: 2024-10-29 | Discharge: 2024-10-29 | Disposition: A | Source: Ambulatory Visit

## 2024-10-29 ENCOUNTER — Ambulatory Visit: Payer: Self-pay

## 2024-10-29 ENCOUNTER — Ambulatory Visit

## 2024-10-29 VITALS — BP 111/74 | HR 77 | Temp 97.5°F | Wt 197.4 lb

## 2024-10-29 DIAGNOSIS — W19XXXA Unspecified fall, initial encounter: Secondary | ICD-10-CM

## 2024-10-29 DIAGNOSIS — M7021 Olecranon bursitis, right elbow: Secondary | ICD-10-CM

## 2024-10-29 NOTE — Patient Instructions (Signed)
 [employer]si.7nVdmHRLsfsaU8kvx3hokRKYgYqLJIfy9JVSzZS5MyA2EXx6b8J6tSVYTsuIiuhVLWxtLOa_b5tTiGMkIfzly1Llj5X8ew3tJmH0IDNbtSRfxNGLgzUok4z6eZWwvphYTgLhRE86peFA0N9IwnD_-lSoPDn5_Ds1Wd_yEmK74Uznq_RBBYB27ObtZZGz5el-k1RgeqkEh8WZYDMLnF0RuGXy_zA6QyIIG919ZJAeDbLmzR-vOZ3_dLpZMSoYwGlR3MSS74ZfGfs5yaNSWDWxWOQVAnGH5VGcLY1-Y9C2cbY.iEtpCiVcA6_iBzSfIqTcQoQANgyIW6VR8sPCkDcl_4Y&dib_tag=se&keywords=elbow%2Bbursitis%2Bcompression%2Bsleeve&qid=[phone]&s=hpc&sprefix=elbow%2Bbursitis%2B%2Chpc%2C168&sr=1-4&th=1

## 2024-10-29 NOTE — Telephone Encounter (Signed)
 FYI Only or Action Required?: FYI only for provider: appointment scheduled on 2/4.  Patient was last seen in primary care on 07/28/2024 by Gasper Nancyann BRAVO, MD.  Called Nurse Triage reporting Arm Swelling.  Symptoms began today.  Interventions attempted: Nothing.  Symptoms are: stable.  Triage Disposition: See Physician Within 24 Hours  Patient/caregiver understands and will follow disposition?: Yes     Reason for Triage: swollen lump on elbow, size of goose egg, fluid filled pretty painful Reason for Disposition  SEVERE joint swelling (e.g., can barely bend or move elbow joint)  Answer Assessment - Initial Assessment Questions 1. LOCATION: Where is the swelling? (e.g., left, right, both elbows)     Bony part of R elbow  2. SIZE and DESCRIPTION: What does the swelling look like? (e.g., entire elbow, localized)     Size of a goose egg... fluid filled per daughter of pt  3. ONSET: When did the swelling start? Does it come and go, or is it there all the time?     Maybe a couple hours ago, I think he woke up like that. He's got mild-moderate dementia   6. ASSOCIATED SYMPTOMS: Is there any pain or redness?     No pain or bruising per daughter  Protocols used: Elbow Swelling-A-AH

## 2024-10-29 NOTE — Progress Notes (Signed)
 "     Acute visit   Patient: Cameron King   DOB: Jun 20, 1946   79 y.o. Male  MRN: [id number] PCP: Gasper Nancyann FORBES, MD   Chief Complaint  Patient presents with   Edema    Right elbow some pain  Does not know how it happened says a fall out of bed.    Subjective    Discussed the use of AI scribe software for clinical note transcription with the patient, who gave verbal consent to proceed.  History of Present Illness Cameron King is a 79 year old male with history of dementia who presents with elbow swelling. He is accompanied by a caregiver.  He noticed swelling in his elbow upon waking up this morning. He describes a 'big knot' on the elbow. He is unsure of the exact cause but recalls trying to get up from the floor and possibly hitting the elbow on a metal armrest of a chair. He does not remember falling, but it is possible. Patient's caregiver did not witness fall.  He is currently taking Aleve  as needed, which he last took at lunchtime. He reports soreness down the arm.   Review of systems as noted in HPI.   Objective    BP 111/74   Pulse 77   Temp (!) 97.5 F (36.4 C) (Oral)   Wt 197 lb 6.4 oz (89.5 kg)   SpO2 97%   BMI 31.86 kg/m  Physical Exam Constitutional:      Appearance: Normal appearance.  HENT:     Head: Normocephalic and atraumatic.     Mouth/Throat:     Mouth: Mucous membranes are moist.  Eyes:     Pupils: Pupils are equal, round, and reactive to light.  Pulmonary:     Effort: Pulmonary effort is normal.  Musculoskeletal:     Right elbow: Swelling present. Normal range of motion. No tenderness.     Left elbow: Normal.     Comments: Fluctuant mass present at the olecranon process. No warmth, erythema, or tenderness. Normal ROM. Strength 5/5. No bruising present.   Skin:    General: Skin is warm.  Neurological:     General: No focal deficit present.     Mental Status: He is alert.       No results found for any visits on 10/29/24.   Assessment & Plan     Problem List Items Addressed This Visit   None Visit Diagnoses       Olecranon bursitis of right elbow    -  Primary   Relevant Orders   DG Elbow 2 Views Right     Fall, initial encounter       Relevant Orders   DG Elbow 2 Views Right      Assessment & Plan Olecranon bursitis of right elbow Swelling present at the olecranon process of the right elbow, likely due to olecranon bursitis. No signs of infection such as warmth or tenderness. No pain on exam. Low likelihood of fracture, but x-ray offered to rule out any fractures given concern for possible fall and patient's hx of dementia. - Recommended wearing a compression sleeve for the elbow to aid in fluid resorption. - Advised taking Aleve  as needed to reduce inflammation. - Provided elbow exercises - Ordered x-ray of the right elbow to rule out fractures. - Instructed to seek care at orthopedic urgent care if the elbow becomes red, painful, or continues to swell for potential aspiration  Fall Patient with possible fall  onto the R elbow today. Unwitnessed by caregiver. No significant pain or injury noted. - Ordered x-ray of the right elbow to ensure no fractures from the fall.  No orders of the defined types were placed in this encounter.    No follow-ups on file.      Isaiah DELENA Pepper, MD  [facility] [phone] (phone) [phone] (fax) "

## 2024-10-31 ENCOUNTER — Ambulatory Visit: Payer: Self-pay
